# Patient Record
Sex: Female | Born: 1955
Health system: Southern US, Community
[De-identification: ages and names within clinical notes are randomized; demographics above are authoritative.]

## PROBLEM LIST (undated history)

## (undated) DIAGNOSIS — T7840XA Allergy, unspecified, initial encounter: Secondary | ICD-10-CM

## (undated) DIAGNOSIS — R112 Nausea with vomiting, unspecified: Secondary | ICD-10-CM

## (undated) DIAGNOSIS — Z9889 Other specified postprocedural states: Secondary | ICD-10-CM

## (undated) HISTORY — PX: OOPHORECTOMY: SHX86

## (undated) HISTORY — DX: Allergy, unspecified, initial encounter: T78.40XA

## (undated) HISTORY — PX: VAGINAL HYSTERECTOMY: SHX2639

---

## 1997-12-19 ENCOUNTER — Other Ambulatory Visit: Admission: RE | Admit: 1997-12-19 | Discharge: 1997-12-19 | Payer: Self-pay | Admitting: *Deleted

## 1998-01-15 ENCOUNTER — Ambulatory Visit (HOSPITAL_COMMUNITY): Admission: RE | Admit: 1998-01-15 | Discharge: 1998-01-15 | Payer: Self-pay | Admitting: *Deleted

## 1999-01-21 ENCOUNTER — Other Ambulatory Visit: Admission: RE | Admit: 1999-01-21 | Discharge: 1999-01-21 | Payer: Self-pay | Admitting: *Deleted

## 1999-01-24 ENCOUNTER — Ambulatory Visit (HOSPITAL_COMMUNITY): Admission: RE | Admit: 1999-01-24 | Discharge: 1999-01-24 | Payer: Self-pay | Admitting: *Deleted

## 1999-01-24 ENCOUNTER — Encounter: Payer: Self-pay | Admitting: *Deleted

## 2000-02-06 ENCOUNTER — Other Ambulatory Visit: Admission: RE | Admit: 2000-02-06 | Discharge: 2000-02-06 | Payer: Self-pay | Admitting: *Deleted

## 2000-02-06 ENCOUNTER — Ambulatory Visit (HOSPITAL_COMMUNITY): Admission: RE | Admit: 2000-02-06 | Discharge: 2000-02-06 | Payer: Self-pay | Admitting: *Deleted

## 2000-02-06 ENCOUNTER — Encounter: Payer: Self-pay | Admitting: *Deleted

## 2000-11-09 HISTORY — PX: BREAST BIOPSY: SHX20

## 2001-02-08 ENCOUNTER — Encounter: Payer: Self-pay | Admitting: *Deleted

## 2001-02-08 ENCOUNTER — Ambulatory Visit (HOSPITAL_COMMUNITY): Admission: RE | Admit: 2001-02-08 | Discharge: 2001-02-08 | Payer: Self-pay | Admitting: *Deleted

## 2001-02-18 ENCOUNTER — Other Ambulatory Visit: Admission: RE | Admit: 2001-02-18 | Discharge: 2001-02-18 | Payer: Self-pay | Admitting: *Deleted

## 2002-03-10 ENCOUNTER — Ambulatory Visit (HOSPITAL_COMMUNITY): Admission: RE | Admit: 2002-03-10 | Discharge: 2002-03-10 | Payer: Self-pay | Admitting: *Deleted

## 2002-03-10 ENCOUNTER — Encounter: Payer: Self-pay | Admitting: *Deleted

## 2002-05-02 ENCOUNTER — Other Ambulatory Visit: Admission: RE | Admit: 2002-05-02 | Discharge: 2002-05-02 | Payer: Self-pay | Admitting: *Deleted

## 2003-03-15 ENCOUNTER — Ambulatory Visit (HOSPITAL_COMMUNITY): Admission: RE | Admit: 2003-03-15 | Discharge: 2003-03-15 | Payer: Self-pay | Admitting: *Deleted

## 2003-03-15 ENCOUNTER — Encounter: Payer: Self-pay | Admitting: *Deleted

## 2003-05-08 ENCOUNTER — Other Ambulatory Visit: Admission: RE | Admit: 2003-05-08 | Discharge: 2003-05-08 | Payer: Self-pay | Admitting: *Deleted

## 2004-03-25 ENCOUNTER — Ambulatory Visit (HOSPITAL_COMMUNITY): Admission: RE | Admit: 2004-03-25 | Discharge: 2004-03-25 | Payer: Self-pay | Admitting: *Deleted

## 2004-05-08 ENCOUNTER — Other Ambulatory Visit: Admission: RE | Admit: 2004-05-08 | Discharge: 2004-05-08 | Payer: Self-pay | Admitting: *Deleted

## 2004-07-26 ENCOUNTER — Ambulatory Visit: Payer: Self-pay | Admitting: Family Medicine

## 2005-04-02 ENCOUNTER — Ambulatory Visit (HOSPITAL_COMMUNITY): Admission: RE | Admit: 2005-04-02 | Discharge: 2005-04-02 | Payer: Self-pay | Admitting: *Deleted

## 2005-06-04 ENCOUNTER — Other Ambulatory Visit: Admission: RE | Admit: 2005-06-04 | Discharge: 2005-06-04 | Payer: Self-pay | Admitting: *Deleted

## 2005-07-07 ENCOUNTER — Ambulatory Visit: Payer: Self-pay | Admitting: Internal Medicine

## 2005-09-23 ENCOUNTER — Ambulatory Visit: Payer: Self-pay | Admitting: Family Medicine

## 2005-11-25 ENCOUNTER — Ambulatory Visit: Payer: Self-pay | Admitting: Family Medicine

## 2006-04-28 ENCOUNTER — Ambulatory Visit (HOSPITAL_COMMUNITY): Admission: RE | Admit: 2006-04-28 | Discharge: 2006-04-28 | Payer: Self-pay | Admitting: *Deleted

## 2006-06-01 ENCOUNTER — Other Ambulatory Visit: Admission: RE | Admit: 2006-06-01 | Discharge: 2006-06-01 | Payer: Self-pay | Admitting: *Deleted

## 2006-10-19 ENCOUNTER — Ambulatory Visit: Payer: Self-pay | Admitting: Family Medicine

## 2007-02-16 ENCOUNTER — Ambulatory Visit: Payer: Self-pay | Admitting: Family Medicine

## 2007-02-16 DIAGNOSIS — J029 Acute pharyngitis, unspecified: Secondary | ICD-10-CM | POA: Insufficient documentation

## 2007-02-16 DIAGNOSIS — IMO0002 Reserved for concepts with insufficient information to code with codable children: Secondary | ICD-10-CM | POA: Insufficient documentation

## 2007-02-17 ENCOUNTER — Telehealth (INDEPENDENT_AMBULATORY_CARE_PROVIDER_SITE_OTHER): Payer: Self-pay | Admitting: *Deleted

## 2007-03-17 ENCOUNTER — Encounter: Payer: Self-pay | Admitting: Family Medicine

## 2007-03-22 ENCOUNTER — Encounter: Payer: Self-pay | Admitting: Family Medicine

## 2007-05-18 ENCOUNTER — Ambulatory Visit (HOSPITAL_COMMUNITY): Admission: RE | Admit: 2007-05-18 | Discharge: 2007-05-18 | Payer: Self-pay | Admitting: *Deleted

## 2007-05-26 ENCOUNTER — Other Ambulatory Visit: Admission: RE | Admit: 2007-05-26 | Discharge: 2007-05-26 | Payer: Self-pay | Admitting: *Deleted

## 2007-06-25 ENCOUNTER — Ambulatory Visit: Payer: Self-pay | Admitting: Internal Medicine

## 2007-06-25 DIAGNOSIS — J019 Acute sinusitis, unspecified: Secondary | ICD-10-CM | POA: Insufficient documentation

## 2007-07-16 ENCOUNTER — Telehealth (INDEPENDENT_AMBULATORY_CARE_PROVIDER_SITE_OTHER): Payer: Self-pay | Admitting: *Deleted

## 2007-07-16 ENCOUNTER — Encounter: Admission: RE | Admit: 2007-07-16 | Discharge: 2007-07-16 | Payer: Self-pay | Admitting: Family Medicine

## 2007-07-16 ENCOUNTER — Ambulatory Visit: Payer: Self-pay | Admitting: Family Medicine

## 2008-02-18 ENCOUNTER — Ambulatory Visit: Payer: Self-pay | Admitting: Family Medicine

## 2008-02-18 DIAGNOSIS — N390 Urinary tract infection, site not specified: Secondary | ICD-10-CM | POA: Insufficient documentation

## 2008-02-18 LAB — CONVERTED CEMR LAB
Bilirubin Urine: NEGATIVE
Ketones, urine, test strip: NEGATIVE
Nitrite: NEGATIVE
pH: 6

## 2008-02-21 ENCOUNTER — Telehealth (INDEPENDENT_AMBULATORY_CARE_PROVIDER_SITE_OTHER): Payer: Self-pay | Admitting: *Deleted

## 2008-04-28 ENCOUNTER — Ambulatory Visit: Payer: Self-pay | Admitting: *Deleted

## 2008-05-18 ENCOUNTER — Ambulatory Visit (HOSPITAL_COMMUNITY): Admission: RE | Admit: 2008-05-18 | Discharge: 2008-05-18 | Payer: Self-pay | Admitting: Family Medicine

## 2008-05-26 ENCOUNTER — Encounter (INDEPENDENT_AMBULATORY_CARE_PROVIDER_SITE_OTHER): Payer: Self-pay | Admitting: *Deleted

## 2008-06-12 ENCOUNTER — Encounter: Payer: Self-pay | Admitting: Family Medicine

## 2008-06-13 ENCOUNTER — Other Ambulatory Visit: Admission: RE | Admit: 2008-06-13 | Discharge: 2008-06-13 | Payer: Self-pay | Admitting: Family Medicine

## 2008-06-13 ENCOUNTER — Encounter: Payer: Self-pay | Admitting: Family Medicine

## 2008-06-13 ENCOUNTER — Ambulatory Visit: Payer: Self-pay | Admitting: Family Medicine

## 2008-06-13 DIAGNOSIS — J309 Allergic rhinitis, unspecified: Secondary | ICD-10-CM | POA: Insufficient documentation

## 2008-06-13 LAB — CONVERTED CEMR LAB
Glucose, Urine, Semiquant: NEGATIVE
Nitrite: NEGATIVE
Specific Gravity, Urine: <1.005
WBC Urine, dipstick: NEGATIVE
pH: 5

## 2008-06-13 LAB — HM PAP SMEAR: HM Pap smear: NEGATIVE

## 2008-06-16 ENCOUNTER — Encounter (INDEPENDENT_AMBULATORY_CARE_PROVIDER_SITE_OTHER): Payer: Self-pay | Admitting: *Deleted

## 2008-06-18 LAB — CONVERTED CEMR LAB
ALT: 17 units/L (ref 0–35)
BUN: 14 mg/dL (ref 6–23)
Basophils Relative: 0.3 % (ref 0.0–3.0)
Bilirubin, Direct: 0.1 mg/dL (ref 0.0–0.3)
CO2: 28 meq/L (ref 19–32)
Calcium: 8.8 mg/dL (ref 8.4–10.5)
Chloride: 104 meq/L (ref 96–112)
Cholesterol: 178 mg/dL (ref 0–200)
Creatinine, Ser: 0.8 mg/dL (ref 0.4–1.2)
Eosinophils Absolute: 0.1 10*3/uL (ref 0.0–0.7)
Eosinophils Relative: 2.2 % (ref 0.0–5.0)
GFR calc Af Amer: 97 mL/min
GFR calc non Af Amer: 80 mL/min
Glucose, Bld: 80 mg/dL (ref 70–99)
HCT: 44.1 % (ref 36.0–46.0)
Lymphocytes Relative: 37.3 % (ref 12.0–46.0)
MCHC: 35 g/dL (ref 30.0–36.0)
MCV: 97.2 fL (ref 78.0–100.0)
Monocytes Absolute: 0.4 10*3/uL (ref 0.1–1.0)
Platelets: 248 10*3/uL (ref 150–400)
Potassium: 4 meq/L (ref 3.5–5.1)
Total Bilirubin: 0.9 mg/dL (ref 0.3–1.2)
VLDL: 16 mg/dL (ref 0–40)

## 2008-08-23 ENCOUNTER — Telehealth: Payer: Self-pay | Admitting: Family Medicine

## 2008-08-30 ENCOUNTER — Telehealth (INDEPENDENT_AMBULATORY_CARE_PROVIDER_SITE_OTHER): Payer: Self-pay | Admitting: *Deleted

## 2008-09-19 ENCOUNTER — Ambulatory Visit: Payer: Self-pay | Admitting: Family Medicine

## 2008-12-04 ENCOUNTER — Ambulatory Visit: Payer: Self-pay | Admitting: Family Medicine

## 2008-12-04 DIAGNOSIS — N926 Irregular menstruation, unspecified: Secondary | ICD-10-CM | POA: Insufficient documentation

## 2008-12-07 ENCOUNTER — Encounter: Payer: Self-pay | Admitting: Internal Medicine

## 2008-12-14 ENCOUNTER — Ambulatory Visit: Payer: Self-pay | Admitting: Family Medicine

## 2008-12-14 DIAGNOSIS — N939 Abnormal uterine and vaginal bleeding, unspecified: Secondary | ICD-10-CM

## 2008-12-14 DIAGNOSIS — N926 Irregular menstruation, unspecified: Secondary | ICD-10-CM | POA: Insufficient documentation

## 2008-12-15 ENCOUNTER — Encounter: Payer: Self-pay | Admitting: Family Medicine

## 2008-12-17 LAB — CONVERTED CEMR LAB
Basophils Absolute: 0 10*3/uL (ref 0.0–0.1)
Basophils Relative: 0 % (ref 0.0–3.0)
HCT: 39.7 % (ref 36.0–46.0)
MCV: 97 fL (ref 78.0–100.0)
Monocytes Absolute: 0.3 10*3/uL (ref 0.1–1.0)
Neutrophils Relative %: 61.3 % (ref 43.0–77.0)
Platelets: 223 10*3/uL (ref 150.0–400.0)
RDW: 12.6 % (ref 11.5–14.6)
Transferrin: 301.9 mg/dL (ref 212.0–360.0)

## 2008-12-18 ENCOUNTER — Encounter (INDEPENDENT_AMBULATORY_CARE_PROVIDER_SITE_OTHER): Payer: Self-pay | Admitting: *Deleted

## 2009-02-26 ENCOUNTER — Ambulatory Visit (HOSPITAL_COMMUNITY): Admission: RE | Admit: 2009-02-26 | Discharge: 2009-02-27 | Payer: Self-pay | Admitting: Obstetrics and Gynecology

## 2009-02-26 ENCOUNTER — Encounter (INDEPENDENT_AMBULATORY_CARE_PROVIDER_SITE_OTHER): Payer: Self-pay | Admitting: Obstetrics and Gynecology

## 2009-09-06 ENCOUNTER — Ambulatory Visit: Payer: Self-pay | Admitting: Internal Medicine

## 2010-02-12 ENCOUNTER — Ambulatory Visit: Payer: Self-pay | Admitting: Family Medicine

## 2010-02-12 DIAGNOSIS — R209 Unspecified disturbances of skin sensation: Secondary | ICD-10-CM | POA: Insufficient documentation

## 2010-02-12 DIAGNOSIS — R609 Edema, unspecified: Secondary | ICD-10-CM | POA: Insufficient documentation

## 2010-02-13 ENCOUNTER — Encounter: Payer: Self-pay | Admitting: Family Medicine

## 2010-02-15 ENCOUNTER — Encounter: Payer: Self-pay | Admitting: Family Medicine

## 2010-02-15 ENCOUNTER — Telehealth (INDEPENDENT_AMBULATORY_CARE_PROVIDER_SITE_OTHER): Payer: Self-pay | Admitting: *Deleted

## 2010-02-15 LAB — CONVERTED CEMR LAB
AST: 26 units/L (ref 0–37)
Albumin: 3.8 g/dL (ref 3.5–5.2)
BUN: 18 mg/dL (ref 6–23)
Basophils Absolute: 0 10*3/uL (ref 0.0–0.1)
Bilirubin, Direct: 0.1 mg/dL (ref 0.0–0.3)
CO2: 28 meq/L (ref 19–32)
Calcium: 9 mg/dL (ref 8.4–10.5)
Chloride: 108 meq/L (ref 96–112)
Eosinophils Absolute: 0.1 10*3/uL (ref 0.0–0.7)
Eosinophils Relative: 2 % (ref 0.0–5.0)
Free T4: 0.91 ng/dL (ref 0.60–1.60)
GFR calc non Af Amer: 83.23 mL/min (ref >60)
Glucose, Bld: 83 mg/dL (ref 70–99)
Lymphs Abs: 1.9 10*3/uL (ref 0.7–4.0)
MCHC: 34.9 g/dL (ref 30.0–36.0)
Monocytes Relative: 14.6 % — ABNORMAL HIGH (ref 3.0–12.0)
Neutro Abs: 1.5 10*3/uL (ref 1.4–7.7)
Neutrophils Relative %: 36.8 % — ABNORMAL LOW (ref 43.0–77.0)
Potassium: 4.1 meq/L (ref 3.5–5.1)
RDW: 13.9 % (ref 11.5–14.6)
Sodium: 142 meq/L (ref 135–145)
TSH: 2.39 microintl units/mL (ref 0.35–5.50)
Total Protein: 6.7 g/dL (ref 6.0–8.3)

## 2010-02-19 ENCOUNTER — Telehealth (INDEPENDENT_AMBULATORY_CARE_PROVIDER_SITE_OTHER): Payer: Self-pay | Admitting: *Deleted

## 2010-02-19 LAB — CONVERTED CEMR LAB
Rhuematoid fact SerPl-aCnc: <20 intl units/mL (ref 0–20)
Vit D, 25-Hydroxy: 31 ng/mL (ref 30–89)

## 2010-02-26 ENCOUNTER — Ambulatory Visit: Payer: Self-pay | Admitting: Family Medicine

## 2010-02-27 DIAGNOSIS — R946 Abnormal results of thyroid function studies: Secondary | ICD-10-CM | POA: Insufficient documentation

## 2010-02-28 LAB — CONVERTED CEMR LAB: Free T4: 1.21 ng/dL (ref 0.60–1.60)

## 2010-03-14 ENCOUNTER — Ambulatory Visit: Payer: Self-pay | Admitting: Endocrinology

## 2010-03-14 DIAGNOSIS — E069 Thyroiditis, unspecified: Secondary | ICD-10-CM | POA: Insufficient documentation

## 2010-03-14 LAB — CONVERTED CEMR LAB: Vitamin B-12: 407 pg/mL (ref 211–911)

## 2010-03-15 LAB — CONVERTED CEMR LAB: Calcium, Total (PTH): 9.2 mg/dL (ref 8.4–10.5)

## 2010-04-08 ENCOUNTER — Encounter: Payer: Self-pay | Admitting: Family Medicine

## 2010-07-16 ENCOUNTER — Telehealth (INDEPENDENT_AMBULATORY_CARE_PROVIDER_SITE_OTHER): Payer: Self-pay | Admitting: *Deleted

## 2010-08-05 IMAGING — MG MM DIGITAL SCREENING BILAT
5 series · 5 of 5 positions shown · non-contrast
Comparison: none

DG SCREEN MAMMOGRAM BILATERAL
Bilateral CC and MLO view(s) were taken.
Technologist: Ebner, Ma Elena.(KRUEGER)(M)

DIGITAL SCREENING MAMMOGRAM WITH CAD:
The breast tissue is heterogeneously dense.  No masses or malignant type calcifications are 
identified.  Compared with prior studies.

[R CC (1 of 2)]
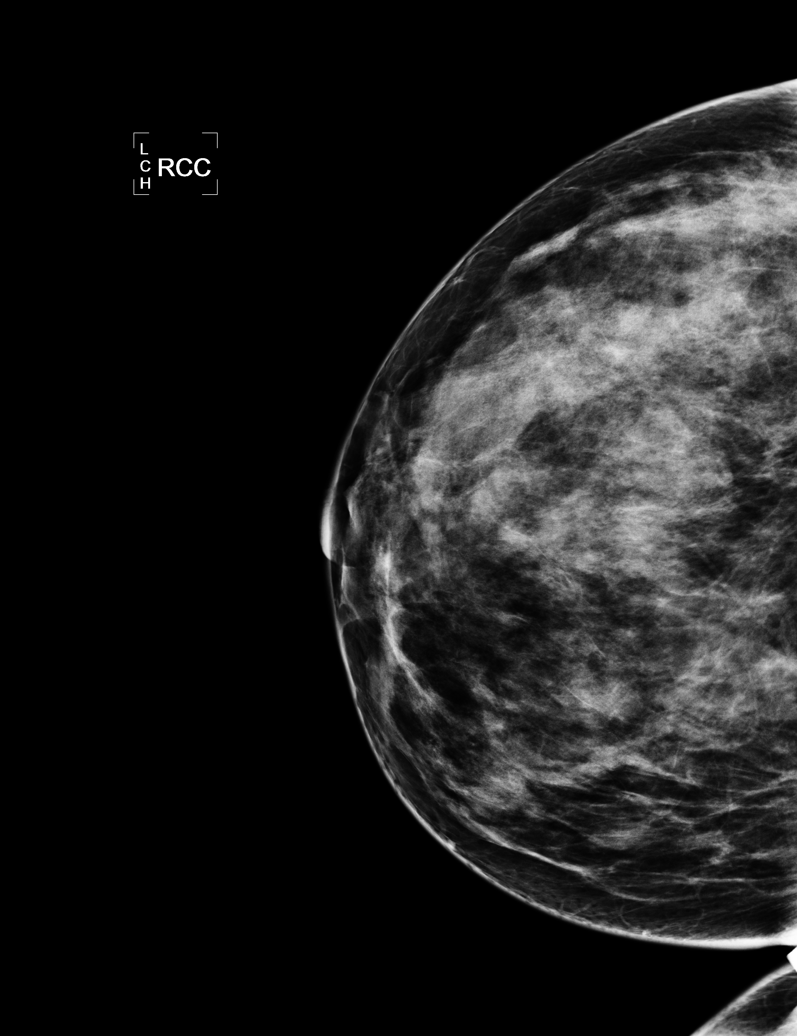

[R MLO]
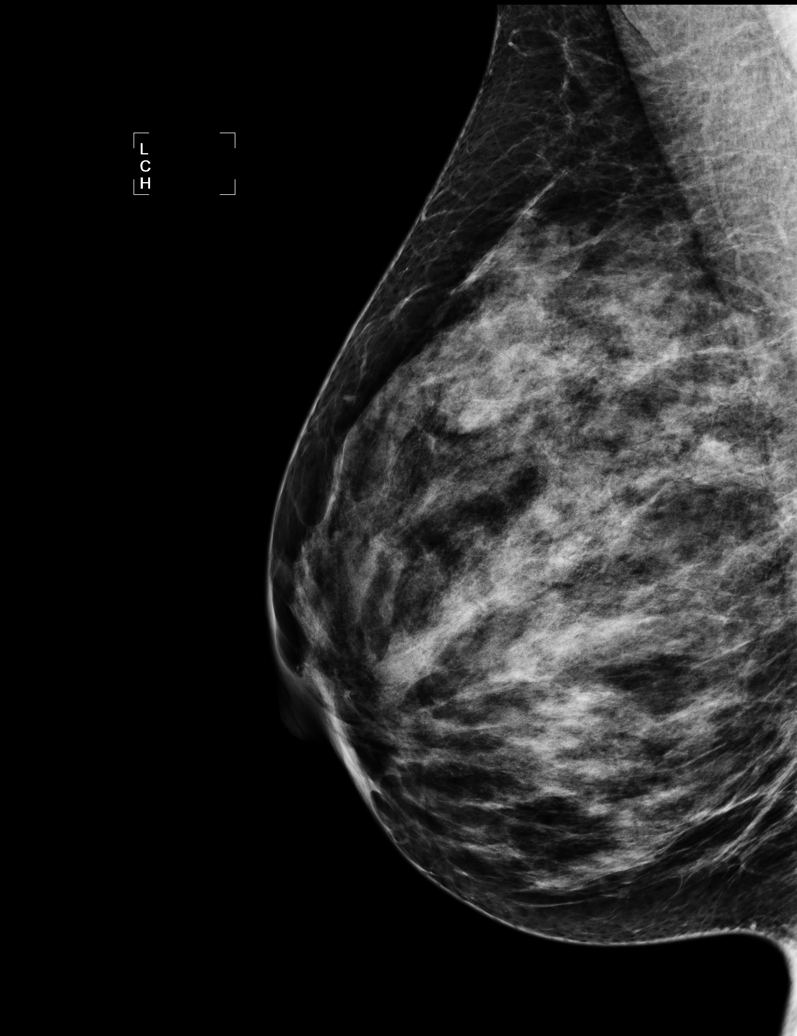

[L CC]
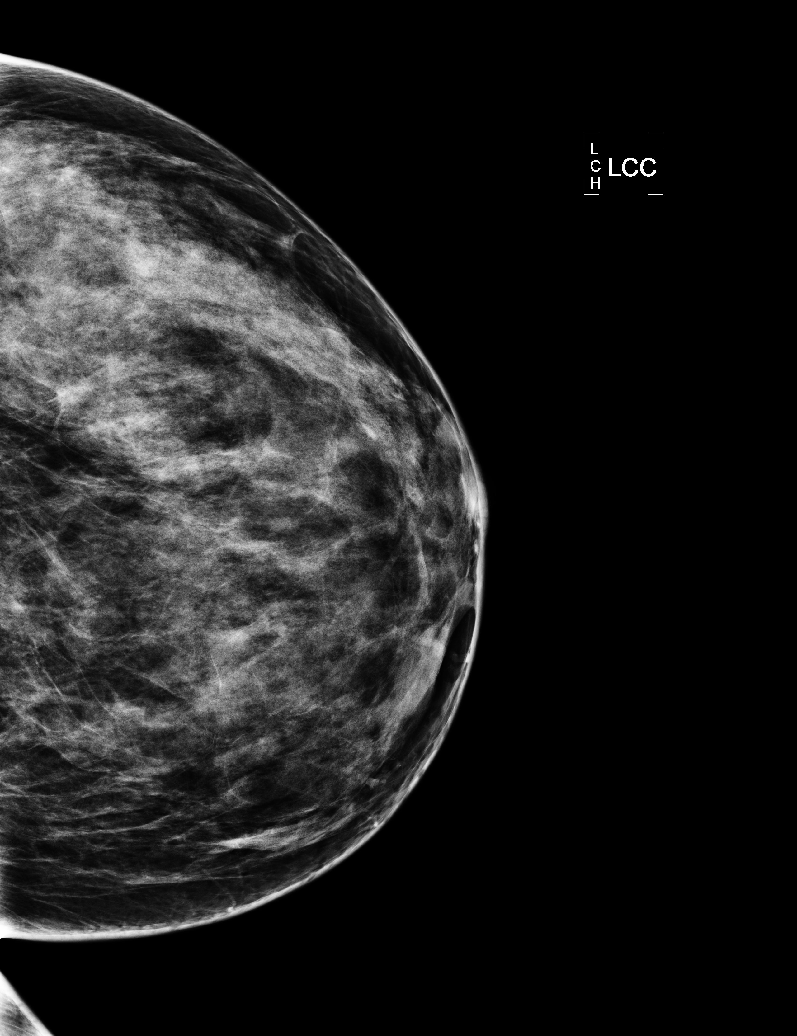

[L MLO]
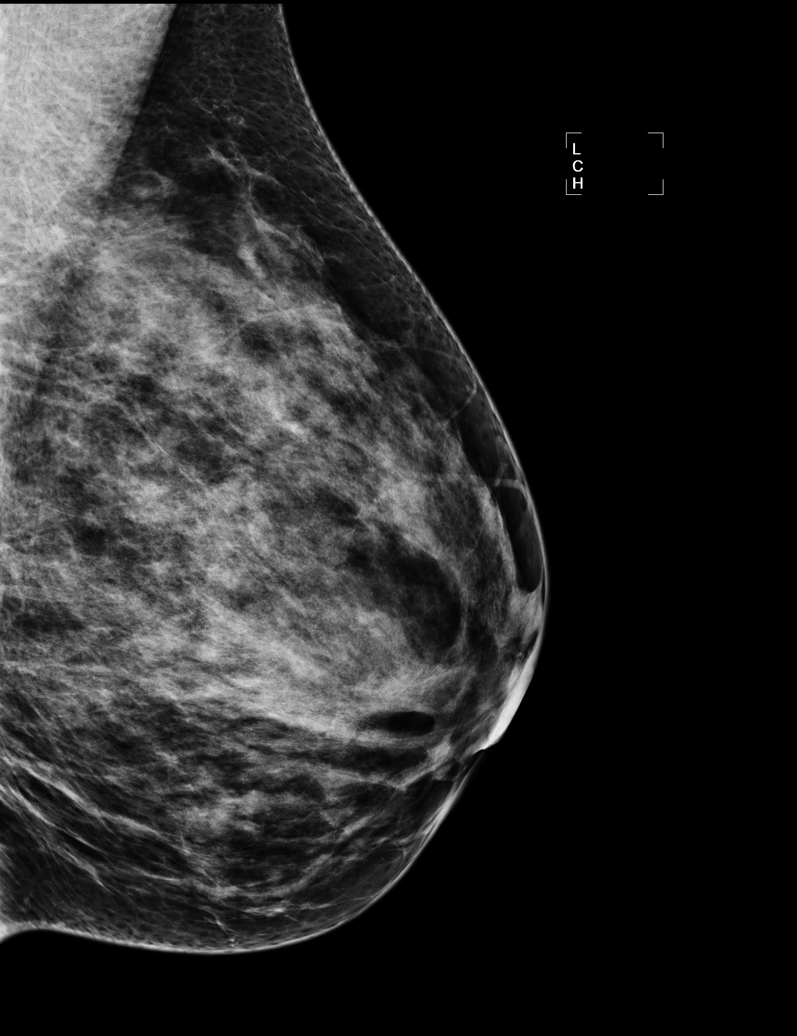

[R CC (2 of 2)]
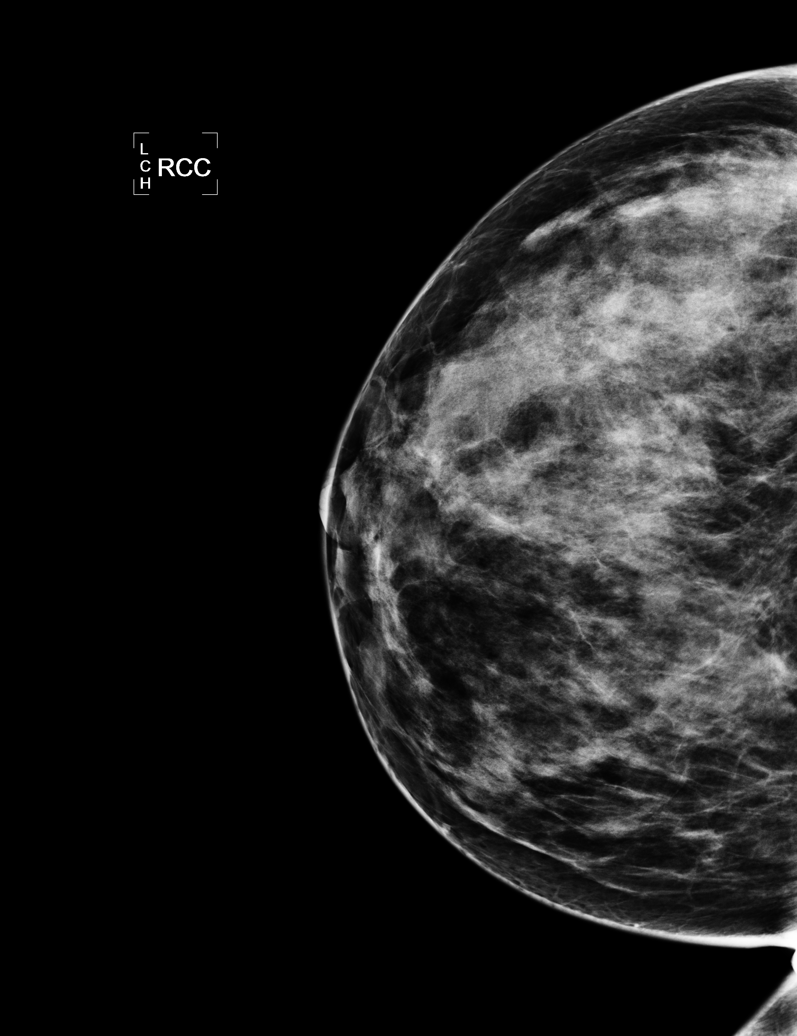

[5 of 5 positions shown; findings below may reference images not displayed]

IMPRESSION: No specific mammographic evidence of malignancy.  Next screening mammogram is recommended in one 
year.

ASSESSMENT: Negative - BI-RADS 1

Screening mammogram in 1 year.
ANALYZED BY COMPUTER AIDED DETECTION. , THIS PROCEDURE WAS A DIGITAL MAMMOGRAM.

## 2010-09-08 LAB — CONVERTED CEMR LAB
ALT: 16 units/L (ref 0–35)
AST: 18 units/L (ref 0–37)
Basophils Absolute: 0 10*3/uL (ref 0.0–0.1)
Basophils Relative: 0.5 % (ref 0.0–1.0)
Eosinophils Absolute: 0.1 10*3/uL (ref 0.0–0.6)
GFR calc Af Amer: 98 mL/min
Glucose, Bld: 89 mg/dL (ref 70–99)
HCT: 40.6 % (ref 36.0–46.0)
HDL: 55.4 mg/dL (ref >39.0)
Hemoglobin: 14.4 g/dL (ref 12.0–15.0)
LDL Cholesterol: 93 mg/dL (ref 0–99)
Monocytes Relative: 10 % (ref 3.0–11.0)
Neutro Abs: 2.7 10*3/uL (ref 1.4–7.7)
Neutrophils Relative %: 60.2 % (ref 43.0–77.0)
Nitrite: NEGATIVE
Pap Smear: NORMAL
Platelets: 283 10*3/uL (ref 150–400)
RBC: 4.3 M/uL (ref 3.87–5.11)
RDW: 12.5 % (ref 11.5–14.6)
Specific Gravity, Urine: <1.005
TSH: 1.56 microintl units/mL (ref 0.35–5.50)
Total Bilirubin: 0.7 mg/dL (ref 0.3–1.2)
Total CHOL/HDL Ratio: 2.9
Triglycerides: 72 mg/dL (ref 0–149)
WBC Urine, dipstick: NEGATIVE
WBC: 4.4 10*3/uL — ABNORMAL LOW (ref 4.5–10.5)

## 2010-09-10 NOTE — Assessment & Plan Note (Signed)
Summary: feet swelling / hand numb/cbs   Vital Signs:  Patient profile:   55 year old female Menstrual status:  irregular Height:      65.5 inches (166.37 cm) Weight:      174 pounds (79.09 kg) BMI:     28.62 O2 Sat:      99 % on Room air Temp:     98.6 degrees F (37.00 degrees C) oral Pulse rate:   71 / minute BP sitting:   136 / 80  (left arm)  Vitals Entered By: Lucious Groves (February 12, 2010 1:48 PM)  O2 Flow:  Room air CC: C/O bilateral foot swelling x1.5 months and right hand numbness/tingling x1.5 weeks./kb Is Patient Diabetic? No Pain Assessment Patient in pain? yes     Location: back/shoulder Intensity: 8 Type: throbbing Comments Patient notes that exercise has not helped her feet swelling, but her arm/hand issue is somewhat relieved  by ibuprofen./kb   History of Present Illness: Pt here c/o LE swelling x 1.5 months and more recently hands started swelling ( about 1 1/2 weeks ago).  No SOB, C P, fatigue.  No change in medication.   No drastic weight changes,  no dry hair / skin,  no constipation.  Pt also c/o numbness R hand in am.  Pt sleeps on side with arm curled under her.    Current Medications (verified): 1)  Zyrtec Allergy 10 Mg Tabs (Cetirizine Hcl) .Marland Kitchen.. 1 By Mouth Once Daily 2)  Astivella-Hrt .... As Directed 3)  Fluticasone Propionate 50 Mcg/act Susp (Fluticasone Propionate) .... Two Times A Day As Needed 4)  Astepro 0.15 % Soln (Azelastine Hcl) .... 2 Sprays Once Daily As Needed Allergies 5)  Aldactone 25 Mg Tabs (Spironolactone) .Marland Kitchen.. 1 By Mouth Once Daily  Allergies (verified): 1)  ! Penicillin 2)  ! Sulfa  Past History:  Past medical, surgical, family and social histories (including risk factors) reviewed for relevance to current acute and chronic problems.  Past Medical History: Reviewed history from 06/13/2008 and no changes required. Allergies Allergic rhinitis  Past Surgical History: Reviewed history from 01/21/2007 and no changes  required. Rt Breast bx- benign-11/2000  Family History: Reviewed history from 02/16/2007 and no changes required. Family History Breast cancer 1st degree relative - Mother 4 yo  Social History: Reviewed history from 06/13/2008 and no changes required. 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   + numbness R hand with flexion of wrist Extremities:  1+ left pedal edema, left pretibial edema, 1+ right pedal edema, and right pretibial edema.   Skin:  Intact without suspicious lesions or rashes Psych:  Cognition and judgment appear intact. Alert and cooperative with normal attention span and concentration. No apparent delusions, illusions, hallucinations   Impression & Recommendations:  Problem # 1:  EDEMA (ICD-782.3)  Her updated medication list for this problem includes:    Aldactone 25 Mg Tabs (Spironolactone) .Marland Kitchen... 1 by mouth once daily  Orders: Venipuncture (55732) TLB-TSH (Thyroid Stimulating Hormone) (84443-TSH) TLB-BMP (Basic Metabolic Panel-BMET) (80048-METABOL) TLB-CBC Platelet - w/Differential (85025-CBCD) TLB-Hepatic/Liver Function Pnl (80076-HEPATIC) TLB-Sedimentation Rate (ESR) (85652-ESR) TLB-T3, Free (Triiodothyronine) (84481-T3FREE) TLB-T4 (Thyrox), Free [phone]) T-Vitamin D (25-Hydroxy) (23762-83151)  Discussed elevation of the legs, use of compression stockings, sodium restiction, and medication use.  Problem # 2:  NUMBNESS, HAND (ICD-782.0)  Orders: Ankle / Wrist Splint (A4570)  Complete Medication List: 1)  Zyrtec Allergy 10 Mg Tabs (Cetirizine hcl) .Marland Kitchen.. 1 by mouth once daily 2)  Astivella-hrt  .... As directed 3)  Fluticasone Propionate 50 Mcg/act Susp  (Fluticasone propionate) .... Two times a day as needed 4)  Astepro 0.15 % Soln (Azelastine hcl) .... 2 sprays once daily as needed allergies 5)  Aldactone 25 Mg Tabs (Spironolactone) .Marland Kitchen.. 1 by mouth once daily  Patient Instructions: 1)  Please schedule a follow-up appointment in 2 weeks.  Prescriptions: ALDACTONE 25 MG TABS (SPIRONOLACTONE) 1 by mouth once daily  #30 x 2   Entered and Authorized by:   Loreen Freud DO   Signed by:   Loreen Freud DO on 02/12/2010   Method used:   [facility]  [address] [phone][facility]       [address]       [place], [place]  [address]       Ph: [phone] or [phone]       Fax: [phone]   RxID:   [id number]

## 2010-09-10 NOTE — Consult Note (Signed)
Summary: [facility]  [facility]   Imported By: Lanelle Bal 04/16/2010 12:42:40  _____________________________________________________________________  External Attachment:    Type:   Image     Comment:   External Document

## 2010-09-10 NOTE — Progress Notes (Signed)
Summary: Triage: Diuretic not helping  Phone Note Call from Patient Call back at Work Phone [phone]   Caller: Patient Summary of Call: Message left on VM: Patient started taking Diuretic and no change noted. Patient would like to know what more can be done   [facility]  February 15, 2010 11:09 AM   Follow-up for Phone Call        Is it just feet or still hands and feet?  Try increasing to 50 mg daily of aldactone. see labs.  We need to check some more labs.  Elevate feet--wear support hose / trouser socks OV in 1-2 weeks if no better   Follow-up by: Loreen Freud DO,  February 15, 2010 11:55 AM  Additional Follow-up for Phone Call Additional follow up Details #1::        Patient called back and left another message on VM saying after 2pm please call cell [phone] Additional Follow-up by: Shonna Chock,  February 15, 2010 1:39 PM    Additional Follow-up for Phone Call Additional follow up Details #2::    pt aware.............Marland KitchenFelecia Deloach CMA  February 15, 2010 2:21 PM

## 2010-09-10 NOTE — Progress Notes (Signed)
Summary: refill  Phone Note Refill Request Call back at [phone] Message from:  Patient  Refills Requested: Medication #1:  ASTEPRO 0.15 % SOLN 2 sprays once daily as needed [facility] [address]  Initial call taken by: Jeremy Johann CMA,  July 16, 2010 2:51 PM    Prescriptions: ASTEPRO 0.15 % SOLN (AZELASTINE HCL) 2 sprays once daily as needed allergies  #1 x 2   Entered by:   Almeta Monas CMA (AAMA)   Authorized by:   Loreen Freud DO   Signed by:   Almeta Monas CMA (AAMA) on 07/16/2010   Method used:   [facility]        [facility]  [address] [phone][facility] (retail)       [address]       [place], [place]  [address]       Ph: [phone] or [phone]       Fax: [phone]   RxID:   [id number]

## 2010-09-10 NOTE — Assessment & Plan Note (Signed)
Summary: 2 WEEK FOLLOWUP/KN   Vital Signs:  Patient profile:   55 year old female Menstrual status:  irregular Height:      65.5 inches Weight:      168 pounds Temp:     98.5 degrees F oral Pulse rate:   78 / minute BP sitting:   118 / 76  (left arm)  Vitals Entered By: Jeremy Johann CMA (February 26, 2010 4:11 PM) CC: 2 week f/u, swelling and arm issue improving   History of Present Illness: Pt here tof/u from last visit.  swelling is much better.  Her R arm is still going numb even with splint.     Current Medications (verified): 1)  Zyrtec Allergy 10 Mg Tabs (Cetirizine Hcl) .Marland Kitchen.. 1 By Mouth Once Daily 2)  Astivella-Hrt .... As Directed 3)  Fluticasone Propionate 50 Mcg/act Susp (Fluticasone Propionate) .... Two Times A Day As Needed 4)  Astepro 0.15 % Soln (Azelastine Hcl) .... 2 Sprays Once Daily As Needed Allergies 5)  Aldactone 50 Mg Tabs (Spironolactone) .Marland Kitchen.. 1 By Mouth Once Daily  Allergies (verified): 1)  ! Penicillin 2)  ! Sulfa  Past History:  Past medical, surgical, family and social histories (including risk factors) reviewed for relevance to current acute and chronic problems.  Past Medical History: Reviewed history from 06/13/2008 and no changes required. Allergies Allergic rhinitis  Past Surgical History: Reviewed history from 01/21/2007 and no changes required. Rt Breast bx- benign-11/2000  Family History: Reviewed history from 02/16/2007 and no changes required. Family History Breast cancer 1st degree relative - Mother 47 yo  Social History: Reviewed history from 06/13/2008 and no changes required. 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   Psych:  Cognition and judgment appear intact. Alert and cooperative with normal attention span and concentration. No apparent delusions, illusions, hallucinations   Impression & Recommendations:  Problem # 1:  NUMBNESS, HAND (ICD-782.0) con't with splint Orders: Orthopedic Surgeon Referral (Ortho Surgeon) Venipuncture [phone]) TLB-T3, Free (Triiodothyronine) (84481-T3FREE) TLB-T4 (Thyrox), Free [phone]) TLB-TSH (Thyroid Stimulating Hormone) (84443-TSH) T- * Misc. Laboratory test [phone]) Specimen Handling (78469)  Problem # 2:  EDEMA (ICD-782.3) Assessment: Improved  The following medications were removed from the medication list:    Aldactone 25 Mg Tabs (Spironolactone) .Marland Kitchen... 1 by mouth once daily Her updated medication list for this problem includes:    Aldactone 50 Mg Tabs (Spironolactone) .Marland Kitchen... 1 by mouth once daily  Orders: Venipuncture (62952) TLB-T3, Free (Triiodothyronine) (84481-T3FREE) TLB-T4 (Thyrox), Free [phone]) TLB-TSH (Thyroid Stimulating Hormone) (84443-TSH) T- * Misc. Laboratory test [phone])  Discussed elevation of the legs, use of compression stockings, sodium restiction, and medication use.   Complete Medication List: 1)  Zyrtec Allergy 10 Mg Tabs (Cetirizine hcl) .Marland Kitchen.. 1 by mouth once daily 2)  Astivella-hrt  .... As directed 3)  Fluticasone Propionate 50 Mcg/act Susp (Fluticasone propionate) .... Two times a day as needed 4)  Astepro 0.15 % Soln (Azelastine hcl) .... 2 sprays once daily as needed allergies 5)  Aldactone 50 Mg Tabs (Spironolactone) .Marland Kitchen.. 1 by mouth once daily Prescriptions: ALDACTONE 50 MG TABS (SPIRONOLACTONE) 1 by mouth once daily  #30 x 2   Entered and Authorized by:  Loreen Freud DO   Signed by:   Loreen Freud DO on 02/26/2010   Method used:   [facility]        [facility]  [address] [phone]* (retail)       [address]       [place], [place]  [address]       Ph: [phone] or [phone]       Fax: [phone]   RxID:   [id number]

## 2010-09-10 NOTE — Assessment & Plan Note (Signed)
Summary: NEW ENDO [facility] ABNORM THY AND HEAD NUMBNESS PT-PER LIZ/DR LOWNE.Marland KitchenMarland Kitchen   Vital Signs:  Patient profile:   55 year old female Menstrual status:  irregular Height:      65.5 inches (166.37 cm) Weight:      167.13 pounds (75.97 kg) BMI:     27.49 O2 Sat:      98 % on Room air Temp:     98.2 degrees F (36.78 degrees C) oral Pulse rate:   76 / minute Pulse rhythm:   regular BP sitting:   118 / 74  (left arm) Cuff size:   regular  Vitals Entered By: Brenton Grills MA (March 14, 2010 4:01 PM)  O2 Flow:  Room air CC: New Endo/Abn Thyroid and Head Numbness/aj   Primary Provider:  Laury Axon  CC:  New Endo/Abn Thyroid and Head Numbness/aj.  History of Present Illness: 2 mos of moderate swelling of the legs, but no associated sob.  increasing activity did not help.  she started aldactone, with improvement in her swelling.    Current Medications (verified): 1)  Zyrtec Allergy 10 Mg Tabs (Cetirizine Hcl) .Marland Kitchen.. 1 By Mouth Once Daily 2)  Astivella-Hrt .... As Directed 3)  Fluticasone Propionate 50 Mcg/act Susp (Fluticasone Propionate) .... Two Times A Day As Needed 4)  Astepro 0.15 % Soln (Azelastine Hcl) .... 2 Sprays Once Daily As Needed Allergies 5)  Aldactone 50 Mg Tabs (Spironolactone) .Marland Kitchen.. 1 By Mouth Once Daily  Allergies (verified): 1)  ! Penicillin 2)  ! Sulfa  Past History:  Past Medical History: Last updated: 06/13/2008 Allergies Allergic rhinitis  Family History: Reviewed history from 02/16/2007 and no changes required. Family History Breast cancer 1st degree relative - Mother 42 yo Hypertension (Parents) Stroke (Parents) no thyroid dz  Social History: Reviewed history from 06/13/2008 and no changes required. 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         she now has acral numbness, and assoc pain. denies depression,  hair loss, sob, weight gain, memory loss, constipation, seizure, blurry vision, syncope.  she has mildly dry skin, and myalgias.  she has a few cramps of the toes.    Physical Exam  General:  normal appearance.   Head:  head: no deformity eyes: no periorbital swelling, no proptosis external nose and ears are normal mouth: no lesion seen Neck:  thyroid is minimally elnlarged, if at all.  no noduel Lungs:  Clear to auscultation bilaterally. Normal respiratory effort.  Heart:  Regular rate and rhythm without murmurs or gallops noted. Normal S1,S2.   Abdomen:  abdomen is soft, nontender.  no hepatosplenomegaly.   not distended.  no hernia  Msk:  muscle bulk and strength are grossly normal.  no obvious joint swelling.  gait is normal and steady  Pulses:  dorsalis pedis intact bilat.   Extremities:  no deformity.  no ulcer on the feet.  feet are of normal color and temp.  no edema  Neurologic:  cn 2-12 grossly intact.   readily moves all 4's.   sensation is intact to touch on the feet  Skin:  normal texture and temp.  no rash.  not diaphoretic  Cervical Nodes:  No significant adenopathy.  Psych:  Alert and cooperative; normal mood and affect; normal attention span and concentration.   Additional Exam:  Parathyroid Hormone  [L]  11.6 pg/mL  14.0-72.0 Calcium                   9.2 mg/dL    Thyroid Peroxidase (TPO) Ab       [H]  66.1 U/mL       Impression & Recommendations:  Problem # 1:  THYROIDITIS (ICD-245.9) mild, but she is at risk for the eventual development of hypothyroidism  Problem # 2:  EDEMA (ICD-782.3) not related to #1  Problem # 3:  NUMBNESS, HAND (ICD-782.0) uncertain etiology  Other Orders: T-Parathyroid Hormone, Intact w/ Calcium (59563-87564) TLB-B12, Serum-Total ONLY (33295-J88) Consultation Level IV (41660)  Patient Instructions: 1)  at a minimum, you should have annually:  thyroid blood test and physical examination of the thyroid. 2)   blood tests are being ordered for you today.  please call [phone] to hear your test results. 3)  because of you autoimmunity, future symptoms you have should be interpreted in that context.   4)  cc dr Vincente Poli

## 2010-09-10 NOTE — Assessment & Plan Note (Signed)
Summary: SINUS INF/RH......Marland Kitchen   Vital Signs:  Patient profile:   55 year old female Menstrual status:  irregular Weight:      171 pounds Temp:     98.3 degrees F oral Pulse rate:   66 / minute Resp:     14 per minute BP sitting:   104 / 70  (left arm)  Vitals Entered By: Doristine Devoid (September 06, 2009 1:53 PM) CC: sinus infection sx x1 wk some cough but mainly dry using meds but no relief , URI symptoms   Primary Care Provider:  Laury Axon  CC:  sinus infection sx x1 wk some cough but mainly dry using meds but no relief  and URI symptoms.  History of Present Illness:  URI Symptoms      This is a 55 year old woman who presents with URI symptoms as head congestion since 09/01/2009.  The patient reports nasal congestion, clear nasal discharge, productive cough with thick , yellow sputum, earache, and sick contacts(daughter &G daughter), but denies sore throat.  The patient denies fever, stiff neck, dyspnea, wheezing, rash, vomiting, and diarrhea.  The patient denies itchy watery eyes, sneezing, muscle aches, and severe fatigue.  Risk factors for Strep sinusitis include bilateral facial pain, tooth pain, and tender adenopathy.  The patient denies the following risk factors for Strep sinusitis: Strep exposure.  Rx: Neti pot , Zyrtec, Astelin, Sudafed, Dayquil  Allergies: 1)  ! Penicillin 2)  ! Sulfa  Physical Exam  General:  Appears tired,in no acute distress; alert,appropriate and cooperative throughout examination Ears:  External ear exam shows no significant lesions or deformities.  Otoscopic examination reveals clear canals, tympanic membranes are intact bilaterally without bulging, retraction, inflammation or discharge. Hearing is grossly normal bilaterally. Nose:  External nasal examination shows no deformity or inflammation. Nasal mucosa are dry  without lesions or exudates. Hyponasal speech Mouth:  Oral mucosa and oropharynx without lesions or exudates.  Teeth in good repair. Lungs:   Normal respiratory effort, chest expands symmetrically. Lungs are clear to auscultation, no crackles or wheezes. Cervical Nodes:  Shotty LA R > L Axillary Nodes:  No palpable lymphadenopathy   Impression & Recommendations:  Problem # 1:  BRONCHITIS-ACUTE (ICD-466.0)  Her updated medication list for this problem includes:    Clarithromycin 500 Mg Xr24h-tab (Clarithromycin) .Marland Kitchen... 2 once daily with a meal  Problem # 2:  SINUSITIS- ACUTE-NOS (ICD-461.9)  The following medications were removed from the medication list:    Astelin Soln (Azelastine hcl soln) .Marland Kitchen... 1-2 sprays bid    Flonase 50 Mcg/act Susp (Fluticasone propionate) .Marland Kitchen... 2 sprays each nostril once daily Her updated medication list for this problem includes:    Clarithromycin 500 Mg Xr24h-tab (Clarithromycin) .Marland Kitchen... 2 once daily with a meal    Fluticasone Propionate 50 Mcg/act Susp (Fluticasone propionate) .Marland Kitchen..Marland Kitchen Two times a day as needed    Astepro 0.15 % Soln (Azelastine hcl) .Marland Kitchen... 2 sprays once daily as needed allergies  Complete Medication List: 1)  Zyrtec Allergy 10 Mg Tabs (Cetirizine hcl) .Marland Kitchen.. 1 by mouth once daily 2)  Astivella-hrt  .... As directed 3)  Clarithromycin 500 Mg Xr24h-tab (Clarithromycin) .... 2 once daily with a meal 4)  Fluticasone Propionate 50 Mcg/act Susp (Fluticasone propionate) .... Two times a day as needed 5)  Astepro 0.15 % Soln (Azelastine hcl) .... 2 sprays once daily as needed allergies  Patient Instructions: 1)  Drink as much fluid as you can tolerate for the next few days. Continue head congestion has  resolved. Stop decongestants. Crossover with Fluticasone spray two times a day  Prescriptions: ASTEPRO 0.15 % SOLN (AZELASTINE HCL) 2 sprays once daily as needed allergies  #1 x 11   Entered and Authorized by:   Marga Melnick MD   Signed by:   Marga Melnick MD on 09/06/2009   Method used:   Print then Give to Patient   RxID:   [id number] FLUTICASONE PROPIONATE 50 MCG/ACT SUSP  (FLUTICASONE PROPIONATE) two times a day as needed  #1 x 11   Entered and Authorized by:   Marga Melnick MD   Signed by:   Marga Melnick MD on 09/06/2009   Method used:   [facility] ...       [facility]  [address] [phone][facility] (retail)       [address]       [place], [place]  [address]       Ph: [phone] or [phone]       Fax: [phone]   RxID:   [id number] CLARITHROMYCIN 500 MG XR24H-TAB (CLARITHROMYCIN) 2 once daily with a meal  #20 x 0   Entered and Authorized by:   Marga Melnick MD   Signed by:   Marga Melnick MD on 09/06/2009   Method used:   [facility]ed to ...       [facility]  [address] [phone]* (retail)       [address]       [place], [place]  [address]       Ph: [phone] or [phone]       Fax: [phone]   RxID:   [id number]

## 2010-09-10 NOTE — Progress Notes (Signed)
Summary: -lab result  Phone Note Outgoing Call   Call placed by: [facility] CMA,  February 19, 2010 8:22 AM Details for Reason: low normal----take vita D3 1000u daily---if already doing that increase to 2000u daily recheck 3 months  RA negative ANA positive but only weakly if symptoms persist ---refer to rheum  Summary of Call: left message to call office...................Marland KitchenFelecia Deloach CMA  February 19, 2010 8:22 AM   Follow-up for Phone Call        DISCUSS WITH PATIENT.................Marland KitchenFelecia Deloach CMA  February 19, 2010 8:30 AM

## 2010-09-13 ENCOUNTER — Telehealth (INDEPENDENT_AMBULATORY_CARE_PROVIDER_SITE_OTHER): Payer: Self-pay | Admitting: *Deleted

## 2010-09-18 NOTE — Progress Notes (Signed)
Summary: Refill Request  Phone Note Refill Request Call back at [phone] Message from:  Pharmacy on September 13, 2010 8:41 AM  Refills Requested: Medication #1:  FLUTICASONE PROPIONATE 50 MCG/ACT SUSP two times a day as needed   Dosage confirmed as above?Dosage Confirmed   Supply Requested: 16   [facility] [address]   Next Appointment Scheduled: none Initial call taken by: Harold Barban,  September 13, 2010 8:41 AM    Prescriptions: FLUTICASONE PROPIONATE 50 MCG/ACT SUSP (FLUTICASONE PROPIONATE) two times a day as needed  #1 x 11   Entered by:   Almeta Monas CMA (AAMA)   Authorized by:   Loreen Freud DO   Signed by:   Almeta Monas CMA (AAMA) on 09/13/2010   Method used:   [facility]        [facility]  [address] [phone]* (retail)       [address]       [place], [place]  [address]       Ph: [phone] or [phone]       Fax: [phone]   RxID:   [id number]

## 2010-11-17 LAB — CBC
HCT: 31.6 % — ABNORMAL LOW (ref 36.0–46.0)
HCT: 40.9 % (ref 36.0–46.0)
Hemoglobin: 11.1 g/dL — ABNORMAL LOW (ref 12.0–15.0)
MCHC: 35.1 g/dL (ref 30.0–36.0)
MCV: 95 fL (ref 78.0–100.0)
Platelets: 184 10*3/uL (ref 150–400)
Platelets: 255 10*3/uL (ref 150–400)
RBC: 4.31 MIL/uL (ref 3.87–5.11)
RDW: 13.4 % (ref 11.5–15.5)
WBC: 6.8 10*3/uL (ref 4.0–10.5)

## 2010-11-17 LAB — PREGNANCY, URINE: Preg Test, Ur: NEGATIVE

## 2010-12-24 NOTE — Op Note (Signed)
NAMECATHRYN, [name]                 ACCOUNT NO.:  [id number]   MEDICAL RECORD NO.:  [id number]          PATIENT TYPE:  OIB   LOCATION:  9305                          FACILITY:  [facility]   PHYSICIAN:  Michelle L. Grewal, M.D.DATE OF BIRTH:  Jan 12, 1956   DATE OF PROCEDURE:  02/26/2009  DATE OF DISCHARGE:                               OPERATIVE REPORT   PREOPERATIVE DIAGNOSES:  Pelvic pain, menorrhagia, and fibroids.   POSTOPERATIVE DIAGNOSES:  Pelvic pain, menorrhagia, and fibroids.   PROCEDURE:  LAVH and BSO.   SURGEON:  Michelle L. Vincente Poli, MD   ASSISTANT:  Dineen Kid. Rana Snare, MD   SPECIMENS:  Uterus, cervix, tubes, and ovaries sent to Pathology.   ESTIMATED BLOOD LOSS:  200 mL.   COMPLICATIONS:  None.   PROCEDURE:  The patient was taken to the operating room after informed  consent was obtained.  She was then prepped and draped in the usual  sterile fashion.  Foley catheter was inserted and draining clear urine.  Uterine manipulator was inserted.  She had no evidence of a cystocele on  exam.  Attention was turned to the abdomen where a small infraumbilical  incision was made.  The Veress needle was inserted, and peritoneum was  performed.  The 11-mm trocar was then inserted, and the patient was  placed in Trendelenburg position.  A 5-mm trocar was then placed  suprapubically.  The attention was turned to the abdomen.  The pelvis  was inspected.  The ovaries were normal.  No adhesions or endometriosis  was noted.  The uterus was enlarged, and there were 3 several large  fibroids, it made her uterus kind of shape like a Mickey Mouse.  I then  identified the ureters which were deep in the pelvis, placed an  atraumatic grasper across the left tube and ovary.  I elevated it,  identified the infundibulopelvic ligament, placed the EnSeal instrument  across the IP ligament just beneath the ovary and burned and transected  that and carried it down to the round ligament.  We avoided the  ureter.  The ureter was peristalsing normally afterwards, and hemostasis was  excellent.  We did this identically on the right side as well.  At this  point, we then released the pneumoperitoneum, kept the trocars in, and  down vaginally.  I placed a weighted speculum in the vagina.  Dr. Rana Snare  scrubbed in.  A circumferential incision was made around the cervix.  The posterior cul-de-sac was entered sharply using Mayo scissors.  A  weighted speculum was placed in the cul-de-sac.  The anterior cul-de-sac  was entered sharply as well.  Curved Heaney clamps were placed across  each uterosacral cardinal ligament.  Each pedicle was clamped, cut, and  suture ligated using 0 Vicryl suture.  We walked our way up the broad  ligament and staying just snug next to the lower part of the cervix and  the uterus.  Each pedicle was clamped, cut, and suture ligated using 0  Vicryl suture.  Once we reached the level of the fundus, the uterus was  retroflexed.  The remainder of the broad ligament was clamped on either  side.  The specimen was removed, and it was identified as uterus,  cervix, tubes, and ovaries and sent to Pathology.  The pedicles were  secured using a suture ligature of 0 Vicryl suture.  The posterior cuff  was closed using a running stitch from 3-9 o'clock using 0 Vicryl  suture.  The cuff was closed completely anterior to posterior using 0  Vicryl suture.  Hemostasis was excellent.  We then went back up to the  abdomen.  Urine was noted to be clear according to Anesthesia.  I  replaced the pneumoperitoneum, placed the patient Trendelenburg.  There  was no bleeding and no blood in the pelvis at all.  The pneumoperitoneum  was released.  The trocars were removed.  The infraumbilical incision  was closed using 0 Vicryl stitch, and the skin was closed at each side  using Dermabond.  All sponge, lap, and instrument counts were correct  x2.  The patient went to recovery room in stable  condition.      Michelle L. Vincente Poli, M.D.  Electronically Signed     MLG/MEDQ  D:  02/26/2009  T:  02/26/2009  Job:  [id number]

## 2010-12-24 NOTE — H&P (Signed)
[name]                 ACCOUNT NO.:  [id number]   MEDICAL RECORD NO.:  [id number]          PATIENT TYPE:  AMB   LOCATION:                                FACILITY:  [facility]   PHYSICIAN:  Michelle L. Grewal, M.D.DATE OF BIRTH:  26-Sep-1955   DATE OF ADMISSION:  02/26/2009  DATE OF DISCHARGE:                              HISTORY & PHYSICAL   This patient is a 55 year old, G1, P86, LMP on November 19, 2008, presents  today preoperative diagnosis.  She is scheduled for LAVH-BSO, possible  anterior repair.  This patient I originally saw on Dec 15 2008, as a new  patient.  She had complained of abnormal bleeding since November 19, 2008.  She states in January she got a letter from her insurance company  telling her to change her birth control pills.  She then changed to  Ortho-Novum 7/7/7.  On November 19, 2008, she started having bleeding for 3  weeks.  She changed back to Ovcon 35 and bled even heavier, and at that  time she was also developing some right-sided abdominal pain.  I saw her  on Dec 15, 2008, and performed an endometrial biopsy.  We also drew a  FSH.  The endometrial biopsy revealed scanty fragments of benign  proliferative endometrium.  No hyperplasia identified.  Her FSH was 8 at  that time.  I then had her come back for an ultrasound which was  performed on Jan 07, 2009.  The ultrasound showed multiple intramural  fibroids measuring from 14 mm to 3.5 cm, one was abutting the  endometrium.  There was a subserosal left fibroid seen measuring 4.6 x  4.1 cm.  No adnexal masses or free fluid seen.  The patient was given  options and had elected to undergo hysterectomy.  She also reports some  leakage of urine with sneezing and coughing, and we will possibly do an  anterior repair if it looks like she has some relaxation of the anterior  vaginal vault.  Her medical history is unremarkable.   FAMILY HISTORY:  Her mother had breast cancer.   Medications include Zyrtec, Flonase,  Vicodin as needed for pain, and  Ambien.   She had only has seasonal allergies.   SOCIAL HISTORY:  She is not a smoker.  She denies any alcohol use.  She  is currently married.  Her primary care doctor is Dr. Loreen Freud.   OPERATION:  She has had a vaginal delivery in 1982, and she had a  history of a breast cyst removed in 1980.   Her review of systems is positive for insomnia, hot flashes, and lower  abdominal pain on the right side and irregular bleeding.   PHYSICAL EXAMINATION:  VITAL SIGNS:  Height 5 feet 4-1/2 inches, weight  170, BP 118/78.  GENERAL:  She is alert and oriented.  LUNGS:  Clear to auscultation bilaterally.  CARDIAC:  Regular rate and rhythm.  BREASTS:  Soft, nontender.  No masses.  PELVIC:  External genitalia within normal limits.  Vulva and vagina  appear normal.  Cervix, no lesions.  Bimanual exam, uterus is myomatous.  She does have some tenderness on the right lower quadrant.  No adnexal  masses noted.   IMPRESSION:  Abnormal uterine bleeding and pelvic pain and uterine  fibroids.   PLAN:  We will proceed with LAVH and BSO and possible anterior repair.  Risk and benefits have been reviewed with the patient, and she had been  counseled on the risk associated with surgery which include the risk of  anesthesia, risk of injury to internal organs, risk of infection, risk  of bleeding, risk of DVT.  She will proceed with surgery.      Michelle L. Vincente Poli, M.D.  Electronically Signed     MLG/MEDQ  D:  02/14/2009  T:  02/15/2009  Job:  [id number]

## 2011-02-24 ENCOUNTER — Encounter: Payer: Self-pay | Admitting: Family Medicine

## 2011-02-25 ENCOUNTER — Ambulatory Visit (INDEPENDENT_AMBULATORY_CARE_PROVIDER_SITE_OTHER): Payer: 59 | Admitting: Family Medicine

## 2011-02-25 ENCOUNTER — Encounter: Payer: Self-pay | Admitting: Family Medicine

## 2011-02-25 VITALS — BP 110/74 | HR 79 | Temp 98.8°F | Ht 64.75 in | Wt 166.8 lb

## 2011-02-25 DIAGNOSIS — Z0000 Encounter for general adult medical examination without abnormal findings: Secondary | ICD-10-CM

## 2011-02-25 LAB — BASIC METABOLIC PANEL
Chloride: 107 mEq/L (ref 96–112)
GFR: 89.59 mL/min (ref >60.00)
Glucose, Bld: 93 mg/dL (ref 70–99)
Potassium: 4.2 mEq/L (ref 3.5–5.1)
Sodium: 140 mEq/L (ref 135–145)

## 2011-02-25 LAB — CBC WITH DIFFERENTIAL/PLATELET
Basophils Relative: 0.5 % (ref 0.0–3.0)
Hemoglobin: 13.1 g/dL (ref 12.0–15.0)
Lymphocytes Relative: 41.5 % (ref 12.0–46.0)
MCHC: 34.2 g/dL (ref 30.0–36.0)
MCV: 95.9 fl (ref 78.0–100.0)
Neutrophils Relative %: 44.5 % (ref 43.0–77.0)
Platelets: 212 10*3/uL (ref 150.0–400.0)
RDW: 13.3 % (ref 11.5–14.6)
WBC: 3 10*3/uL — ABNORMAL LOW (ref 4.5–10.5)

## 2011-02-25 LAB — HEPATIC FUNCTION PANEL
ALT: 20 U/L (ref 0–35)
AST: 24 U/L (ref 0–37)
Albumin: 3.8 g/dL (ref 3.5–5.2)
Total Protein: 6.8 g/dL (ref 6.0–8.3)

## 2011-02-25 LAB — LIPID PANEL
Cholesterol: 158 mg/dL (ref 0–200)
LDL Cholesterol: 81 mg/dL (ref 0–99)
VLDL: 7.2 mg/dL (ref 0.0–40.0)

## 2011-02-25 NOTE — Patient Instructions (Signed)

## 2011-02-25 NOTE — Progress Notes (Signed)
  Subjective:     [name] is a 55 y.o. female and is here for a comprehensive physical exam. The patient reports no problems.  History   Social History  . Marital Status: Married    Spouse Name: N/A    Number of Children: N/A  . Years of Education: N/A   Occupational History  . investment banking    Social History Main Topics  . Smoking status: Never Smoker   . Smokeless tobacco: Not on file  . Alcohol Use: 0.0 oz/week     rare  . Drug Use: No  . Sexually Active: Yes -- Female partner(s)   Other Topics Concern  . Not on file   Social History Narrative  . No narrative on file   Health Maintenance  Topic Date Due  . Influenza Vaccine  05/12/2011  . Mammogram  02/25/2012  . Pap Smear  02/24/2013  . Tetanus/tdap  07/26/2014  . Colonoscopy  03/21/2017    The following portions of the patient's history were reviewed and updated as appropriate: allergies, current medications, past family history, past medical history, past social history, past surgical history and problem list.  Review of Systems Review of Systems  Constitutional: Negative for activity change, appetite change and fatigue.  HENT: Negative for hearing loss, congestion, tinnitus and ear discharge.  dentist q80m Eyes: Negative for visual disturbance (see optho q1y -- vision corrected to 20/20 with glasses).  Respiratory: Negative for cough, chest tightness and shortness of breath.   Cardiovascular: Negative for chest pain, palpitations and leg swelling.  Gastrointestinal: Negative for abdominal pain, diarrhea, constipation and abdominal distention.  Genitourinary: Negative for urgency, frequency, decreased urine volume and difficulty urinating.  Musculoskeletal: Negative for back pain, arthralgias and gait problem.  Skin: Negative for color change, pallor and rash.  Neurological: Negative for dizziness, light-headedness, numbness and headaches.  Hematological: Negative for adenopathy. Does not bruise/bleed  easily.  Psychiatric/Behavioral: Negative for suicidal ideas, confusion, sleep disturbance, self-injury, dysphoric mood, decreased concentration and agitation.       Objective:    BP 110/74  Pulse 79  Temp(Src) 98.8 F (37.1 C) (Oral)  Ht 5' 4.75" (1.645 m)  Wt 166 lb 12.8 oz (75.66 kg)  BMI 27.97 kg/m2  SpO2 96% General appearance: alert, cooperative, appears stated age and no distress Head: Normocephalic, without obvious abnormality, atraumatic Eyes: conjunctivae/corneas clear. PERRL, EOM's intact. Fundi benign. Ears: normal TM's and external ear canals both ears Nose: Nares normal. Septum midline. Mucosa normal. No drainage or sinus tenderness. Throat: lips, mucosa, and tongue normal; teeth and gums normal Neck: no adenopathy, no carotid bruit, no JVD, supple, symmetrical, trachea midline and thyroid not enlarged, symmetric, no tenderness/mass/nodules Back: symmetric, no curvature. ROM normal. No CVA tenderness. Lungs: clear to auscultation bilaterally Breasts: gyn Heart: regular rate and rhythm, S1, S2 normal, no murmur, click, rub or gallop Abdomen: soft, non-tender; bowel sounds normal; no masses,  no organomegaly Pelvic: gyn Extremities: extremities normal, atraumatic, no cyanosis or edema Pulses: 2+ and symmetric Skin: Skin color, texture, turgor normal. No rashes or lesions Lymph nodes: Cervical, supraclavicular, and axillary nodes normal. Neurologic: Grossly normal psych-- no depression,  no anxiety    Assessment:    Healthy female exam.   Plan:    ghm utd Check fasting labs See After Visit Summary for Counseling Recommendations

## 2011-07-08 ENCOUNTER — Other Ambulatory Visit: Payer: Self-pay | Admitting: Family Medicine

## 2011-07-22 ENCOUNTER — Encounter: Payer: Self-pay | Admitting: Family Medicine

## 2011-07-22 ENCOUNTER — Ambulatory Visit (INDEPENDENT_AMBULATORY_CARE_PROVIDER_SITE_OTHER): Payer: BC Managed Care – PPO | Admitting: Family Medicine

## 2011-07-22 VITALS — BP 120/74 | HR 83 | Temp 98.8°F | Wt 166.8 lb

## 2011-07-22 DIAGNOSIS — J329 Chronic sinusitis, unspecified: Secondary | ICD-10-CM

## 2011-07-22 MED ORDER — CLARITHROMYCIN ER 500 MG PO TB24: 1000.0000 mg | ORAL_TABLET | Freq: Every day | ORAL | Status: AC

## 2011-07-22 MED ORDER — AZELASTINE-FLUTICASONE 137-50 MCG/ACT NA SUSP: 1.0000 | Freq: Two times a day (BID) | NASAL | Status: DC

## 2011-07-22 NOTE — Progress Notes (Signed)
  Subjective:     [name] is a 55 y.o. female who presents for evaluation of sinus pain. Symptoms include: congestion, cough, facial pain, headaches, nasal congestion, sinus pressure and sore throat. Onset of symptoms was 6 days ago. Symptoms have been gradually worsening since that time. Past history is significant for no history of pneumonia or bronchitis. Patient is a non-smoker.  The following portions of the patient's history were reviewed and updated as appropriate: allergies, current medications, past family history, past medical history, past social history, past surgical history and problem list.  Review of Systems Pertinent items are noted in HPI.   Objective:    BP 120/74  Pulse 83  Temp(Src) 98.8 F (37.1 C) (Oral)  Wt 166 lb 12.8 oz (75.66 kg)  SpO2 98% General appearance: alert, cooperative, appears stated age and no distress Ears: normal TM's and external ear canals both ears Nose: green discharge, moderate congestion, sinus tenderness bilateral Throat: lips, mucosa, and tongue normal; teeth and gums normal Neck: moderate anterior cervical adenopathy, supple, symmetrical, trachea midline and thyroid not enlarged, symmetric, no tenderness/mass/nodules Lungs: clear to auscultation bilaterally Heart: regular rate and rhythm, S1, S2 normal, no murmur, click, rub or gallop Extremities: extremities normal, atraumatic, no cyanosis or edema    Assessment:    Acute bacterial sinusitis.    Plan:    Nasal steroids per medication orders. Antihistamines per medication orders. Biaxin per medication orders. f/u prn

## 2011-07-22 NOTE — Patient Instructions (Signed)

## 2011-10-21 ENCOUNTER — Telehealth: Payer: Self-pay | Admitting: Family Medicine

## 2011-10-21 MED ORDER — FLUTICASONE PROPIONATE 50 MCG/ACT NA SUSP: 2.0000 | Freq: Two times a day (BID) | NASAL | Status: DC | PRN

## 2011-10-21 NOTE — Telephone Encounter (Signed)
Refill: Fluticasone prop 50 mcg spr. Use 1 spray into each nostril twice a day as needed. Qty 16. Last fill 09-13-10 @ [facility].  Note: New pharmacy for this patient

## 2011-10-21 NOTE — Telephone Encounter (Signed)
Prescription sent to pharmacy.

## 2011-10-27 ENCOUNTER — Telehealth: Payer: Self-pay | Admitting: Family Medicine

## 2011-10-27 MED ORDER — FLUTICASONE PROPIONATE 50 MCG/ACT NA SUSP: 2.0000 | Freq: Two times a day (BID) | NASAL | Status: DC | PRN

## 2011-10-27 NOTE — Telephone Encounter (Signed)
2nd refill request:  Fluticasone prop 50 mcg spr. Use 1 spray into each nostril twice a day as needed. Last fill 09-13-10 @ [facility].  See previous phone note.

## 2011-10-29 ENCOUNTER — Other Ambulatory Visit: Payer: Self-pay

## 2011-10-29 ENCOUNTER — Telehealth: Payer: Self-pay | Admitting: Family Medicine

## 2011-10-29 MED ORDER — FLUTICASONE PROPIONATE 50 MCG/ACT NA SUSP: 2.0000 | Freq: Two times a day (BID) | NASAL | Status: DC | PRN

## 2011-10-29 NOTE — Telephone Encounter (Signed)
Training purposes.

## 2012-01-12 ENCOUNTER — Encounter: Payer: Self-pay | Admitting: Family Medicine

## 2012-01-12 ENCOUNTER — Ambulatory Visit (INDEPENDENT_AMBULATORY_CARE_PROVIDER_SITE_OTHER): Payer: BC Managed Care – PPO | Admitting: Family Medicine

## 2012-01-12 VITALS — BP 110/74 | HR 92 | Temp 98.2°F | Wt 162.6 lb

## 2012-01-12 DIAGNOSIS — J329 Chronic sinusitis, unspecified: Secondary | ICD-10-CM

## 2012-01-12 MED ORDER — ERYTHROMYCIN BASE 500 MG PO TABS: ORAL_TABLET | ORAL | Status: DC

## 2012-01-12 NOTE — Progress Notes (Signed)
  Subjective:     [name] is a 56 y.o. female who presents for evaluation of sinus pain. Symptoms include: congestion, facial pain, headaches, nasal congestion and sinus pressure. Onset of symptoms was 1 month ago. Symptoms have been gradually worsening since that time. Past history is significant for no history of pneumonia or bronchitis. Patient is a non-smoker.  The following portions of the patient's history were reviewed and updated as appropriate: allergies, current medications, past family history, past medical history, past social history, past surgical history and problem list.  Review of Systems Pertinent items are noted in HPI.   Objective:    BP 110/74  Pulse 92  Temp(Src) 98.2 F (36.8 C) (Oral)  Wt 162 lb 9.6 oz (73.755 kg)  SpO2 98% General appearance: alert, cooperative, appears stated age and no distress Ears: normal TM's and external ear canals both ears Nose: green discharge, moderate congestion, turbinates red, swollen, sinus tenderness bilateral Throat: lips, mucosa, and tongue normal; teeth and gums normal Neck: mild anterior cervical adenopathy, supple, symmetrical, trachea midline and thyroid not enlarged, symmetric, no tenderness/mass/nodules Lungs: clear to auscultation bilaterally Heart: S1, S2 normal    Assessment:    Acute bacterial sinusitis.    Plan:    Nasal steroids per medication orders. erythromycin per medication orders. f/u prn

## 2012-01-12 NOTE — Patient Instructions (Signed)

## 2012-03-22 ENCOUNTER — Encounter: Payer: BC Managed Care – PPO | Admitting: Family Medicine

## 2012-04-15 ENCOUNTER — Encounter: Payer: Self-pay | Admitting: Family Medicine

## 2012-04-15 ENCOUNTER — Ambulatory Visit (INDEPENDENT_AMBULATORY_CARE_PROVIDER_SITE_OTHER): Payer: BC Managed Care – PPO | Admitting: Family Medicine

## 2012-04-15 VITALS — BP 108/72 | HR 64 | Temp 98.5°F | Ht 65.0 in | Wt 163.8 lb

## 2012-04-15 DIAGNOSIS — J302 Other seasonal allergic rhinitis: Secondary | ICD-10-CM

## 2012-04-15 DIAGNOSIS — Z0000 Encounter for general adult medical examination without abnormal findings: Secondary | ICD-10-CM

## 2012-04-15 DIAGNOSIS — J309 Allergic rhinitis, unspecified: Secondary | ICD-10-CM

## 2012-04-15 LAB — POCT URINALYSIS DIPSTICK
Blood, UA: NEGATIVE
Ketones, UA: NEGATIVE
Protein, UA: NEGATIVE
Spec Grav, UA: <=1.005
Urobilinogen, UA: 0.2
pH, UA: 8

## 2012-04-15 LAB — LIPID PANEL
HDL: 77.2 mg/dL (ref >39.00)
Total CHOL/HDL Ratio: 2
Triglycerides: 39 mg/dL (ref 0.0–149.0)
VLDL: 7.8 mg/dL (ref 0.0–40.0)

## 2012-04-15 LAB — CBC WITH DIFFERENTIAL/PLATELET
Basophils Absolute: 0 10*3/uL (ref 0.0–0.1)
Eosinophils Absolute: 0.1 10*3/uL (ref 0.0–0.7)
Lymphocytes Relative: 47.4 % — ABNORMAL HIGH (ref 12.0–46.0)
MCHC: 33.1 g/dL (ref 30.0–36.0)
Neutrophils Relative %: 39 % — ABNORMAL LOW (ref 43.0–77.0)
RDW: 13.8 % (ref 11.5–14.6)

## 2012-04-15 LAB — TSH: TSH: 1 u[IU]/mL (ref 0.35–5.50)

## 2012-04-15 LAB — BASIC METABOLIC PANEL
BUN: 19 mg/dL (ref 6–23)
CO2: 27 mEq/L (ref 19–32)
Calcium: 8.7 mg/dL (ref 8.4–10.5)
Creatinine, Ser: 0.9 mg/dL (ref 0.4–1.2)
Glucose, Bld: 81 mg/dL (ref 70–99)

## 2012-04-15 LAB — HEPATIC FUNCTION PANEL
Albumin: 3.7 g/dL (ref 3.5–5.2)
Alkaline Phosphatase: 55 U/L (ref 39–117)
Bilirubin, Direct: 0 mg/dL (ref 0.0–0.3)

## 2012-04-15 MED ORDER — FLUTICASONE PROPIONATE 50 MCG/ACT NA SUSP: 2.0000 | Freq: Every day | NASAL | Status: DC

## 2012-04-15 MED ORDER — AZELASTINE HCL 0.15 % NA SOLN: NASAL | Status: DC

## 2012-04-15 NOTE — Progress Notes (Signed)
  Subjective:     [name] is a 56 y.o. female and is here for a comprehensive physical exam. The patient reports no problems.  History   Social History  . Marital Status: Married    Spouse Name: N/A    Number of Children: N/A  . Years of Education: N/A   Occupational History  . investment banking    Social History Main Topics  . Smoking status: Never Smoker   . Smokeless tobacco: Not on file  . Alcohol Use: 0.0 oz/week     rare  . Drug Use: No  . Sexually Active: Yes -- Female partner(s)   Other Topics Concern  . Not on file   Social History Narrative   Exercise--no   Health Maintenance  Topic Date Due  . Colonoscopy  03/21/2012  . Influenza Vaccine  05/11/2012  . Mammogram  04/15/2013  . Pap Smear  04/15/2014  . Tetanus/tdap  07/26/2014    The following portions of the patient's history were reviewed and updated as appropriate: allergies, current medications, past family history, past medical history, past social history, past surgical history and problem list.  Review of Systems Review of Systems  Constitutional: Negative for activity change, appetite change and fatigue.  HENT: Negative for hearing loss, congestion, tinnitus and ear discharge.  dentist q26m Eyes: Negative for visual disturbance (see optho q1y -- vision corrected to 20/20 with glasses).  Respiratory: Negative for cough, chest tightness and shortness of breath.   Cardiovascular: Negative for chest pain, palpitations and leg swelling.  Gastrointestinal: Negative for abdominal pain, diarrhea, constipation and abdominal distention.  Genitourinary: Negative for urgency, frequency, decreased urine volume and difficulty urinating.  Musculoskeletal: Negative for back pain, arthralgias and gait problem.  Skin: Negative for color change, pallor and rash.  Neurological: Negative for dizziness, light-headedness, numbness and headaches.  Hematological: Negative for adenopathy. Does not bruise/bleed easily.    Psychiatric/Behavioral: Negative for suicidal ideas, confusion, sleep disturbance, self-injury, dysphoric mood, decreased concentration and agitation.       Objective:    BP 108/72  Pulse 64  Temp 98.5 F (36.9 C) (Oral)  Ht 5\' 5"  (1.651 m)  Wt 163 lb 12.8 oz (74.299 kg)  BMI 27.26 kg/m2  SpO2 98% General appearance: alert, cooperative, appears stated age and no distress Head: Normocephalic, without obvious abnormality, atraumatic Eyes: conjunctivae/corneas clear. PERRL, EOM's intact. Fundi benign. Ears: normal TM's and external ear canals both ears Nose: Nares normal. Septum midline. Mucosa normal. No drainage or sinus tenderness. Throat: lips, mucosa, and tongue normal; teeth and gums normal Neck: no adenopathy, no carotid bruit, no JVD, supple, symmetrical, trachea midline and thyroid not enlarged, symmetric, no tenderness/mass/nodules Back: symmetric, no curvature. ROM normal. No CVA tenderness. Lungs: clear to auscultation bilaterally Breasts: gyn Heart: regular rate and rhythm, S1, S2 normal, no murmur, click, rub or gallop Abdomen: soft, non-tender; bowel sounds normal; no masses,  no organomegaly Pelvic: deferred---gyn Extremities: extremities normal, atraumatic, no cyanosis or edema Pulses: 2+ and symmetric Skin: Skin color, texture, turgor normal. No rashes or lesions Lymph nodes: Cervical, supraclavicular, and axillary nodes normal. Neurologic: Alert and oriented X 3, normal strength and tone. Normal symmetric reflexes. Normal coordination and gait psych--no depression, no anxiety    Assessment:    Healthy female exam.     allergic rhinitis-- refill meds Plan:    ghm utd Pt will call about Colonoscopy Check labs See After Visit Summary for Counseling Recommendations

## 2012-04-15 NOTE — Patient Instructions (Addendum)
Preventive Care for Adults, Female A healthy lifestyle and preventive care can promote health and wellness. Preventive health guidelines for women include the following key practices.  A routine yearly physical is a good way to check with your caregiver about your health and preventive screening. It is a chance to share any concerns and updates on your health, and to receive a thorough exam.   Visit your dentist for a routine exam and preventive care every 6 months. Brush your teeth twice a day and floss once a day. Good oral hygiene prevents tooth decay and gum disease.   The frequency of eye exams is based on your age, health, family medical history, use of contact lenses, and other factors. Follow your caregiver's recommendations for frequency of eye exams.   Eat a healthy diet. Foods like vegetables, fruits, whole grains, low-fat dairy products, and lean protein foods contain the nutrients you need without too many calories. Decrease your intake of foods high in solid fats, added sugars, and salt. Eat the right amount of calories for you.Get information about a proper diet from your caregiver, if necessary.   Regular physical exercise is one of the most important things you can do for your health. Most adults should get at least 150 minutes of moderate-intensity exercise (any activity that increases your heart rate and causes you to sweat) each week. In addition, most adults need muscle-strengthening exercises on 2 or more days a week.   Maintain a healthy weight. The body mass index (BMI) is a screening tool to identify possible weight problems. It provides an estimate of body fat based on height and weight. Your caregiver can help determine your BMI, and can help you achieve or maintain a healthy weight.For adults 20 years and older:   A BMI below 18.5 is considered underweight.   A BMI of 18.5 to 24.9 is normal.   A BMI of 25 to 29.9 is considered overweight.   A BMI of 30 and above is  considered obese.   Maintain normal blood lipids and cholesterol levels by exercising and minimizing your intake of saturated fat. Eat a balanced diet with plenty of fruit and vegetables. Blood tests for lipids and cholesterol should begin at age 20 and be repeated every 5 years. If your lipid or cholesterol levels are high, you are over 50, or you are at high risk for heart disease, you may need your cholesterol levels checked more frequently.Ongoing high lipid and cholesterol levels should be treated with medicines if diet and exercise are not effective.   If you smoke, find out from your caregiver how to quit. If you do not use tobacco, do not start.   If you are pregnant, do not drink alcohol. If you are breastfeeding, be very cautious about drinking alcohol. If you are not pregnant and choose to drink alcohol, do not exceed 1 drink per day. One drink is considered to be 12 ounces (355 mL) of beer, 5 ounces (148 mL) of wine, or 1.5 ounces (44 mL) of liquor.   Avoid use of street drugs. Do not share needles with anyone. Ask for help if you need support or instructions about stopping the use of drugs.   High blood pressure causes heart disease and increases the risk of stroke. Your blood pressure should be checked at least every 1 to 2 years. Ongoing high blood pressure should be treated with medicines if weight loss and exercise are not effective.   If you are 55 to 55   years old, ask your caregiver if you should take aspirin to prevent strokes.   Diabetes screening involves taking a blood sample to check your fasting blood sugar level. This should be done once every 3 years, after age 45, if you are within normal weight and without risk factors for diabetes. Testing should be considered at a younger age or be carried out more frequently if you are overweight and have at least 1 risk factor for diabetes.   Breast cancer screening is essential preventive care for women. You should practice "breast  self-awareness." This means understanding the normal appearance and feel of your breasts and may include breast self-examination. Any changes detected, no matter how small, should be reported to a caregiver. Women in their 20s and 30s should have a clinical breast exam (CBE) by a caregiver as part of a regular health exam every 1 to 3 years. After age 40, women should have a CBE every year. Starting at age 40, women should consider having a mammography (breast X-ray test) every year. Women who have a family history of breast cancer should talk to their caregiver about genetic screening. Women at a high risk of breast cancer should talk to their caregivers about having magnetic resonance imaging (MRI) and a mammography every year.   The Pap test is a screening test for cervical cancer. A Pap test can show cell changes on the cervix that might become cervical cancer if left untreated. A Pap test is a procedure in which cells are obtained and examined from the lower end of the uterus (cervix).   Women should have a Pap test starting at age 21.   Between ages 21 and 29, Pap tests should be repeated every 2 years.   Beginning at age 30, you should have a Pap test every 3 years as long as the past 3 Pap tests have been normal.   Some women have medical problems that increase the chance of getting cervical cancer. Talk to your caregiver about these problems. It is especially important to talk to your caregiver if a new problem develops soon after your last Pap test. In these cases, your caregiver may recommend more frequent screening and Pap tests.   The above recommendations are the same for women who have or have not gotten the vaccine for human papillomavirus (HPV).   If you had a hysterectomy for a problem that was not cancer or a condition that could lead to cancer, then you no longer need Pap tests. Even if you no longer need a Pap test, a regular exam is a good idea to make sure no other problems are  starting.   If you are between ages 65 and 70, and you have had normal Pap tests going back 10 years, you no longer need Pap tests. Even if you no longer need a Pap test, a regular exam is a good idea to make sure no other problems are starting.   If you have had past treatment for cervical cancer or a condition that could lead to cancer, you need Pap tests and screening for cancer for at least 20 years after your treatment.   If Pap tests have been discontinued, risk factors (such as a new sexual partner) need to be reassessed to determine if screening should be resumed.   The HPV test is an additional test that may be used for cervical cancer screening. The HPV test looks for the virus that can cause the cell changes on the cervix.   The cells collected during the Pap test can be tested for HPV. The HPV test could be used to screen women aged 30 years and older, and should be used in women of any age who have unclear Pap test results. After the age of 30, women should have HPV testing at the same frequency as a Pap test.   Colorectal cancer can be detected and often prevented. Most routine colorectal cancer screening begins at the age of 50 and continues through age 75. However, your caregiver may recommend screening at an earlier age if you have risk factors for colon cancer. On a yearly basis, your caregiver may provide home test kits to check for hidden blood in the stool. Use of a small camera at the end of a tube, to directly examine the colon (sigmoidoscopy or colonoscopy), can detect the earliest forms of colorectal cancer. Talk to your caregiver about this at age 50, when routine screening begins. Direct examination of the colon should be repeated every 5 to 10 years through age 75, unless early forms of pre-cancerous polyps or small growths are found.   Hepatitis C blood testing is recommended for all people born from 1945 through 1965 and any individual with known risks for hepatitis C.    Practice safe sex. Use condoms and avoid high-risk sexual practices to reduce the spread of sexually transmitted infections (STIs). STIs include gonorrhea, chlamydia, syphilis, trichomonas, herpes, HPV, and human immunodeficiency virus (HIV). Herpes, HIV, and HPV are viral illnesses that have no cure. They can result in disability, cancer, and death. Sexually active women aged 25 and younger should be checked for chlamydia. Older women with new or multiple partners should also be tested for chlamydia. Testing for other STIs is recommended if you are sexually active and at increased risk.   Osteoporosis is a disease in which the bones lose minerals and strength with aging. This can result in serious bone fractures. The risk of osteoporosis can be identified using a bone density scan. Women ages 65 and over and women at risk for fractures or osteoporosis should discuss screening with their caregivers. Ask your caregiver whether you should take a calcium supplement or vitamin D to reduce the rate of osteoporosis.   Menopause can be associated with physical symptoms and risks. Hormone replacement therapy is available to decrease symptoms and risks. You should talk to your caregiver about whether hormone replacement therapy is right for you.   Use sunscreen with sun protection factor (SPF) of 30 or more. Apply sunscreen liberally and repeatedly throughout the day. You should seek shade when your shadow is shorter than you. Protect yourself by wearing long sleeves, pants, a wide-brimmed hat, and sunglasses year round, whenever you are outdoors.   Once a month, do a whole body skin exam, using a mirror to look at the skin on your back. Notify your caregiver of new moles, moles that have irregular borders, moles that are larger than a pencil eraser, or moles that have changed in shape or color.   Stay current with required immunizations.   Influenza. You need a dose every fall (or winter). The composition of  the flu vaccine changes each year, so being vaccinated once is not enough.   Pneumococcal polysaccharide. You need 1 to 2 doses if you smoke cigarettes or if you have certain chronic medical conditions. You need 1 dose at age 65 (or older) if you have never been vaccinated.   Tetanus, diphtheria, pertussis (Tdap, Td). Get 1 dose of   Tdap vaccine if you are younger than age 65, are over 65 and have contact with an infant, are a healthcare worker, are pregnant, or simply want to be protected from whooping cough. After that, you need a Td booster dose every 10 years. Consult your caregiver if you have not had at least 3 tetanus and diphtheria-containing shots sometime in your life or have a deep or dirty wound.   HPV. You need this vaccine if you are a woman age 26 or younger. The vaccine is given in 3 doses over 6 months.   Measles, mumps, rubella (MMR). You need at least 1 dose of MMR if you were born in 1957 or later. You may also need a second dose.   Meningococcal. If you are age 19 to 21 and a first-year college student living in a residence hall, or have one of several medical conditions, you need to get vaccinated against meningococcal disease. You may also need additional booster doses.   Zoster (shingles). If you are age 60 or older, you should get this vaccine.   Varicella (chickenpox). If you have never had chickenpox or you were vaccinated but received only 1 dose, talk to your caregiver to find out if you need this vaccine.   Hepatitis A. You need this vaccine if you have a specific risk factor for hepatitis A virus infection or you simply wish to be protected from this disease. The vaccine is usually given as 2 doses, 6 to 18 months apart.   Hepatitis B. You need this vaccine if you have a specific risk factor for hepatitis B virus infection or you simply wish to be protected from this disease. The vaccine is given in 3 doses, usually over 6 months.  Preventive Services /  Frequency Ages 19 to 39  Blood pressure check.** / Every 1 to 2 years.   Lipid and cholesterol check.** / Every 5 years beginning at age 20.   Clinical breast exam.** / Every 3 years for women in their 20s and 30s.   Pap test.** / Every 2 years from ages 21 through 29. Every 3 years starting at age 30 through age 65 or 70 with a history of 3 consecutive normal Pap tests.   HPV screening.** / Every 3 years from ages 30 through ages 65 to 70 with a history of 3 consecutive normal Pap tests.   Hepatitis C blood test.** / For any individual with known risks for hepatitis C.   Skin self-exam. / Monthly.   Influenza immunization.** / Every year.   Pneumococcal polysaccharide immunization.** / 1 to 2 doses if you smoke cigarettes or if you have certain chronic medical conditions.   Tetanus, diphtheria, pertussis (Tdap, Td) immunization. / A one-time dose of Tdap vaccine. After that, you need a Td booster dose every 10 years.   HPV immunization. / 3 doses over 6 months, if you are 26 and younger.   Measles, mumps, rubella (MMR) immunization. / You need at least 1 dose of MMR if you were born in 1957 or later. You may also need a second dose.   Meningococcal immunization. / 1 dose if you are age 19 to 21 and a first-year college student living in a residence hall, or have one of several medical conditions, you need to get vaccinated against meningococcal disease. You may also need additional booster doses.   Varicella immunization.** / Consult your caregiver.   Hepatitis A immunization.** / Consult your caregiver. 2 doses, 6 to 18 months   apart.   Hepatitis B immunization.** / Consult your caregiver. 3 doses usually over 6 months.  Ages 40 to 64  Blood pressure check.** / Every 1 to 2 years.   Lipid and cholesterol check.** / Every 5 years beginning at age 20.   Clinical breast exam.** / Every year after age 40.   Mammogram.** / Every year beginning at age 40 and continuing for as  long as you are in good health. Consult with your caregiver.   Pap test.** / Every 3 years starting at age 30 through age 65 or 70 with a history of 3 consecutive normal Pap tests.   HPV screening.** / Every 3 years from ages 30 through ages 65 to 70 with a history of 3 consecutive normal Pap tests.   Fecal occult blood test (FOBT) of stool. / Every year beginning at age 50 and continuing until age 75. You may not need to do this test if you get a colonoscopy every 10 years.   Flexible sigmoidoscopy or colonoscopy.** / Every 5 years for a flexible sigmoidoscopy or every 10 years for a colonoscopy beginning at age 50 and continuing until age 75.   Hepatitis C blood test.** / For all people born from 1945 through 1965 and any individual with known risks for hepatitis C.   Skin self-exam. / Monthly.   Influenza immunization.** / Every year.   Pneumococcal polysaccharide immunization.** / 1 to 2 doses if you smoke cigarettes or if you have certain chronic medical conditions.   Tetanus, diphtheria, pertussis (Tdap, Td) immunization.** / A one-time dose of Tdap vaccine. After that, you need a Td booster dose every 10 years.   Measles, mumps, rubella (MMR) immunization. / You need at least 1 dose of MMR if you were born in 1957 or later. You may also need a second dose.   Varicella immunization.** / Consult your caregiver.   Meningococcal immunization.** / Consult your caregiver.   Hepatitis A immunization.** / Consult your caregiver. 2 doses, 6 to 18 months apart.   Hepatitis B immunization.** / Consult your caregiver. 3 doses, usually over 6 months.  Ages 65 and over  Blood pressure check.** / Every 1 to 2 years.   Lipid and cholesterol check.** / Every 5 years beginning at age 20.   Clinical breast exam.** / Every year after age 40.   Mammogram.** / Every year beginning at age 40 and continuing for as long as you are in good health. Consult with your caregiver.   Pap test.** /  Every 3 years starting at age 30 through age 65 or 70 with a 3 consecutive normal Pap tests. Testing can be stopped between 65 and 70 with 3 consecutive normal Pap tests and no abnormal Pap or HPV tests in the past 10 years.   HPV screening.** / Every 3 years from ages 30 through ages 65 or 70 with a history of 3 consecutive normal Pap tests. Testing can be stopped between 65 and 70 with 3 consecutive normal Pap tests and no abnormal Pap or HPV tests in the past 10 years.   Fecal occult blood test (FOBT) of stool. / Every year beginning at age 50 and continuing until age 75. You may not need to do this test if you get a colonoscopy every 10 years.   Flexible sigmoidoscopy or colonoscopy.** / Every 5 years for a flexible sigmoidoscopy or every 10 years for a colonoscopy beginning at age 50 and continuing until age 75.   Hepatitis   C blood test.** / For all people born from [birth year] through 1965 and any individual with known risks for hepatitis C.   Osteoporosis screening.** / A one-time screening for women ages 73 and over and women at risk for fractures or osteoporosis.   Skin self-exam. / Monthly.   Influenza immunization.** / Every year.   Pneumococcal polysaccharide immunization.** / 1 dose at age 103 (or older) if you have never been vaccinated.   Tetanus, diphtheria, pertussis (Tdap, Td) immunization. / A one-time dose of Tdap vaccine if you are over 65 and have contact with an infant, are a Research scientist (physical sciences), or simply want to be protected from whooping cough. After that, you need a Td booster dose every 10 years.   Varicella immunization.** / Consult your caregiver.   Meningococcal immunization.** / Consult your caregiver.   Hepatitis A immunization.** / Consult your caregiver. 2 doses, 6 to 18 months apart.   Hepatitis B immunization.** / Check with your caregiver. 3 doses, usually over 6 months.  ** Family history and personal history of risk and conditions may change your caregiver's  recommendations. Document Released: 09/23/2001 Document Revised: 07/17/2011 Document Reviewed: 12/23/2010 [facility] Patient Information 2012 [place], [place].   Call your GI Doctor about your colonoscopy

## 2012-05-11 ENCOUNTER — Other Ambulatory Visit: Payer: Self-pay | Admitting: Obstetrics and Gynecology

## 2012-06-03 ENCOUNTER — Other Ambulatory Visit: Payer: BC Managed Care – PPO

## 2012-06-07 ENCOUNTER — Encounter: Payer: Self-pay | Admitting: Family Medicine

## 2012-06-07 ENCOUNTER — Ambulatory Visit (INDEPENDENT_AMBULATORY_CARE_PROVIDER_SITE_OTHER): Payer: BC Managed Care – PPO | Admitting: Family Medicine

## 2012-06-07 VITALS — BP 120/70 | HR 85 | Temp 99.1°F | Wt 164.6 lb

## 2012-06-07 DIAGNOSIS — J309 Allergic rhinitis, unspecified: Secondary | ICD-10-CM

## 2012-06-07 DIAGNOSIS — J302 Other seasonal allergic rhinitis: Secondary | ICD-10-CM

## 2012-06-07 MED ORDER — LEVOCETIRIZINE DIHYDROCHLORIDE 5 MG PO TABS: 5.0000 mg | ORAL_TABLET | Freq: Every evening | ORAL | Status: DC

## 2012-06-07 NOTE — Patient Instructions (Addendum)

## 2012-06-08 ENCOUNTER — Telehealth: Payer: Self-pay | Admitting: Family Medicine

## 2012-06-08 NOTE — Progress Notes (Signed)
  Subjective:     [name] is a 56 y.o. female who presents for evaluation of sinus pain. Symptoms include: congestion, facial pain, itchy eyes, itchy nose, nasal congestion, post nasal drip, sinus pressure and sneezing. Onset of symptoms was 1 week ago. Symptoms have been unchanged since that time. Past history is significant for no history of pneumonia or bronchitis. Patient is a non-smoker.  The following portions of the patient's history were reviewed and updated as appropriate: allergies, current medications, past family history, past medical history, past social history, past surgical history and problem list.  Review of Systems Pertinent items are noted in HPI.   Objective:    BP 120/70  Pulse 85  Temp 99.1 F (37.3 C) (Oral)  Wt 164 lb 9.6 oz (74.662 kg)  SpO2 98% General appearance: alert, cooperative, appears stated age and no distress Ears: normal TM's and external ear canals both ears Nose: clear discharge, mild congestion, no sinus tenderness Throat: lips, mucosa, and tongue normal; teeth and gums normal Neck: no adenopathy, supple, symmetrical, trachea midline and thyroid not enlarged, symmetric, no tenderness/mass/nodules Lungs: clear to auscultation bilaterally    Assessment:    Acute allergic sinusitis.    Plan:    Nasal steroids per medication orders. Antihistamines per medication orders. f/u prn-- if symptoms worsen she may need abx

## 2012-06-08 NOTE — Telephone Encounter (Signed)
Caller: [name]/Patient; Patient Name: [name]; PCP: Lelon Perla.; Best Callback Phone Number: [phone].  Patient calling about follow up for sinusitis.  States seen in office 06/07/12 and diagnosed with sinusitis; told to call back 06/11/12 if not improved.  States she is concerned Dr. Laury Axon may have thought her symptoms started 06/06/12 but really they started a week ago.  Per Epic, Dr. Ernst Spell note states symptoms started a week ago; patient advised of this.  Denies worsening symtpoms; will continue current care measures.  Callback parameters given.

## 2012-06-11 ENCOUNTER — Telehealth: Payer: Self-pay | Admitting: Family Medicine

## 2012-06-11 MED ORDER — ERYTHROMYCIN BASE 500 MG PO TABS: 500.0000 mg | ORAL_TABLET | Freq: Two times a day (BID) | ORAL | Status: DC

## 2012-06-11 NOTE — Telephone Encounter (Signed)
Patient calling to leave a message for Dr. Laury Axon.  She was seen earlier in the week and told to call if her sx worsened or didn't improve.  Has continued to cough and has post nasal drainage.  Taking Motrin for the sinus headache.  Doesn't think she has a fever.  The sputum is yellow.  LMP hysterectomy.  Has some facial swelling and tenderness.   Uses [facility] at [phone].

## 2012-06-11 NOTE — Telephone Encounter (Signed)
Erythromycin 500 mg 1 po bid for 10 days  

## 2012-06-11 NOTE — Telephone Encounter (Signed)
Patient aware Rx has been faxed.      KP 

## 2012-06-16 ENCOUNTER — Other Ambulatory Visit: Payer: BC Managed Care – PPO

## 2012-06-28 ENCOUNTER — Encounter: Payer: Self-pay | Admitting: Family Medicine

## 2012-06-28 ENCOUNTER — Telehealth: Payer: Self-pay | Admitting: Family Medicine

## 2012-06-28 ENCOUNTER — Ambulatory Visit (INDEPENDENT_AMBULATORY_CARE_PROVIDER_SITE_OTHER): Payer: BC Managed Care – PPO | Admitting: Family Medicine

## 2012-06-28 VITALS — BP 116/76 | HR 72 | Temp 98.3°F | Wt 164.0 lb

## 2012-06-28 DIAGNOSIS — J329 Chronic sinusitis, unspecified: Secondary | ICD-10-CM

## 2012-06-28 MED ORDER — METHYLPREDNISOLONE ACETATE 80 MG/ML IJ SUSP
80.0000 mg | Freq: Once | INTRAMUSCULAR | Status: AC
  Administered 2012-06-28: 80 mg via INTRAMUSCULAR

## 2012-06-28 MED ORDER — PREDNISONE 10 MG PO TABS: ORAL_TABLET | ORAL | Status: DC

## 2012-06-28 MED ORDER — LEVOFLOXACIN 500 MG PO TABS: 500.0000 mg | ORAL_TABLET | Freq: Every day | ORAL | Status: DC

## 2012-06-28 NOTE — Telephone Encounter (Signed)
Scheduled today at 11:15.    KP

## 2012-06-28 NOTE — Progress Notes (Signed)
  Subjective:     [name] is a 56 y.o. female who presents for evaluation of sinus pain. Symptoms include: cough, headaches, nasal congestion and sinus pressure. Onset of symptoms was 1 month ago. Symptoms have been gradually worsening since that time. Past history is significant for sinusitis. Patient is a non-smoker.  The following portions of the patient's history were reviewed and updated as appropriate: allergies, current medications, past family history, past medical history, past social history, past surgical history and problem list.  Review of Systems Pertinent items are noted in HPI.   Objective:    BP 116/76  Pulse 72  Temp 98.3 F (36.8 C) (Oral)  Wt 164 lb (74.39 kg)  SpO2 97% General appearance: alert, cooperative, appears stated age and no distress Ears: normal TM's and external ear canals both ears Nose: green discharge, moderate congestion, turbinates red, swollen, sinus tenderness bilateral Throat: abnormal findings: pnd Neck: mild anterior cervical adenopathy, supple, symmetrical, trachea midline and thyroid not enlarged, symmetric, no tenderness/mass/nodules Lungs: clear to auscultation bilaterally Heart: S1, S2 normal    Assessment:    Acute bacterial sinusitis.    Plan:    Nasal steroids per medication orders. Antihistamines per medication orders. levaquin per medication orders.

## 2012-06-28 NOTE — Telephone Encounter (Signed)
Patient has had on-going sinus infection since 05/11/12.  Initially started as coughing with sinus congestion.  Was given Biaxin for seven days with some improvement but returned with head congestion, yellow nasal drainage and sinus pressure/pain patient rates a 10/10 currently.  Denies fever or emergent symptoms.  Triaged using upper respiratory infection with a disposition to see provider today or tomorrow due to sinus congestion with pressure > 10 days. Appointment made with Dr. Laury Axon at 11:15 today 06/28/12

## 2012-06-28 NOTE — Patient Instructions (Addendum)

## 2012-06-30 ENCOUNTER — Other Ambulatory Visit: Payer: BC Managed Care – PPO

## 2012-07-27 ENCOUNTER — Other Ambulatory Visit (INDEPENDENT_AMBULATORY_CARE_PROVIDER_SITE_OTHER): Payer: BC Managed Care – PPO

## 2012-07-27 DIAGNOSIS — D7289 Other specified disorders of white blood cells: Secondary | ICD-10-CM

## 2012-07-27 LAB — CBC WITH DIFFERENTIAL/PLATELET
Basophils Absolute: 0 10*3/uL (ref 0.0–0.1)
Basophils Relative: 0.5 % (ref 0.0–3.0)
Eosinophils Absolute: 0.1 10*3/uL (ref 0.0–0.7)
Eosinophils Relative: 3.3 % (ref 0.0–5.0)
HCT: 38.5 % (ref 36.0–46.0)
Hemoglobin: 13.1 g/dL (ref 12.0–15.0)
Lymphocytes Relative: 47.8 % — ABNORMAL HIGH (ref 12.0–46.0)
Lymphs Abs: 1.6 10*3/uL (ref 0.7–4.0)
MCHC: 34 g/dL (ref 30.0–36.0)
MCV: 94.7 fl (ref 78.0–100.0)
Monocytes Absolute: 0.4 10*3/uL (ref 0.1–1.0)
Monocytes Relative: 11.5 % (ref 3.0–12.0)
Neutro Abs: 1.2 10*3/uL — ABNORMAL LOW (ref 1.4–7.7)
Neutrophils Relative %: 36.9 % — ABNORMAL LOW (ref 43.0–77.0)
Platelets: 236 10*3/uL (ref 150.0–400.0)
RBC: 4.07 Mil/uL (ref 3.87–5.11)
RDW: 13.4 % (ref 11.5–14.6)
WBC: 3.3 10*3/uL — ABNORMAL LOW (ref 4.5–10.5)

## 2012-10-13 ENCOUNTER — Encounter: Payer: Self-pay | Admitting: Internal Medicine

## 2012-10-13 ENCOUNTER — Ambulatory Visit (INDEPENDENT_AMBULATORY_CARE_PROVIDER_SITE_OTHER): Payer: BC Managed Care – PPO | Admitting: Internal Medicine

## 2012-10-13 VITALS — BP 112/70 | HR 93 | Wt 168.0 lb

## 2012-10-13 DIAGNOSIS — J302 Other seasonal allergic rhinitis: Secondary | ICD-10-CM

## 2012-10-13 DIAGNOSIS — J019 Acute sinusitis, unspecified: Secondary | ICD-10-CM

## 2012-10-13 MED ORDER — AZITHROMYCIN 250 MG PO TABS: ORAL_TABLET | ORAL | Status: DC

## 2012-10-13 MED ORDER — HYDROCODONE-HOMATROPINE 5-1.5 MG/5ML PO SYRP: 5.0000 mL | ORAL_SOLUTION | Freq: Every evening | ORAL | Status: DC | PRN

## 2012-10-13 MED ORDER — AZELASTINE HCL 0.15 % NA SOLN: NASAL | Status: DC

## 2012-10-13 NOTE — Progress Notes (Signed)
  Subjective:    Patient ID: [name], female    DOB: 10/08/1955, 57 y.o.   MRN: [id number]  HPI Acute visit. Symptoms started a week ago: Headaches, runny nose, abundant postnasal dripping. She is coughing quite frequently, thinks secondary to postnasal dripping. She's taking Advil, Sudafed, Tylenol with minimal relief.  Past Medical History  Diagnosis Date  . Allergy   . Allergic rhinitis    Past Surgical History  Procedure Laterality Date  . Breast biopsy  11-2000    benign  . Vaginal hysterectomy    . Oophorectomy     History   Social History  . Marital Status: Married    Spouse Name: N/A    Number of Children: 1  . Years of Education: N/A   Occupational History  . investment banking    Social History Main Topics  . Smoking status: Never Smoker   . Smokeless tobacco: Never Used  . Alcohol Use: 0.0 oz/week     Comment: rare  . Drug Use: No  . Sexually Active: Yes -- Female partner(s)   Other Topics Concern  . Not on file   Social History Narrative   Exercise--no   Review of Systems Having a hard time sleeping due to cough. No fever, she did have chills and body aches today.     Objective:   Physical Exam General -- alert, well-developed, no apparent distress except for persisting cough .   HEENT -- TMs normal, throat w/o redness, face symmetric and   tender to palpation left maxillary area. Nose quite congested.  Lungs -- normal respiratory effort, no intercostal retractions, no accessory muscle use, and normal breath sounds.   Heart-- normal rate, regular rhythm, no murmur, and no gallop.   Neurologic-- alert & oriented X3 and strength normal in all extremities. Psych-- Cognition and judgment appear intact. Alert and cooperative with normal attention span and concentration.  not anxious appearing and not depressed appearing.       Assessment & Plan:

## 2012-10-13 NOTE — Patient Instructions (Addendum)
Rest, fluids , tylenol For cough, take Mucinex DM twice a day as needed  If the cough is severe, use hydrocodone at night For congestion use sudafed for few days astelin at night , 2 sprays on each side until better  Take the antibiotic as prescribed  (zithromax) Call if no better in few days Call anytime if the symptoms are severe

## 2012-10-13 NOTE — Assessment & Plan Note (Signed)
Symptoms consistent with sinusitis, allergic to penicillin and Bactrim. She has severe cough, unable to sleep. Plan: see instructions

## 2013-06-15 ENCOUNTER — Other Ambulatory Visit: Payer: Self-pay | Admitting: Family Medicine

## 2013-06-15 LAB — HM DEXA SCAN

## 2013-06-15 LAB — HM MAMMOGRAPHY

## 2013-06-20 ENCOUNTER — Other Ambulatory Visit: Payer: Self-pay | Admitting: Obstetrics and Gynecology

## 2013-08-12 ENCOUNTER — Other Ambulatory Visit: Payer: Self-pay | Admitting: Family Medicine

## 2013-08-26 ENCOUNTER — Ambulatory Visit: Payer: BC Managed Care – PPO | Admitting: Family Medicine

## 2013-09-09 ENCOUNTER — Telehealth: Payer: Self-pay

## 2013-09-09 ENCOUNTER — Ambulatory Visit (INDEPENDENT_AMBULATORY_CARE_PROVIDER_SITE_OTHER): Payer: BC Managed Care – PPO | Admitting: Family Medicine

## 2013-09-09 ENCOUNTER — Encounter: Payer: Self-pay | Admitting: Family Medicine

## 2013-09-09 VITALS — BP 110/64 | HR 78 | Temp 97.8°F | Wt 174.0 lb

## 2013-09-09 DIAGNOSIS — J019 Acute sinusitis, unspecified: Secondary | ICD-10-CM

## 2013-09-09 MED ORDER — CLARITHROMYCIN ER 500 MG PO TB24: 1000.0000 mg | ORAL_TABLET | Freq: Every day | ORAL | Status: AC

## 2013-09-09 MED ORDER — LEVOCETIRIZINE DIHYDROCHLORIDE 5 MG PO TABS: ORAL_TABLET | ORAL | Status: DC

## 2013-09-09 NOTE — Progress Notes (Signed)
Pre visit review using our clinic review tool, if applicable. No additional management support is needed unless otherwise documented below in the visit note. 

## 2013-09-09 NOTE — Progress Notes (Signed)
  Subjective:     [name] is a 58 y.o. female who presents for evaluation of sinus pain. Symptoms include: congestion, facial pain, headaches, nasal congestion, purulent rhinorrhea and sinus pressure. Onset of symptoms was 6 months ago. Symptoms have been gradually worsening since that time. Past history is significant for no history of pneumonia or bronchitis. Patient is a non-smoker.  The following portions of the patient's history were reviewed and updated as appropriate: allergies, current medications, past family history, past medical history, past social history, past surgical history and problem list.  Review of Systems Pertinent items are noted in HPI.   Objective:    BP 110/64  Pulse 78  Temp(Src) 97.8 F (36.6 C) (Oral)  Wt 174 lb (78.926 kg)  SpO2 98% General appearance: alert, cooperative, appears stated age and no distress Ears: R TM dull + fluid Nose: green discharge, moderate congestion, turbinates red, swollen, sinus tenderness bilateral Throat: lips, mucosa, and tongue normal; teeth and gums normal Neck: mild anterior cervical adenopathy, supple, symmetrical, trachea midline and thyroid not enlarged, symmetric, no tenderness/mass/nodules Lungs: clear to auscultation bilaterally Heart: S1, S2 normal    Assessment:    Acute bacterial sinusitis.    Plan:    Neti pot recommended. Instructions given. Nasal steroids per medication orders. Antihistamines per medication orders. Biaxin per medication orders.

## 2013-09-09 NOTE — Telephone Encounter (Signed)
Pharmacist states that they do not have the XL Biaxin. Ok's to change to plan Biaxin. Per pharmacy, no content change.

## 2013-09-09 NOTE — Patient Instructions (Signed)

## 2013-10-26 ENCOUNTER — Other Ambulatory Visit: Payer: Self-pay | Admitting: Family Medicine

## 2014-01-17 ENCOUNTER — Other Ambulatory Visit: Payer: Self-pay | Admitting: Internal Medicine

## 2014-03-24 ENCOUNTER — Other Ambulatory Visit: Payer: Self-pay | Admitting: Family Medicine

## 2014-06-15 ENCOUNTER — Encounter: Payer: Self-pay | Admitting: Family Medicine

## 2014-06-15 ENCOUNTER — Ambulatory Visit (INDEPENDENT_AMBULATORY_CARE_PROVIDER_SITE_OTHER): Payer: BC Managed Care – PPO | Admitting: Family Medicine

## 2014-06-15 VITALS — BP 122/73 | HR 70 | Temp 98.2°F | Wt 169.0 lb

## 2014-06-15 DIAGNOSIS — Z0000 Encounter for general adult medical examination without abnormal findings: Secondary | ICD-10-CM

## 2014-06-15 NOTE — Progress Notes (Signed)
Pre visit review using our clinic review tool, if applicable. No additional management support is needed unless otherwise documented below in the visit note. 

## 2014-06-15 NOTE — Progress Notes (Signed)
Subjective:     [name] is a 58 y.o. female and is here for a comprehensive physical exam. The patient reports no problems.  History   Social History  . Marital Status: Married    Spouse Name: N/A    Number of Children: 1  . Years of Education: N/A   Occupational History  . investment banking    Social History Main Topics  . Smoking status: Never Smoker   . Smokeless tobacco: Never Used  . Alcohol Use: 0.0 oz/week     Comment: rare  . Drug Use: No  . Sexual Activity:    Partners: Male   Other Topics Concern  . Not on file   Social History Narrative   Exercise--no   Health Maintenance  Topic Date Due  . INFLUENZA VACCINE  09/09/2014 (Originally 03/11/2014)  . TETANUS/TDAP  07/26/2014  . MAMMOGRAM  06/16/2015  . PAP SMEAR  06/20/2016  . COLONOSCOPY  06/25/2017    The following portions of the patient's history were reviewed and updated as appropriate:  She  has a past medical history of Allergy and Allergic rhinitis. She  does not have any pertinent problems on file. She  has past surgical history that includes Breast biopsy (11-2000); Vaginal hysterectomy; and Oophorectomy. Her family history includes Breast cancer in her mother; Hypertension in her mother; Pancreatitis in her father; Stroke in her mother. She  reports that she has never smoked. She has never used smokeless tobacco. She reports that she drinks alcohol. She reports that she does not use illicit drugs. She has a current medication list which includes the following prescription(s): astepro, estriol, fluticasone, and levocetirizine. Current Outpatient Prescriptions on File Prior to Visit  Medication Sig Dispense Refill  . ASTEPRO 0.15 % SOLN INSTILL 2 SPRAYS IN EACH NOSTRIL DAILY. 30 mL 2  . Estriol POWD     . fluticasone (FLONASE) 50 MCG/ACT nasal spray PLACE 2 SPRAYS INTO THE NOSE DAILY. 16 g 1  . levocetirizine (XYZAL) 5 MG tablet 1 tab by mouth daily- 30 tablet 11   No current  facility-administered medications on file prior to visit.   She is allergic to penicillins and sulfonamide derivatives..  Review of Systems Review of Systems  Constitutional: Negative for activity change, appetite change and fatigue.  HENT: Negative for hearing loss, congestion, tinnitus and ear discharge.  dentist [id number] Eyes: Negative for visual disturbance (see optho q1y -- vision corrected to 20/20 with glasses).  Respiratory: Negative for cough, chest tightness and shortness of breath.   Cardiovascular: Negative for chest pain, palpitations and leg swelling.  Gastrointestinal: Negative for abdominal pain, diarrhea, constipation and abdominal distention.  Genitourinary: Negative for urgency, frequency, decreased urine volume and difficulty urinating.  Musculoskeletal: Negative for back pain, arthralgias and gait problem.  Skin: Negative for color change, pallor and rash.  Neurological: Negative for dizziness, light-headedness, numbness and headaches.  Hematological: Negative for adenopathy. Does not bruise/bleed easily.  Psychiatric/Behavioral: Negative for suicidal ideas, confusion, sleep disturbance, self-injury, dysphoric mood, decreased concentration and agitation.       Objective:    BP 122/73 mmHg  Pulse 70  Temp(Src) 98.2 F (36.8 C)  Wt 169 lb (76.658 kg)  SpO2 99% General appearance: alert, cooperative, appears stated age and no distress Head: Normocephalic, without obvious abnormality, atraumatic Eyes: conjunctivae/corneas clear. PERRL, EOM's intact. Fundi benign. Ears: normal TM's and external ear canals both ears Nose: Nares normal. Septum midline. Mucosa normal. No drainage or sinus tenderness. Throat: lips, mucosa, and  tongue normal; teeth and gums normal Neck: no adenopathy, no carotid bruit, no JVD, supple, symmetrical, trachea midline and thyroid not enlarged, symmetric, no tenderness/mass/nodules Back: symmetric, no curvature. ROM normal. No CVA  tenderness. Lungs: clear to auscultation bilaterally Breasts: gyn Heart: regular rate and rhythm, S1, S2 normal, no murmur, click, rub or gallop Abdomen: soft, non-tender; bowel sounds normal; no masses,  no organomegaly Pelvic: deferred--gyn Extremities: extremities normal, atraumatic, no cyanosis or edema Pulses: 2+ and symmetric Skin: Skin color, texture, turgor normal. No rashes or lesions Lymph nodes: Cervical, supraclavicular, and axillary nodes normal. Neurologic: Alert and oriented X 3, normal strength and tone. Normal symmetric reflexes. Normal coordination and gait Psych-- no depression, no anxiety      Assessment:    Healthy female exam.      Plan:    ghm utd Check labs See After Visit Summary for Counseling Recommendations   1. Preventative health care  - Basic metabolic panel - CBC with Differential - Hepatic function panel - Lipid panel - POCT urinalysis dipstick - TSH

## 2014-06-15 NOTE — Patient Instructions (Signed)
Preventive Care for Adults A healthy lifestyle and preventive care can promote health and wellness. Preventive health guidelines for women include the following key practices.  A routine yearly physical is a good way to check with your health care provider about your health and preventive screening. It is a chance to share any concerns and updates on your health and to receive a thorough exam.  Visit your dentist for a routine exam and preventive care every 6 months. Brush your teeth twice a day and floss once a day. Good oral hygiene prevents tooth decay and gum disease.  The frequency of eye exams is based on your age, health, family medical history, use of contact lenses, and other factors. Follow your health care provider's recommendations for frequency of eye exams.  Eat a healthy diet. Foods like vegetables, fruits, whole grains, low-fat dairy products, and lean protein foods contain the nutrients you need without too many calories. Decrease your intake of foods high in solid fats, added sugars, and salt. Eat the right amount of calories for you.Get information about a proper diet from your health care provider, if necessary.  Regular physical exercise is one of the most important things you can do for your health. Most adults should get at least 150 minutes of moderate-intensity exercise (any activity that increases your heart rate and causes you to sweat) each week. In addition, most adults need muscle-strengthening exercises on 2 or more days a week.  Maintain a healthy weight. The body mass index (BMI) is a screening tool to identify possible weight problems. It provides an estimate of body fat based on height and weight. Your health care provider can find your BMI and can help you achieve or maintain a healthy weight.For adults 20 years and older:  A BMI below 18.5 is considered underweight.  A BMI of 18.5 to 24.9 is normal.  A BMI of 25 to 29.9 is considered overweight.  A BMI of  30 and above is considered obese.  Maintain normal blood lipids and cholesterol levels by exercising and minimizing your intake of saturated fat. Eat a balanced diet with plenty of fruit and vegetables. Blood tests for lipids and cholesterol should begin at age 76 and be repeated every 5 years. If your lipid or cholesterol levels are high, you are over 50, or you are at high risk for heart disease, you may need your cholesterol levels checked more frequently.Ongoing high lipid and cholesterol levels should be treated with medicines if diet and exercise are not working.  If you smoke, find out from your health care provider how to quit. If you do not use tobacco, do not start.  Lung cancer screening is recommended for adults aged 22-80 years who are at high risk for developing lung cancer because of a history of smoking. A yearly low-dose CT scan of the lungs is recommended for people who have at least a 30-pack-year history of smoking and are a current smoker or have quit within the past 15 years. A pack year of smoking is smoking an average of 1 pack of cigarettes a day for 1 year (for example: 1 pack a day for 30 years or 2 packs a day for 15 years). Yearly screening should continue until the smoker has stopped smoking for at least 15 years. Yearly screening should be stopped for people who develop a health problem that would prevent them from having lung cancer treatment.  If you are pregnant, do not drink alcohol. If you are breastfeeding,  be very cautious about drinking alcohol. If you are not pregnant and choose to drink alcohol, do not have more than 1 drink per day. One drink is considered to be 12 ounces (355 mL) of beer, 5 ounces (148 mL) of wine, or 1.5 ounces (44 mL) of liquor.  Avoid use of street drugs. Do not share needles with anyone. Ask for help if you need support or instructions about stopping the use of drugs.  High blood pressure causes heart disease and increases the risk of  stroke. Your blood pressure should be checked at least every 1 to 2 years. Ongoing high blood pressure should be treated with medicines if weight loss and exercise do not work.  If you are 75-52 years old, ask your health care provider if you should take aspirin to prevent strokes.  Diabetes screening involves taking a blood sample to check your fasting blood sugar level. This should be done once every 3 years, after age 15, if you are within normal weight and without risk factors for diabetes. Testing should be considered at a younger age or be carried out more frequently if you are overweight and have at least 1 risk factor for diabetes.  Breast cancer screening is essential preventive care for women. You should practice "breast self-awareness." This means understanding the normal appearance and feel of your breasts and may include breast self-examination. Any changes detected, no matter how small, should be reported to a health care provider. Women in their 58s and 30s should have a clinical breast exam (CBE) by a health care provider as part of a regular health exam every 1 to 3 years. After age 16, women should have a CBE every year. Starting at age 53, women should consider having a mammogram (breast X-ray test) every year. Women who have a family history of breast cancer should talk to their health care provider about genetic screening. Women at a high risk of breast cancer should talk to their health care providers about having an MRI and a mammogram every year.  Breast cancer gene (BRCA)-related cancer risk assessment is recommended for women who have family members with BRCA-related cancers. BRCA-related cancers include breast, ovarian, tubal, and peritoneal cancers. Having family members with these cancers may be associated with an increased risk for harmful changes (mutations) in the breast cancer genes BRCA1 and BRCA2. Results of the assessment will determine the need for genetic counseling and  BRCA1 and BRCA2 testing.  Routine pelvic exams to screen for cancer are no longer recommended for nonpregnant women who are considered low risk for cancer of the pelvic organs (ovaries, uterus, and vagina) and who do not have symptoms. Ask your health care provider if a screening pelvic exam is right for you.  If you have had past treatment for cervical cancer or a condition that could lead to cancer, you need Pap tests and screening for cancer for at least 20 years after your treatment. If Pap tests have been discontinued, your risk factors (such as having a new sexual partner) need to be reassessed to determine if screening should be resumed. Some women have medical problems that increase the chance of getting cervical cancer. In these cases, your health care provider may recommend more frequent screening and Pap tests.  The HPV test is an additional test that may be used for cervical cancer screening. The HPV test looks for the virus that can cause the cell changes on the cervix. The cells collected during the Pap test can be  tested for HPV. The HPV test could be used to screen women aged 30 years and older, and should be used in women of any age who have unclear Pap test results. After the age of 30, women should have HPV testing at the same frequency as a Pap test.  Colorectal cancer can be detected and often prevented. Most routine colorectal cancer screening begins at the age of 50 years and continues through age 75 years. However, your health care provider may recommend screening at an earlier age if you have risk factors for colon cancer. On a yearly basis, your health care provider may provide home test kits to check for hidden blood in the stool. Use of a small camera at the end of a tube, to directly examine the colon (sigmoidoscopy or colonoscopy), can detect the earliest forms of colorectal cancer. Talk to your health care provider about this at age 50, when routine screening begins. Direct  exam of the colon should be repeated every 5-10 years through age 75 years, unless early forms of pre-cancerous polyps or small growths are found.  People who are at an increased risk for hepatitis B should be screened for this virus. You are considered at high risk for hepatitis B if:  You were born in a country where hepatitis B occurs often. Talk with your health care provider about which countries are considered high risk.  Your parents were born in a high-risk country and you have not received a shot to protect against hepatitis B (hepatitis B vaccine).  You have HIV or AIDS.  You use needles to inject street drugs.  You live with, or have sex with, someone who has hepatitis B.  You get hemodialysis treatment.  You take certain medicines for conditions like cancer, organ transplantation, and autoimmune conditions.  Hepatitis C blood testing is recommended for all people born from 1945 through 1965 and any individual with known risks for hepatitis C.  Practice safe sex. Use condoms and avoid high-risk sexual practices to reduce the spread of sexually transmitted infections (STIs). STIs include gonorrhea, chlamydia, syphilis, trichomonas, herpes, HPV, and human immunodeficiency virus (HIV). Herpes, HIV, and HPV are viral illnesses that have no cure. They can result in disability, cancer, and death.  You should be screened for sexually transmitted illnesses (STIs) including gonorrhea and chlamydia if:  You are sexually active and are younger than 24 years.  You are older than 24 years and your health care provider tells you that you are at risk for this type of infection.  Your sexual activity has changed since you were last screened and you are at an increased risk for chlamydia or gonorrhea. Ask your health care provider if you are at risk.  If you are at risk of being infected with HIV, it is recommended that you take a prescription medicine daily to prevent HIV infection. This is  called preexposure prophylaxis (PrEP). You are considered at risk if:  You are a heterosexual woman, are sexually active, and are at increased risk for HIV infection.  You take drugs by injection.  You are sexually active with a partner who has HIV.  Talk with your health care provider about whether you are at high risk of being infected with HIV. If you choose to begin PrEP, you should first be tested for HIV. You should then be tested every 3 months for as long as you are taking PrEP.  Osteoporosis is a disease in which the bones lose minerals and strength   with aging. This can result in serious bone fractures or breaks. The risk of osteoporosis can be identified using a bone density scan. Women ages 65 years and over and women at risk for fractures or osteoporosis should discuss screening with their health care providers. Ask your health care provider whether you should take a calcium supplement or vitamin D to reduce the rate of osteoporosis.  Menopause can be associated with physical symptoms and risks. Hormone replacement therapy is available to decrease symptoms and risks. You should talk to your health care provider about whether hormone replacement therapy is right for you.  Use sunscreen. Apply sunscreen liberally and repeatedly throughout the day. You should seek shade when your shadow is shorter than you. Protect yourself by wearing long sleeves, pants, a wide-brimmed hat, and sunglasses year round, whenever you are outdoors.  Once a month, do a whole body skin exam, using a mirror to look at the skin on your back. Tell your health care provider of new moles, moles that have irregular borders, moles that are larger than a pencil eraser, or moles that have changed in shape or color.  Stay current with required vaccines (immunizations).  Influenza vaccine. All adults should be immunized every year.  Tetanus, diphtheria, and acellular pertussis (Td, Tdap) vaccine. Pregnant women should  receive 1 dose of Tdap vaccine during each pregnancy. The dose should be obtained regardless of the length of time since the last dose. Immunization is preferred during the 27th-36th week of gestation. An adult who has not previously received Tdap or who does not know her vaccine status should receive 1 dose of Tdap. This initial dose should be followed by tetanus and diphtheria toxoids (Td) booster doses every 10 years. Adults with an unknown or incomplete history of completing a 3-dose immunization series with Td-containing vaccines should begin or complete a primary immunization series including a Tdap dose. Adults should receive a Td booster every 10 years.  Varicella vaccine. An adult without evidence of immunity to varicella should receive 2 doses or a second dose if she has previously received 1 dose. Pregnant females who do not have evidence of immunity should receive the first dose after pregnancy. This first dose should be obtained before leaving the health care facility. The second dose should be obtained 4-8 weeks after the first dose.  Human papillomavirus (HPV) vaccine. Females aged 13-26 years who have not received the vaccine previously should obtain the 3-dose series. The vaccine is not recommended for use in pregnant females. However, pregnancy testing is not needed before receiving a dose. If a female is found to be pregnant after receiving a dose, no treatment is needed. In that case, the remaining doses should be delayed until after the pregnancy. Immunization is recommended for any person with an immunocompromised condition through the age of 26 years if she did not get any or all doses earlier. During the 3-dose series, the second dose should be obtained 4-8 weeks after the first dose. The third dose should be obtained 24 weeks after the first dose and 16 weeks after the second dose.  Zoster vaccine. One dose is recommended for adults aged 60 years or older unless certain conditions are  present.  Measles, mumps, and rubella (MMR) vaccine. Adults born before 1957 generally are considered immune to measles and mumps. Adults born in 1957 or later should have 1 or more doses of MMR vaccine unless there is a contraindication to the vaccine or there is laboratory evidence of immunity to   each of the three diseases. A routine second dose of MMR vaccine should be obtained at least 28 days after the first dose for students attending postsecondary schools, health care workers, or international travelers. People who received inactivated measles vaccine or an unknown type of measles vaccine during 1963-1967 should receive 2 doses of MMR vaccine. People who received inactivated mumps vaccine or an unknown type of mumps vaccine before 1979 and are at high risk for mumps infection should consider immunization with 2 doses of MMR vaccine. For females of childbearing age, rubella immunity should be determined. If there is no evidence of immunity, females who are not pregnant should be vaccinated. If there is no evidence of immunity, females who are pregnant should delay immunization until after pregnancy. Unvaccinated health care workers born before 1957 who lack laboratory evidence of measles, mumps, or rubella immunity or laboratory confirmation of disease should consider measles and mumps immunization with 2 doses of MMR vaccine or rubella immunization with 1 dose of MMR vaccine.  Pneumococcal 13-valent conjugate (PCV13) vaccine. When indicated, a person who is uncertain of her immunization history and has no record of immunization should receive the PCV13 vaccine. An adult aged 19 years or older who has certain medical conditions and has not been previously immunized should receive 1 dose of PCV13 vaccine. This PCV13 should be followed with a dose of pneumococcal polysaccharide (PPSV23) vaccine. The PPSV23 vaccine dose should be obtained at least 8 weeks after the dose of PCV13 vaccine. An adult aged 19  years or older who has certain medical conditions and previously received 1 or more doses of PPSV23 vaccine should receive 1 dose of PCV13. The PCV13 vaccine dose should be obtained 1 or more years after the last PPSV23 vaccine dose.  Pneumococcal polysaccharide (PPSV23) vaccine. When PCV13 is also indicated, PCV13 should be obtained first. All adults aged 65 years and older should be immunized. An adult younger than age 65 years who has certain medical conditions should be immunized. Any person who resides in a nursing home or long-term care facility should be immunized. An adult smoker should be immunized. People with an immunocompromised condition and certain other conditions should receive both PCV13 and PPSV23 vaccines. People with human immunodeficiency virus (HIV) infection should be immunized as soon as possible after diagnosis. Immunization during chemotherapy or radiation therapy should be avoided. Routine use of PPSV23 vaccine is not recommended for American Indians, [place] Natives, or people younger than 65 years unless there are medical conditions that require PPSV23 vaccine. When indicated, people who have unknown immunization and have no record of immunization should receive PPSV23 vaccine. One-time revaccination 5 years after the first dose of PPSV23 is recommended for people aged 19-64 years who have chronic kidney failure, nephrotic syndrome, asplenia, or immunocompromised conditions. People who received 1-2 doses of PPSV23 before age 65 years should receive another dose of PPSV23 vaccine at age 65 years or later if at least 5 years have passed since the previous dose. Doses of PPSV23 are not needed for people immunized with PPSV23 at or after age 65 years.  Meningococcal vaccine. Adults with asplenia or persistent complement component deficiencies should receive 2 doses of quadrivalent meningococcal conjugate (MenACWY-D) vaccine. The doses should be obtained at least 2 months apart.  Microbiologists working with certain meningococcal bacteria, military recruits, people at risk during an outbreak, and people who travel to or live in countries with a high rate of meningitis should be immunized. A first-year college student up through age   21 years who is living in a residence hall should receive a dose if she did not receive a dose on or after her 16th birthday. Adults who have certain high-risk conditions should receive one or more doses of vaccine.  Hepatitis A vaccine. Adults who wish to be protected from this disease, have certain high-risk conditions, work with hepatitis A-infected animals, work in hepatitis A research labs, or travel to or work in countries with a high rate of hepatitis A should be immunized. Adults who were previously unvaccinated and who anticipate close contact with an international adoptee during the first 60 days after arrival in the [place] from a country with a high rate of hepatitis A should be immunized.  Hepatitis B vaccine. Adults who wish to be protected from this disease, have certain high-risk conditions, may be exposed to blood or other infectious body fluids, are household contacts or sex partners of hepatitis B positive people, are clients or workers in certain care facilities, or travel to or work in countries with a high rate of hepatitis B should be immunized.  Haemophilus influenzae type b (Hib) vaccine. A previously unvaccinated person with asplenia or sickle cell disease or having a scheduled splenectomy should receive 1 dose of Hib vaccine. Regardless of previous immunization, a recipient of a hematopoietic stem cell transplant should receive a 3-dose series 6-12 months after her successful transplant. Hib vaccine is not recommended for adults with HIV infection. Preventive Services / Frequency Ages 64 to 68 years  Blood pressure check.** / Every 1 to 2 years.  Lipid and cholesterol check.** / Every 5 years beginning at age  22.  Clinical breast exam.** / Every 3 years for women in their 88s and 53s.  BRCA-related cancer risk assessment.** / For women who have family members with a BRCA-related cancer (breast, ovarian, tubal, or peritoneal cancers).  Pap test.** / Every 2 years from ages 90 through 51. Every 3 years starting at age 21 through age 56 or 3 with a history of 3 consecutive normal Pap tests.  HPV screening.** / Every 3 years from ages 24 through ages 1 to 46 with a history of 3 consecutive normal Pap tests.  Hepatitis C blood test.** / For any individual with known risks for hepatitis C.  Skin self-exam. / Monthly.  Influenza vaccine. / Every year.  Tetanus, diphtheria, and acellular pertussis (Tdap, Td) vaccine.** / Consult your health care provider. Pregnant women should receive 1 dose of Tdap vaccine during each pregnancy. 1 dose of Td every 10 years.  Varicella vaccine.** / Consult your health care provider. Pregnant females who do not have evidence of immunity should receive the first dose after pregnancy.  HPV vaccine. / 3 doses over 6 months, if 72 and younger. The vaccine is not recommended for use in pregnant females. However, pregnancy testing is not needed before receiving a dose.  Measles, mumps, rubella (MMR) vaccine.** / You need at least 1 dose of MMR if you were born in 1957 or later. You may also need a 2nd dose. For females of childbearing age, rubella immunity should be determined. If there is no evidence of immunity, females who are not pregnant should be vaccinated. If there is no evidence of immunity, females who are pregnant should delay immunization until after pregnancy.  Pneumococcal 13-valent conjugate (PCV13) vaccine.** / Consult your health care provider.  Pneumococcal polysaccharide (PPSV23) vaccine.** / 1 to 2 doses if you smoke cigarettes or if you have certain conditions.  Meningococcal vaccine.** /  1 dose if you are age 19 to 21 years and a first-year college  student living in a residence hall, or have one of several medical conditions, you need to get vaccinated against meningococcal disease. You may also need additional booster doses.  Hepatitis A vaccine.** / Consult your health care provider.  Hepatitis B vaccine.** / Consult your health care provider.  Haemophilus influenzae type b (Hib) vaccine.** / Consult your health care provider. Ages 40 to 64 years  Blood pressure check.** / Every 1 to 2 years.  Lipid and cholesterol check.** / Every 5 years beginning at age 20 years.  Lung cancer screening. / Every year if you are aged 55-80 years and have a 30-pack-year history of smoking and currently smoke or have quit within the past 15 years. Yearly screening is stopped once you have quit smoking for at least 15 years or develop a health problem that would prevent you from having lung cancer treatment.  Clinical breast exam.** / Every year after age 40 years.  BRCA-related cancer risk assessment.** / For women who have family members with a BRCA-related cancer (breast, ovarian, tubal, or peritoneal cancers).  Mammogram.** / Every year beginning at age 40 years and continuing for as long as you are in good health. Consult with your health care provider.  Pap test.** / Every 3 years starting at age 30 years through age 65 or 70 years with a history of 3 consecutive normal Pap tests.  HPV screening.** / Every 3 years from ages 30 years through ages 65 to 70 years with a history of 3 consecutive normal Pap tests.  Fecal occult blood test (FOBT) of stool. / Every year beginning at age 50 years and continuing until age 75 years. You may not need to do this test if you get a colonoscopy every 10 years.  Flexible sigmoidoscopy or colonoscopy.** / Every 5 years for a flexible sigmoidoscopy or every 10 years for a colonoscopy beginning at age 50 years and continuing until age 75 years.  Hepatitis C blood test.** / For all people born from 1945 through  1965 and any individual with known risks for hepatitis C.  Skin self-exam. / Monthly.  Influenza vaccine. / Every year.  Tetanus, diphtheria, and acellular pertussis (Tdap/Td) vaccine.** / Consult your health care provider. Pregnant women should receive 1 dose of Tdap vaccine during each pregnancy. 1 dose of Td every 10 years.  Varicella vaccine.** / Consult your health care provider. Pregnant females who do not have evidence of immunity should receive the first dose after pregnancy.  Zoster vaccine.** / 1 dose for adults aged 60 years or older.  Measles, mumps, rubella (MMR) vaccine.** / You need at least 1 dose of MMR if you were born in 1957 or later. You may also need a 2nd dose. For females of childbearing age, rubella immunity should be determined. If there is no evidence of immunity, females who are not pregnant should be vaccinated. If there is no evidence of immunity, females who are pregnant should delay immunization until after pregnancy.  Pneumococcal 13-valent conjugate (PCV13) vaccine.** / Consult your health care provider.  Pneumococcal polysaccharide (PPSV23) vaccine.** / 1 to 2 doses if you smoke cigarettes or if you have certain conditions.  Meningococcal vaccine.** / Consult your health care provider.  Hepatitis A vaccine.** / Consult your health care provider.  Hepatitis B vaccine.** / Consult your health care provider.  Haemophilus influenzae type b (Hib) vaccine.** / Consult your health care provider. Ages 65   years and over  Blood pressure check.** / Every 1 to 2 years.  Lipid and cholesterol check.** / Every 5 years beginning at age 22 years.  Lung cancer screening. / Every year if you are aged 73-80 years and have a 30-pack-year history of smoking and currently smoke or have quit within the past 15 years. Yearly screening is stopped once you have quit smoking for at least 15 years or develop a health problem that would prevent you from having lung cancer  treatment.  Clinical breast exam.** / Every year after age 4 years.  BRCA-related cancer risk assessment.** / For women who have family members with a BRCA-related cancer (breast, ovarian, tubal, or peritoneal cancers).  Mammogram.** / Every year beginning at age 40 years and continuing for as long as you are in good health. Consult with your health care provider.  Pap test.** / Every 3 years starting at age 9 years through age 34 or 91 years with 3 consecutive normal Pap tests. Testing can be stopped between 65 and 70 years with 3 consecutive normal Pap tests and no abnormal Pap or HPV tests in the past 10 years.  HPV screening.** / Every 3 years from ages 57 years through ages 64 or 45 years with a history of 3 consecutive normal Pap tests. Testing can be stopped between 65 and 70 years with 3 consecutive normal Pap tests and no abnormal Pap or HPV tests in the past 10 years.  Fecal occult blood test (FOBT) of stool. / Every year beginning at age 15 years and continuing until age 17 years. You may not need to do this test if you get a colonoscopy every 10 years.  Flexible sigmoidoscopy or colonoscopy.** / Every 5 years for a flexible sigmoidoscopy or every 10 years for a colonoscopy beginning at age 86 years and continuing until age 71 years.  Hepatitis C blood test.** / For all people born from [birth year] through 1964 and any individual with known risks for hepatitis C.  Osteoporosis screening.** / A one-time screening for women ages 83 years and over and women at risk for fractures or osteoporosis.  Skin self-exam. / Monthly.  Influenza vaccine. / Every year.  Tetanus, diphtheria, and acellular pertussis (Tdap/Td) vaccine.** / 1 dose of Td every 10 years.  Varicella vaccine.** / Consult your health care provider.  Zoster vaccine.** / 1 dose for adults aged 61 years or older.  Pneumococcal 13-valent conjugate (PCV13) vaccine.** / Consult your health care provider.  Pneumococcal  polysaccharide (PPSV23) vaccine.** / 1 dose for all adults aged 28 years and older.  Meningococcal vaccine.** / Consult your health care provider.  Hepatitis A vaccine.** / Consult your health care provider.  Hepatitis B vaccine.** / Consult your health care provider.  Haemophilus influenzae type b (Hib) vaccine.** / Consult your health care provider. ** Family history and personal history of risk and conditions may change your health care provider's recommendations. Document Released: 09/23/2001 Document Revised: 12/12/2013 Document Reviewed: 12/23/2010 [facility] Patient Information 2015 [place], [place]. This information is not intended to replace advice given to you by your health care provider. Make sure you discuss any questions you have with your health care provider.

## 2014-06-21 ENCOUNTER — Telehealth: Payer: Self-pay | Admitting: Family Medicine

## 2014-06-21 NOTE — Telephone Encounter (Signed)
error 

## 2014-06-21 NOTE — Telephone Encounter (Signed)
Caller name: Anatasia Relation to pt: self Call back number: [phone] Pharmacy:  Reason for call:   Patient would like to know if she could get the shingles inj at tomorrows lab visit? She states that she did call her insurance and that her ins will cover everything but $25 copay.

## 2014-06-22 ENCOUNTER — Other Ambulatory Visit (INDEPENDENT_AMBULATORY_CARE_PROVIDER_SITE_OTHER): Payer: BC Managed Care – PPO

## 2014-06-22 DIAGNOSIS — Z23 Encounter for immunization: Secondary | ICD-10-CM

## 2014-06-22 DIAGNOSIS — Z0000 Encounter for general adult medical examination without abnormal findings: Secondary | ICD-10-CM

## 2014-06-22 LAB — LIPID PANEL
Cholesterol: 156 mg/dL (ref 0–200)
HDL: 61.4 mg/dL (ref >39.00)
LDL CALC: 87 mg/dL (ref 0–99)
NONHDL: 94.6
Total CHOL/HDL Ratio: 3
Triglycerides: 40 mg/dL (ref 0.0–149.0)
VLDL: 8 mg/dL (ref 0.0–40.0)

## 2014-06-22 LAB — CBC WITH DIFFERENTIAL/PLATELET
BASOS ABS: 0 10*3/uL (ref 0.0–0.1)
Basophils Relative: 0.6 % (ref 0.0–3.0)
Eosinophils Absolute: 0.1 10*3/uL (ref 0.0–0.7)
Eosinophils Relative: 3.6 % (ref 0.0–5.0)
HCT: 39.6 % (ref 36.0–46.0)
Hemoglobin: 13.3 g/dL (ref 12.0–15.0)
LYMPHS PCT: 42.9 % (ref 12.0–46.0)
Lymphs Abs: 1.3 10*3/uL (ref 0.7–4.0)
MCHC: 33.5 g/dL (ref 30.0–36.0)
MCV: 94.5 fl (ref 78.0–100.0)
MONOS PCT: 10.1 % (ref 3.0–12.0)
Monocytes Absolute: 0.3 10*3/uL (ref 0.1–1.0)
Neutro Abs: 1.3 10*3/uL — ABNORMAL LOW (ref 1.4–7.7)
Neutrophils Relative %: 42.8 % — ABNORMAL LOW (ref 43.0–77.0)
Platelets: 220 10*3/uL (ref 150.0–400.0)
RBC: 4.19 Mil/uL (ref 3.87–5.11)
RDW: 13.2 % (ref 11.5–15.5)
WBC: 3.1 10*3/uL — ABNORMAL LOW (ref 4.0–10.5)

## 2014-06-22 LAB — BASIC METABOLIC PANEL
BUN: 17 mg/dL (ref 6–23)
CO2: 24 mEq/L (ref 19–32)
CREATININE: 0.8 mg/dL (ref 0.4–1.2)
Calcium: 8.6 mg/dL (ref 8.4–10.5)
Chloride: 106 mEq/L (ref 96–112)
GFR: 75.12 mL/min (ref >60.00)
Glucose, Bld: 93 mg/dL (ref 70–99)
Potassium: 3.7 mEq/L (ref 3.5–5.1)
Sodium: 138 mEq/L (ref 135–145)

## 2014-06-22 LAB — HEPATIC FUNCTION PANEL
ALBUMIN: 3.2 g/dL — AB (ref 3.5–5.2)
ALK PHOS: 55 U/L (ref 39–117)
ALT: 13 U/L (ref 0–35)
AST: 20 U/L (ref 0–37)
Bilirubin, Direct: 0 mg/dL (ref 0.0–0.3)
Total Bilirubin: 0.3 mg/dL (ref 0.2–1.2)
Total Protein: 6.6 g/dL (ref 6.0–8.3)

## 2014-06-22 LAB — TSH: TSH: 1.6 u[IU]/mL (ref 0.35–4.50)

## 2014-06-22 LAB — POCT URINALYSIS DIPSTICK

## 2014-06-22 NOTE — Telephone Encounter (Signed)
Please advise      KP 

## 2014-06-22 NOTE — Telephone Encounter (Signed)
That is fine 

## 2014-07-17 ENCOUNTER — Other Ambulatory Visit: Payer: Self-pay | Admitting: Family Medicine

## 2014-08-02 ENCOUNTER — Encounter: Payer: Self-pay | Admitting: Medical

## 2014-08-02 ENCOUNTER — Ambulatory Visit (INDEPENDENT_AMBULATORY_CARE_PROVIDER_SITE_OTHER): Payer: BC Managed Care – PPO | Admitting: Medical

## 2014-08-02 VITALS — BP 122/78 | HR 104 | Temp 98.5°F | Ht 64.75 in | Wt 170.6 lb

## 2014-08-02 DIAGNOSIS — J0100 Acute maxillary sinusitis, unspecified: Secondary | ICD-10-CM

## 2014-08-02 MED ORDER — BENZONATATE 100 MG PO CAPS: 100.0000 mg | ORAL_CAPSULE | Freq: Three times a day (TID) | ORAL | Status: DC | PRN

## 2014-08-02 MED ORDER — CLARITHROMYCIN ER 500 MG PO TB24: 1000.0000 mg | ORAL_TABLET | Freq: Every day | ORAL | Status: DC

## 2014-08-02 NOTE — Patient Instructions (Signed)
Your appear to have a sinus infection. I am prescribing biaxin  antibiotic for the infection. To help with the nasal congestion I want you to use your nasal steroid. For your associated cough, I prescribed cough medicine benzonatate.  Rest, hydrate, tylenol for fever.  Follow up in 7 days or as needed.

## 2014-08-02 NOTE — Progress Notes (Signed)
Subjective:    Patient ID: [name], female    DOB: Mar 15, 1956, 58 y.o.   MRN: [id number]  HPI   Pt in today reporting  cough, nasal congestion, sinus pressure and runny nose pnd and now hoarse voice  for 10  Days.  Hoarse voice for 5 days.   Pt hs hx of sinus infections pretty easily about one time a year and this time of the year.  Pt not smoker.  Associated symptoms( below yes or no)  Fever-no  Chills-no. Some sweats Chest congestion-yes Sneezing- no Itching eyes-no Sore throat- yes, mild Post-nasal drainage-yes Wheezing-no Purulent  Nasal drainage-no Fatigue-mild  Past Medical History  Diagnosis Date  . Allergy   . Allergic rhinitis     History   Social History  . Marital Status: Married    Spouse Name: N/A    Number of Children: 1  . Years of Education: N/A   Occupational History  . investment banking    Social History Main Topics  . Smoking status: Never Smoker   . Smokeless tobacco: Never Used  . Alcohol Use: 0.0 oz/week     Comment: rare  . Drug Use: No  . Sexual Activity:    Partners: Male   Other Topics Concern  . Not on file   Social History Narrative   Exercise--no    Past Surgical History  Procedure Laterality Date  . Breast biopsy  11-2000    benign  . Vaginal hysterectomy    . Oophorectomy      Family History  Problem Relation Age of Onset  . Breast cancer Mother   . Hypertension Mother   . Stroke Mother   . Pancreatitis Father     Allergies  Allergen Reactions  . Penicillins   . Sulfonamide Derivatives     Current Outpatient Prescriptions on File Prior to Visit  Medication Sig Dispense Refill  . ASTEPRO 0.15 % SOLN INSTILL 2 SPRAYS IN EACH NOSTRIL DAILY. 30 mL 2  . Estriol POWD     . fluticasone (FLONASE) 50 MCG/ACT nasal spray PLACE 2 SPRAYS INTO THE NOSE DAILY. 16 g 1  . levocetirizine (XYZAL) 5 MG tablet 1 tab by mouth daily- 30 tablet 11   No current facility-administered medications on file prior to  visit.    BP 122/78 mmHg  Pulse 104  Temp(Src) 98.5 F (36.9 C) (Oral)  Ht 5' 4.75" (1.645 m)  Wt 170 lb 9.6 oz (77.384 kg)  BMI 28.60 kg/m2  SpO2 97%        Review of Systems  Constitutional: Positive for diaphoresis. Negative for fever, chills and fatigue.  HENT: Positive for congestion, postnasal drip, sinus pressure and sore throat.   Respiratory: Positive for cough. Negative for wheezing.   Cardiovascular: Negative for chest pain and palpitations.  Musculoskeletal: Negative for neck pain.  Neurological: Negative for dizziness and headaches.  Hematological: Negative for adenopathy. Does not bruise/bleed easily.       Objective:   Physical Exam   General  Mental Status - Alert. General Appearance - Well groomed. Not in acute distress.  Skin Rashes- No Rashes.  HEENT Head- Normal. Ear Auditory Canal - Left- Normal. Right - Normal.Tympanic Membrane- Left- Normal. Right- Normal. Eye Sclera/Conjunctiva- Left- Normal. Right- Normal. Nose & Sinuses Nasal Mucosa- Left-  Boggy and Congested. Right-  Boggy and  Congested.Bilateral maxillary and frontal sinus pressure. Mouth & Throat Lips: Upper Lip- Normal: no dryness, cracking, pallor, cyanosis, or vesicular eruption. Lower Lip-Normal: no  dryness, cracking, pallor, cyanosis or vesicular eruption. Buccal Mucosa- Bilateral- No Aphthous ulcers. Oropharynx- No Discharge or Erythema. Tonsils: Characteristics- Bilateral- No Erythema or Congestion. Size/Enlargement- Bilateral- No enlargement. Discharge- bilateral-None.  Neck Neck- Supple. No Masses.   Chest and Lung Exam Auscultation: Breath Sounds:-Clear even and unlabored.  Cardiovascular Auscultation:Rythm- Regular, rate and rhythm. Murmurs & Other Heart Sounds:Ausculatation of the heart reveal- No Murmurs.  Lymphatic Head & Neck General Head & Neck Lymphatics: Bilateral: Description- No Localized lymphadenopathy.         Assessment & Plan:

## 2014-08-02 NOTE — Assessment & Plan Note (Signed)
Your appear to have a sinus infection. I am prescribing biaxin  antibiotic for the infection. To help with the nasal congestion I want you to use your nasal steroid. For your associated cough, I prescribed cough medicine benzonatate.

## 2014-08-02 NOTE — Progress Notes (Signed)
Pre visit review using our clinic review tool, if applicable. No additional management support is needed unless otherwise documented below in the visit note. 

## 2014-09-20 ENCOUNTER — Other Ambulatory Visit: Payer: Self-pay | Admitting: Obstetrics and Gynecology

## 2014-09-21 LAB — CYTOLOGY - PAP

## 2014-11-25 ENCOUNTER — Other Ambulatory Visit: Payer: Self-pay | Admitting: Family Medicine

## 2014-12-04 ENCOUNTER — Other Ambulatory Visit: Payer: Self-pay | Admitting: Family Medicine

## 2015-03-23 ENCOUNTER — Other Ambulatory Visit: Payer: Self-pay | Admitting: Family Medicine

## 2015-06-07 ENCOUNTER — Encounter: Payer: Self-pay | Admitting: Family Medicine

## 2015-06-07 ENCOUNTER — Ambulatory Visit (INDEPENDENT_AMBULATORY_CARE_PROVIDER_SITE_OTHER): Payer: BLUE CROSS/BLUE SHIELD | Admitting: Family Medicine

## 2015-06-07 VITALS — BP 118/76 | HR 79 | Temp 98.0°F | Ht 64.75 in | Wt 174.5 lb

## 2015-06-07 DIAGNOSIS — J0110 Acute frontal sinusitis, unspecified: Secondary | ICD-10-CM

## 2015-06-07 MED ORDER — CLARITHROMYCIN ER 500 MG PO TB24
1000.0000 mg | ORAL_TABLET | Freq: Every day | ORAL | Status: AC
Start: 2015-06-07 — End: 2015-06-17

## 2015-06-07 NOTE — Progress Notes (Signed)
Pre visit review using our clinic review tool, if applicable. No additional management support is needed unless otherwise documented below in the visit note. 

## 2015-06-07 NOTE — Patient Instructions (Signed)

## 2015-06-07 NOTE — Progress Notes (Signed)
  Subjective:     [name] is a 59 y.o. female who presents for evaluation of sinus pain. Symptoms include: congestion, cough, facial pain, fevers, headaches, nasal congestion, post nasal drip, purulent rhinorrhea, sinus pressure and sore throat. Onset of symptoms was 2 weeks ago. Symptoms have been gradually worsening since that time. Past history is significant for no history of pneumonia or bronchitis. Patient is a non-smoker.  The following portions of the patient's history were reviewed and updated as appropriate:  She  has a past medical history of Allergy and Allergic rhinitis. She  does not have any pertinent problems on file. She  has past surgical history that includes Breast biopsy (11-2000); Vaginal hysterectomy; and Oophorectomy. Her family history includes Breast cancer in her mother; Hypertension in her mother; Pancreatitis in her father; Stroke in her mother. She  reports that she has never smoked. She has never used smokeless tobacco. She reports that she drinks alcohol. She reports that she does not use illicit drugs. She has a current medication list which includes the following prescription(s): astepro, estriol, fluticasone, and levocetirizine. Current Outpatient Prescriptions on File Prior to Visit  Medication Sig Dispense Refill  . ASTEPRO 0.15 % SOLN INSTILL 2 SPRAYS IN EACH NOSTRIL DAILY. 30 mL 2  . Estriol POWD     . fluticasone (FLONASE) 50 MCG/ACT nasal spray PLACE 2 SPRAYS INTO THE NOSE DAILY. 16 g 1  . levocetirizine (XYZAL) 5 MG tablet TAKE 1 TABLET BY MOUTH DAILY. 30 tablet 11   No current facility-administered medications on file prior to visit.   She is allergic to penicillins and sulfonamide derivatives..  Review of Systems Pertinent items are noted in HPI.   Objective:    BP 118/76 mmHg  Pulse 79  Temp(Src) 98 F (36.7 C) (Oral)  Ht 5' 4.75" (1.645 m)  Wt 174 lb 8 oz (79.153 kg)  BMI 29.25 kg/m2  SpO2 98% General appearance: alert,  cooperative, appears stated age and no distress Head: Normocephalic, without obvious abnormality, atraumatic Ears: normal TM's and external ear canals both ears Nose: green discharge, moderate congestion, turbinates red, swollen, sinus tenderness bilateral Throat: abnormal findings: moderate oropharyngeal erythema Neck: moderate anterior cervical adenopathy, supple, symmetrical, trachea midline and thyroid not enlarged, symmetric, no tenderness/mass/nodules Lungs: clear to auscultation bilaterally Heart: regular rate and rhythm, S1, S2 normal, no murmur, click, rub or gallop Extremities: extremities normal, atraumatic, no cyanosis or edema    Assessment:    Acute bacterial sinusitis.    Plan:    Nasal steroids per medication orders. Antihistamines per medication orders. Biaxin per medication orders.

## 2015-06-26 ENCOUNTER — Telehealth: Payer: Self-pay | Admitting: Family Medicine

## 2015-06-26 ENCOUNTER — Other Ambulatory Visit: Payer: Self-pay | Admitting: Family Medicine

## 2015-06-26 MED ORDER — LEVOFLOXACIN 500 MG PO TABS: 500.0000 mg | ORAL_TABLET | Freq: Every day | ORAL | Status: DC

## 2015-06-26 NOTE — Telephone Encounter (Signed)
Caller name: Self   Can be reached: [phone] Pharmacy:  [facility] - [place], [place] - [address] [phone] (Phone) [phone] (Fax)         Reason for call: Patient still have Sinus Headaches and request more medication

## 2015-06-26 NOTE — Telephone Encounter (Signed)
levaquin sent-- will need ov if no better

## 2015-06-26 NOTE — Telephone Encounter (Signed)
Pt states that she is getting the sinus headaches more frequently throughout the day and would like to know if she can have another round of ABT. Please advise.

## 2015-06-26 NOTE — Telephone Encounter (Signed)
Patient is aware and verbalized understanding.      KP 

## 2015-08-02 ENCOUNTER — Encounter: Payer: Self-pay | Admitting: Medical

## 2015-08-02 ENCOUNTER — Ambulatory Visit (INDEPENDENT_AMBULATORY_CARE_PROVIDER_SITE_OTHER): Payer: BLUE CROSS/BLUE SHIELD | Admitting: Medical

## 2015-08-02 ENCOUNTER — Encounter (INDEPENDENT_AMBULATORY_CARE_PROVIDER_SITE_OTHER): Payer: Self-pay

## 2015-08-02 VITALS — BP 116/76 | HR 81 | Temp 98.0°F | Ht 64.75 in | Wt 172.4 lb

## 2015-08-02 DIAGNOSIS — J0100 Acute maxillary sinusitis, unspecified: Secondary | ICD-10-CM

## 2015-08-02 MED ORDER — CLARITHROMYCIN ER 500 MG PO TB24: 1000.0000 mg | ORAL_TABLET | Freq: Every day | ORAL | Status: DC

## 2015-08-02 MED ORDER — BENZONATATE 200 MG PO CAPS: 200.0000 mg | ORAL_CAPSULE | Freq: Three times a day (TID) | ORAL | Status: DC | PRN

## 2015-08-02 NOTE — Progress Notes (Signed)
Subjective:    Patient ID: Jeanice LimJoyce K Eakes, female    DOB: August 04, 1956, 59 y.o.   MRN: [id number]  HPI  Pt in for sinus pain for about 3 days. Began with mild st. She states pain over maxillary and frontal. Pt states a lot runny nose and this was early on. She states this time feels like started uri. But now feels like sinus infection. Blows nose get colored mucous.   Review of Systems  Constitutional: Positive for chills. Negative for fever and fatigue.  HENT: Positive for congestion, rhinorrhea and sinus pressure. Negative for ear pain.   Respiratory: Positive for cough. Negative for shortness of breath and wheezing.   Cardiovascular: Negative for chest pain and palpitations.  Musculoskeletal: Negative for back pain.  Neurological: Negative for dizziness and headaches.  Hematological: Negative for adenopathy. Does not bruise/bleed easily.     Past Medical History  Diagnosis Date  . Allergy   . Allergic rhinitis     Social History   Social History  . Marital Status: Married    Spouse Name: N/A  . Number of Children: 1  . Years of Education: N/A   Occupational History  . investment banking    Social History Main Topics  . Smoking status: Never Smoker   . Smokeless tobacco: Never Used  . Alcohol Use: 0.0 oz/week     Comment: rare  . Drug Use: No  . Sexual Activity:    Partners: Male   Other Topics Concern  . Not on file   Social History Narrative   Exercise--no    Past Surgical History  Procedure Laterality Date  . Breast biopsy  11-2000    benign  . Vaginal hysterectomy    . Oophorectomy      Family History  Problem Relation Age of Onset  . Breast cancer Mother   . Hypertension Mother   . Stroke Mother   . Pancreatitis Father     Allergies  Allergen Reactions  . Penicillins   . Sulfonamide Derivatives     Current Outpatient Prescriptions on File Prior to Visit  Medication Sig Dispense Refill  . ASTEPRO 0.15 % SOLN INSTILL 2 SPRAYS IN EACH  NOSTRIL DAILY. 30 mL 2  . Estriol POWD     . fluticasone (FLONASE) 50 MCG/ACT nasal spray PLACE 2 SPRAYS INTO THE NOSE DAILY. 16 g 1  . levocetirizine (XYZAL) 5 MG tablet TAKE 1 TABLET BY MOUTH DAILY. 30 tablet 11   No current facility-administered medications on file prior to visit.    BP 116/76 mmHg  Pulse 81  Temp(Src) 98 F (36.7 C) (Oral)  Ht 5' 4.75" (1.645 m)  Wt 172 lb 6.4 oz (78.2 kg)  BMI 28.90 kg/m2  SpO2 98%       Objective:   Physical Exam  General  Mental Status - Alert. General Appearance - Well groomed. Not in acute distress.  Skin Rashes- No Rashes.  HEENT Head- Normal. Ear Auditory Canal - Left- Normal. Right - Normal.Tympanic Membrane- Left- Normal. Right- Normal. Eye Sclera/Conjunctiva- Left- Normal. Right- Normal. Nose & Sinuses Nasal Mucosa- Left-  Boggy and Congested. Right-  Boggy and  Congested.Bilateral maxillary and frontal sinus pressure. Mouth & Throat Lips: Upper Lip- Normal: no dryness, cracking, pallor, cyanosis, or vesicular eruption. Lower Lip-Normal: no dryness, cracking, pallor, cyanosis or vesicular eruption. Buccal Mucosa- Bilateral- No Aphthous ulcers. Oropharynx- No Discharge or Erythema. Tonsils: Characteristics- Bilateral- No Erythema or Congestion. Size/Enlargement- Bilateral- No enlargement. Discharge- bilateral-None.  Neck Neck-  Supple. No Masses.   Chest and Lung Exam Auscultation: Breath Sounds:-Clear even and unlabored.  Cardiovascular Auscultation:Rythm- Regular, rate and rhythm. Murmurs & Other Heart Sounds:Ausculatation of the heart reveal- No Murmurs.  Lymphatic Head & Neck General Head & Neck Lymphatics: Bilateral: Description- No Localized lymphadenopathy.       Assessment & Plan:  You  appear to have a sinus infection preceded by URI. I am prescribing biaxin  antibiotic for the infection. To help with the nasal congestion continue your  nasal steroid. For your associated cough, I prescribed cough  medicine benzonatate.  Rest, hydrate, tylenol for fever.  Follow up in 7 days or as needed

## 2015-08-02 NOTE — Progress Notes (Signed)
Pre visit review using our clinic review tool, if applicable. No additional management support is needed unless otherwise documented below in the visit note. 

## 2015-08-02 NOTE — Patient Instructions (Addendum)
You appear to have a sinus infection preceded by URI. I am prescribing biaxin  antibiotic for the infection. To help with the nasal congestion continue your  nasal steroid. For your associated cough, I prescribed cough medicine benzonatate.  Rest, hydrate, tylenol for fever.  Follow up in 7 days or as needed.

## 2015-08-03 ENCOUNTER — Telehealth: Payer: Self-pay | Admitting: Family Medicine

## 2015-08-03 NOTE — Telephone Encounter (Signed)
Chart noted.     KP 

## 2015-08-03 NOTE — Telephone Encounter (Signed)
Pt declined the flu vac ° °

## 2015-08-23 ENCOUNTER — Telehealth: Payer: Self-pay | Admitting: Family Medicine

## 2015-08-23 ENCOUNTER — Ambulatory Visit (INDEPENDENT_AMBULATORY_CARE_PROVIDER_SITE_OTHER): Payer: BLUE CROSS/BLUE SHIELD | Admitting: Medical

## 2015-08-23 ENCOUNTER — Encounter: Payer: Self-pay | Admitting: Medical

## 2015-08-23 VITALS — BP 118/76 | HR 77 | Temp 98.1°F | Ht 64.75 in | Wt 178.8 lb

## 2015-08-23 DIAGNOSIS — J0100 Acute maxillary sinusitis, unspecified: Secondary | ICD-10-CM

## 2015-08-23 MED ORDER — LEVOFLOXACIN 500 MG PO TABS: 500.0000 mg | ORAL_TABLET | Freq: Every day | ORAL | Status: DC

## 2015-08-23 NOTE — Progress Notes (Signed)
Pre visit review using our clinic review tool, if applicable. No additional management support is needed unless otherwise documented below in the visit note. 

## 2015-08-23 NOTE — Telephone Encounter (Signed)
Can be reached: [phone] [facility]  Reason for call: Pt called to see if she can have a refill on antibiotics (or different antibiotic). She saw Ramon Dredgedward 12/22 for sinus infection and it did not completely clear up and is getting worse again.

## 2015-08-23 NOTE — Telephone Encounter (Signed)
Please have pt come in for a follow up appointment.

## 2015-08-23 NOTE — Telephone Encounter (Signed)
Almost 3 wks since last seen. She needs to be rechecked in office that is a long time.

## 2015-08-23 NOTE — Patient Instructions (Addendum)
For your apparent recurrent sinus infection will prescribe levofloxin for 10 days. Please take probiotic  while on the antibiotic. Continue your nasal spray.  Call us in 10 days for update. I want to see 100% improvement by then. Update us sooner if symptoms worsen or change.   If symptoms persist may need to extend antibiotic course, refer to ent or get ct of sinus.

## 2015-08-23 NOTE — Telephone Encounter (Signed)
[name] please advise if pt will need to come in again for a follow up.

## 2015-08-23 NOTE — Progress Notes (Signed)
Subjective:    Patient ID: [name], female    DOB: 1955/11/04, 60 y.o.   MRN: [id number]  HPI  Pt states she almost got better from last visit. I gave her biaxin. By end of course for sinus infection she felt a lot better. But gradually got worse again with nasal congestion and sinus pressure.   Nasal congestion again. Pt has no teeth pain. Pt states when does netty pot it is not draining as usual.  No fever chills or sweats.    Review of Systems  Constitutional: Negative for fever, chills and fatigue.  HENT: Positive for congestion and sinus pressure. Negative for postnasal drip.   Respiratory: Negative for cough, chest tightness, shortness of breath and wheezing.   Cardiovascular: Negative for chest pain and palpitations.  Neurological: Negative for dizziness and headaches.  Hematological: Negative for adenopathy. Does not bruise/bleed easily.  Psychiatric/Behavioral: Negative for behavioral problems and confusion.    Past Medical History  Diagnosis Date  . Allergy   . Allergic rhinitis     Social History   Social History  . Marital Status: Married    Spouse Name: N/A  . Number of Children: 1  . Years of Education: N/A   Occupational History  . investment banking    Social History Main Topics  . Smoking status: Never Smoker   . Smokeless tobacco: Never Used  . Alcohol Use: 0.0 oz/week     Comment: rare  . Drug Use: No  . Sexual Activity:    Partners: Male   Other Topics Concern  . Not on file   Social History Narrative   Exercise--no    Past Surgical History  Procedure Laterality Date  . Breast biopsy  11-2000    benign  . Vaginal hysterectomy    . Oophorectomy      Family History  Problem Relation Age of Onset  . Breast cancer Mother   . Hypertension Mother   . Stroke Mother   . Pancreatitis Father     Allergies  Allergen Reactions  . Penicillins   . Sulfonamide Derivatives     Current Outpatient Prescriptions on File Prior to  Visit  Medication Sig Dispense Refill  . ASTEPRO 0.15 % SOLN INSTILL 2 SPRAYS IN EACH NOSTRIL DAILY. 30 mL 2  . benzonatate (TESSALON) 200 MG capsule Take 1 capsule (200 mg total) by mouth 3 (three) times daily as needed for cough. 30 capsule 0  . Estriol POWD     . fluticasone (FLONASE) 50 MCG/ACT nasal spray PLACE 2 SPRAYS INTO THE NOSE DAILY. 16 g 1  . levocetirizine (XYZAL) 5 MG tablet TAKE 1 TABLET BY MOUTH DAILY. 30 tablet 11   No current facility-administered medications on file prior to visit.    BP 118/76 mmHg  Pulse 77  Temp(Src) 98.1 F (36.7 C) (Oral)  Ht 5' 4.75" (1.645 m)  Wt 178 lb 12.8 oz (81.103 kg)  BMI 29.97 kg/m2  SpO2 98%       Objective:   Physical Exam  General  Mental Status - Alert. General Appearance - Well groomed. Not in acute distress.  Skin Rashes- No Rashes.  HEENT Head- Normal. Ear Auditory Canal - Left- Normal. Right - Normal.Tympanic Membrane- Left- Normal. Right- Normal. Eye Sclera/Conjunctiva- Left- Normal. Right- Normal. Nose & Sinuses Nasal Mucosa- Left-  Boggy and Congested. Right-  Boggy and  Congested.Bilateral maxillary and frontal sinus pressure. Mouth & Throat Lips: Upper Lip- Normal: no dryness, cracking, pallor, cyanosis, or  vesicular eruption. Lower Lip-Normal: no dryness, cracking, pallor, cyanosis or vesicular eruption. Buccal Mucosa- Bilateral- No Aphthous ulcers. Oropharynx- No Discharge or Erythema. Tonsils: Characteristics- Bilateral- No Erythema or Congestion. Size/Enlargement- Bilateral- No enlargement. Discharge- bilateral-None.  Neck Neck- Supple. No Masses.   Chest and Lung Exam Auscultation: Breath Sounds:-Clear even and unlabored.  Cardiovascular Auscultation:Rythm- Regular, rate and rhythm. Murmurs & Other Heart Sounds:Ausculatation of the heart reveal- No Murmurs.  Lymphatic Head & Neck General Head & Neck Lymphatics: Bilateral: Description- No Localized lymphadenopathy.       Assessment &  Plan:  For your apparent recurrent sinus infection will prescribe levofloxin for 10 days. Please take probiotic  while on the antibiotic. Continue your nasal spray.  Call us in 10 days for update. I want to see 100% improvement by then. Update [place] sooner if symptoms worsen or change.   If symptoms persist may need to extend antibiotic course, refer to ent or get ct of sinus.

## 2015-09-05 ENCOUNTER — Telehealth: Payer: Self-pay | Admitting: Family Medicine

## 2015-09-05 NOTE — Telephone Encounter (Signed)
Caller name: Self   Reason for call: Patient calling to inform you that she is feeling 100% better after taking the antibiotic that was given to her.

## 2015-09-14 ENCOUNTER — Other Ambulatory Visit: Payer: Self-pay | Admitting: Family Medicine

## 2015-09-20 ENCOUNTER — Ambulatory Visit (INDEPENDENT_AMBULATORY_CARE_PROVIDER_SITE_OTHER): Payer: BLUE CROSS/BLUE SHIELD | Admitting: Podiatry

## 2015-09-20 ENCOUNTER — Encounter: Payer: Self-pay | Admitting: Podiatry

## 2015-09-20 VITALS — BP 119/68 | HR 68 | Resp 12

## 2015-09-20 DIAGNOSIS — Q828 Other specified congenital malformations of skin: Secondary | ICD-10-CM

## 2015-09-20 NOTE — Progress Notes (Signed)
Subjective:     Patient ID: [name], female   DOB: 1955-11-11, 60 y.o.   MRN: [id number]  HPI patient presents stating I have this lesion on the bottom my left foot that's been very tender and it's been there for around 6 months. It's gotten worse over the last several months and I don't remember trauma   Review of Systems  All other systems reviewed and are negative.      Objective:   Physical Exam  Constitutional: She is oriented to person, place, and time.  Cardiovascular: Intact distal pulses.   Musculoskeletal: Normal range of motion.  Neurological: She is oriented to person, place, and time.  Skin: Skin is warm.  Nursing note and vitals reviewed.  neurovascular status found to be intact muscle strength adequate range of motion within normal limits with patient found to have a painful keratotic lesion plantar aspect left foot in the distal portion. It is very sore when pressed and makes walking difficult and I did note good digital perfusion and patient is well oriented 3     Assessment:     Lesion may be secondary to porokeratotic component left foot    Plan:     H&P conditions reviewed and went ahead today and debrided the lesion fully and applied salicylic acid. Gave instructions on keeping it dry for 24 hours and reappoint as needed

## 2015-09-20 NOTE — Progress Notes (Signed)
   Subjective:    Patient ID: [name], female    DOB: [birth year], 60 y.o.   MRN: [id number]  HPI  LT BALL OF THE FOOT HAVE  HARD SKIN AND BEEN HURTING FOR 3 MONTHS. FOOT IS GETTING WORSE ESPECIALLY WHEN PUTTING PRESSURE. TRIED  NO TREATMENT.  Review of Systems  All other systems reviewed and are negative.      Objective:   Physical Exam        Assessment & Plan:

## 2015-10-19 ENCOUNTER — Ambulatory Visit (INDEPENDENT_AMBULATORY_CARE_PROVIDER_SITE_OTHER): Payer: BLUE CROSS/BLUE SHIELD | Admitting: Family

## 2015-10-19 ENCOUNTER — Ambulatory Visit: Payer: BLUE CROSS/BLUE SHIELD | Admitting: Family Medicine

## 2015-10-19 ENCOUNTER — Encounter: Payer: Self-pay | Admitting: Family

## 2015-10-19 VITALS — BP 116/68 | HR 87 | Temp 100.0°F | Resp 16 | Ht 64.75 in | Wt 174.8 lb

## 2015-10-19 DIAGNOSIS — R509 Fever, unspecified: Secondary | ICD-10-CM | POA: Diagnosis not present

## 2015-10-19 DIAGNOSIS — J019 Acute sinusitis, unspecified: Secondary | ICD-10-CM

## 2015-10-19 DIAGNOSIS — J101 Influenza due to other identified influenza virus with other respiratory manifestations: Secondary | ICD-10-CM

## 2015-10-19 DIAGNOSIS — H6692 Otitis media, unspecified, left ear: Secondary | ICD-10-CM

## 2015-10-19 LAB — POCT INFLUENZA A/B
INFLUENZA A, POC: NEGATIVE
INFLUENZA B, POC: POSITIVE — AB

## 2015-10-19 MED ORDER — AZITHROMYCIN 250 MG PO TABS: ORAL_TABLET | ORAL | Status: DC

## 2015-10-19 NOTE — Progress Notes (Signed)
Subjective:    Patient ID: [name], female    DOB: October 02, 1955, 60 y.o.   MRN: [id number]  HPI   [name] is a 60 yr old female who presents today with several symptoms. Sinus pressure began on Monday/Tuesday of this week.  She initially had sore throat and cough, then developed chills.  She reports that yesterday she took dayquil.  No significant sinus drainage today- however has had sinus pain/pressure for.1 week. She reports that her "whole head hurts". Reports + cervical LAD.  Reports intermittent fever Tmax 100.7. Daughter was sick recently (diagnosed + for flu) and returned to work today.     Review of Systems See HPI  Past Medical History  Diagnosis Date  . Allergy   . Allergic rhinitis     Social History   Social History  . Marital Status: Married    Spouse Name: N/A  . Number of Children: 1  . Years of Education: N/A   Occupational History  . investment banking    Social History Main Topics  . Smoking status: Never Smoker   . Smokeless tobacco: Never Used  . Alcohol Use: 0.0 oz/week     Comment: rare  . Drug Use: No  . Sexual Activity:    Partners: Male   Other Topics Concern  . Not on file   Social History Narrative   Exercise--no    Past Surgical History  Procedure Laterality Date  . Breast biopsy  11-2000    benign  . Vaginal hysterectomy    . Oophorectomy      Family History  Problem Relation Age of Onset  . Breast cancer Mother   . Hypertension Mother   . Stroke Mother   . Pancreatitis Father     Allergies  Allergen Reactions  . Penicillins   . Sulfonamide Derivatives     Current Outpatient Prescriptions on File Prior to Visit  Medication Sig Dispense Refill  . ASTEPRO 0.15 % SOLN INSTILL 2 SPRAYS IN EACH NOSTRIL DAILY. 30 mL 2  . Estriol POWD     . fluticasone (FLONASE) 50 MCG/ACT nasal spray PLACE 2 SPRAYS INTO THE NOSE DAILY. 16 g 2  . levocetirizine (XYZAL) 5 MG tablet TAKE 1 TABLET BY MOUTH DAILY. 30 tablet 11   No  current facility-administered medications on file prior to visit.    BP 116/68 mmHg  Pulse 87  Temp(Src) 100 F (37.8 C) (Oral)  Resp 16  Ht 5' 4.75" (1.645 m)  Wt 174 lb 12.8 oz (79.289 kg)  BMI 29.30 kg/m2  SpO2 98%       Objective:   Physical Exam  Constitutional: She is oriented to person, place, and time. She appears well-developed and well-nourished.  HENT:  Head: Normocephalic and atraumatic.  Right Ear: Tympanic membrane and ear canal normal.  Left Ear: Ear canal normal. Tympanic membrane is erythematous.  Nose: Right sinus exhibits maxillary sinus tenderness and frontal sinus tenderness. Left sinus exhibits maxillary sinus tenderness and frontal sinus tenderness.  Cardiovascular: Normal rate, regular rhythm and normal heart sounds.   No murmur heard. Pulmonary/Chest: Effort normal and breath sounds normal. No respiratory distress. She has no wheezes.  Musculoskeletal: She exhibits no edema.  Lymphadenopathy:    She has cervical adenopathy.  Neurological: She is alert and oriented to person, place, and time.  Skin: Skin is warm and dry.  Psychiatric: She has a normal mood and affect. Her behavior is normal. Judgment and thought content normal.  Assessment & Plan:  L otitis media-  Rx with zpak.  Sinusitis- Rx with Zpak  Influenza B- + rapid flu swab.  She is outside tamiflu window. Discussed supportive measures, call if symptoms worsen or if symptoms do not improve.  Pt verbalizes understanding

## 2015-10-19 NOTE — Progress Notes (Signed)
Pre visit review using our clinic review tool, if applicable. No additional management support is needed unless otherwise documented below in the visit note. 

## 2015-10-19 NOTE — Patient Instructions (Addendum)
You are positive for Influenza B. Start Zpak for Left sided ear infection and sinusitis. Call if symptoms worsen or if symptoms are not improved in 3 days.  Influenza, Adult Influenza ("the flu") is a viral infection of the respiratory tract. It occurs more often in winter months because people spend more time in close contact with one another. Influenza can make you feel very sick. Influenza easily spreads from person to person (contagious). CAUSES  Influenza is caused by a virus that infects the respiratory tract. You can catch the virus by breathing in droplets from an infected person's cough or sneeze. You can also catch the virus by touching something that was recently contaminated with the virus and then touching your mouth, nose, or eyes. RISKS AND COMPLICATIONS You may be at risk for a more severe case of influenza if you smoke cigarettes, have diabetes, have chronic heart disease (such as heart failure) or lung disease (such as asthma), or if you have a weakened immune system. Elderly people and pregnant women are also at risk for more serious infections. The most common problem of influenza is a lung infection (pneumonia). Sometimes, this problem can require emergency medical care and may be life threatening. SIGNS AND SYMPTOMS  Symptoms typically last 4 to 10 days and may include:  Fever.  Chills.  Headache, body aches, and muscle aches.  Sore throat.  Chest discomfort and cough.  Poor appetite.  Weakness or feeling tired.  Dizziness.  Nausea or vomiting. DIAGNOSIS  Diagnosis of influenza is often made based on your history and a physical exam. A nose or throat swab test can be done to confirm the diagnosis. TREATMENT  In mild cases, influenza goes away on its own. Treatment is directed at relieving symptoms. For more severe cases, your health care provider may prescribe antiviral medicines to shorten the sickness. Antibiotic medicines are not effective because the  infection is caused by a virus, not by bacteria. HOME CARE INSTRUCTIONS  Take medicines only as directed by your health care provider.  Use a cool mist humidifier to make breathing easier.  Get plenty of rest until your temperature returns to normal. This usually takes 3 to 4 days.  Drink enough fluid to keep your urine clear or pale yellow.  Cover yourmouth and nosewhen coughing or sneezing,and wash your handswellto prevent thevirusfrom spreading.  Stay homefromwork orschool untilthe fever is gonefor at least 551full day. PREVENTION  An annual influenza vaccination (flu shot) is the best way to avoid getting influenza. An annual flu shot is now routinely recommended for all adults in the [place] SEEK MEDICAL CARE IF:  You experiencechest pain, yourcough worsens,or you producemore mucus.  Youhave nausea,vomiting, ordiarrhea.  Your fever returns or gets worse. SEEK IMMEDIATE MEDICAL CARE IF:  You havetrouble breathing, you become short of breath,or your skin ornails becomebluish.  You have severe painor stiffnessin the neck.  You develop a sudden headache, or pain in the face or ear.  You have nausea or vomiting that you cannot control. MAKE SURE YOU:   Understand these instructions.  Will watch your condition.  Will get help right away if you are not doing well or get worse.   This information is not intended to replace advice given to you by your health care provider. Make sure you discuss any questions you have with your health care provider.   Document Released: 07/25/2000 Document Revised: 08/18/2014 Document Reviewed: 10/27/2011 Elsevier Interactive Patient Education [employer]2016 Elsevier Inc.

## 2015-10-22 ENCOUNTER — Telehealth: Payer: Self-pay | Admitting: Family Medicine

## 2015-10-22 MED ORDER — LEVOFLOXACIN 500 MG PO TABS: 500.0000 mg | ORAL_TABLET | Freq: Every day | ORAL | Status: DC

## 2015-10-22 NOTE — Telephone Encounter (Signed)
Notified pt and she voices understanding. 

## 2015-10-22 NOTE — Telephone Encounter (Signed)
D/c zpak, start levaquin, if not improved in 48 hrs or if symptoms worsen, needs follow up in office.

## 2015-10-22 NOTE — Telephone Encounter (Signed)
Pt states that she is feeling worse and has taken 3 days of zpack. She said that she has a sinus headache and ear pressure that is the worst part. She also has congestion and blowing out infection. She feels she needs something stronger. Pt phone # [phone].   Pharmacy:     [facility] - , [place] - [address]

## 2015-11-03 ENCOUNTER — Other Ambulatory Visit: Payer: Self-pay | Admitting: Family Medicine

## 2015-11-22 ENCOUNTER — Encounter: Payer: Self-pay | Admitting: Family Medicine

## 2015-11-22 ENCOUNTER — Ambulatory Visit (INDEPENDENT_AMBULATORY_CARE_PROVIDER_SITE_OTHER): Payer: BLUE CROSS/BLUE SHIELD | Admitting: Family Medicine

## 2015-11-22 VITALS — BP 120/70 | HR 90 | Temp 98.8°F | Ht 64.75 in | Wt 174.8 lb

## 2015-11-22 DIAGNOSIS — J0140 Acute pansinusitis, unspecified: Secondary | ICD-10-CM | POA: Diagnosis not present

## 2015-11-22 DIAGNOSIS — J019 Acute sinusitis, unspecified: Secondary | ICD-10-CM

## 2015-11-22 DIAGNOSIS — M79601 Pain in right arm: Secondary | ICD-10-CM | POA: Diagnosis not present

## 2015-11-22 MED ORDER — TRAMADOL HCL 50 MG PO TABS: 50.0000 mg | ORAL_TABLET | Freq: Three times a day (TID) | ORAL | Status: DC | PRN

## 2015-11-22 MED ORDER — CLARITHROMYCIN ER 500 MG PO TB24
1000.0000 mg | ORAL_TABLET | Freq: Every day | ORAL | Status: AC
Start: 2015-11-22 — End: 2015-12-02

## 2015-11-22 NOTE — Progress Notes (Signed)
Pre visit review using our clinic review tool, if applicable. No additional management support is needed unless otherwise documented below in the visit note. 

## 2015-11-22 NOTE — Assessment & Plan Note (Signed)
con't flonase and astelin con't xyzal biaxin xl  rto prn

## 2015-11-22 NOTE — Progress Notes (Signed)
Patient ID: [name], female    DOB: Jan 17, 1956  Age: 60 y.o. MRN: [id number]    Subjective:  Subjective HPI [name] presents for c/o sinus infection.  She was dx with flu 3/10 and never really got completely better.  Symptoms worsened about 10 days ago.  No fever.   + nasal congestion, + sinus pressure.  No cough.   Pt also c/o R upper arm pain x 1 month.  No known injury but it progressively getting worse .  She even has pain pulling up pants in bathroom .    Review of Systems  Constitutional: Positive for chills. Negative for fever.  HENT: Positive for congestion, postnasal drip, rhinorrhea and sinus pressure.   Respiratory: Negative for cough, chest tightness, shortness of breath and wheezing.   Cardiovascular: Negative for chest pain, palpitations and leg swelling.  Musculoskeletal:       R arm pain  Allergic/Immunologic: Negative for environmental allergies.    History Past Medical History  Diagnosis Date  . Allergy   . Allergic rhinitis     She has past surgical history that includes Breast biopsy (11-2000); Vaginal hysterectomy; and Oophorectomy.   Her family history includes Breast cancer in her mother; Hypertension in her mother; Pancreatitis in her father; Stroke in her mother.She reports that she has never smoked. She has never used smokeless tobacco. She reports that she drinks alcohol. She reports that she does not use illicit drugs.  Current Outpatient Prescriptions on File Prior to Visit  Medication Sig Dispense Refill  . Azelastine HCl 0.15 % SOLN INSTILL 2 SPRAYS IN EACH NOSTRIL DAILY. 30 mL 2  . Estriol POWD     . fluticasone (FLONASE) 50 MCG/ACT nasal spray PLACE 2 SPRAYS INTO THE NOSE DAILY. 16 g 2  . levocetirizine (XYZAL) 5 MG tablet TAKE 1 TABLET BY MOUTH DAILY. 30 tablet 11   No current facility-administered medications on file prior to visit.     Objective:  Objective Physical Exam  Constitutional: She is oriented to person, place, and  time. She appears well-developed and well-nourished.  HENT:  Head: Normocephalic and atraumatic.  Right Ear: External ear normal.  Left Ear: External ear normal.  Nose: Right sinus exhibits maxillary sinus tenderness and frontal sinus tenderness. Left sinus exhibits maxillary sinus tenderness and frontal sinus tenderness.  Mouth/Throat: Posterior oropharyngeal erythema present. No posterior oropharyngeal edema.  + PND + errythema  Eyes: Conjunctivae and EOM are normal. Right eye exhibits no discharge. Left eye exhibits no discharge.  Neck: Normal range of motion. Neck supple. No JVD present. Carotid bruit is not present. No thyromegaly present.  Cardiovascular: Normal rate, regular rhythm and normal heart sounds.   No murmur heard. Pulmonary/Chest: Effort normal and breath sounds normal. No respiratory distress. She has no wheezes. She has no rales. She exhibits no tenderness.  Musculoskeletal: She exhibits no edema.  Lymphadenopathy:    She has cervical adenopathy.  Neurological: She is alert and oriented to person, place, and time.  Psychiatric: She has a normal mood and affect. Her behavior is normal.  Nursing note and vitals reviewed.  BP 120/70 mmHg  Pulse 90  Temp(Src) 98.8 F (37.1 C) (Oral)  Ht 5' 4.75" (1.645 m)  Wt 174 lb 12.8 oz (79.289 kg)  BMI 29.30 kg/m2  SpO2 98% Wt Readings from Last 3 Encounters:  11/22/15 174 lb 12.8 oz (79.289 kg)  10/19/15 174 lb 12.8 oz (79.289 kg)  08/23/15 178 lb 12.8 oz (81.103 kg)  Lab Results  Component Value Date   WBC 3.1* 06/22/2014   HGB 13.3 06/22/2014   HCT 39.6 06/22/2014   PLT 220.0 06/22/2014   GLUCOSE 93 06/22/2014   CHOL 156 06/22/2014   TRIG 40.0 06/22/2014   HDL 61.40 06/22/2014   LDLCALC 87 06/22/2014   ALT 13 06/22/2014   AST 20 06/22/2014   NA 138 06/22/2014   K 3.7 06/22/2014   CL 106 06/22/2014   CREATININE 0.8 06/22/2014   BUN 17 06/22/2014   CO2 24 06/22/2014   TSH 1.60 06/22/2014    No results  found.   Assessment & Plan:  Plan I have discontinued Ms. Tonkinson's levofloxacin. I am also having her start on clarithromycin and traMADol. Additionally, I am having her maintain her Estriol, levocetirizine, fluticasone, and Azelastine HCl.  Meds ordered this encounter  Medications  . clarithromycin (BIAXIN XL) 500 MG 24 hr tablet    Sig: Take 2 tablets (1,000 mg total) by mouth daily.    Dispense:  28 tablet    Refill:  0  . traMADol (ULTRAM) 50 MG tablet    Sig: Take 1 tablet (50 mg total) by mouth every 8 (eight) hours as needed.    Dispense:  30 tablet    Refill:  0    Problem List Items Addressed This Visit      Unprioritized   SINUSITIS- ACUTE-NOS    con't flonase and astelin con't xyzal biaxin xl  rto prn      Relevant Medications   clarithromycin (BIAXIN XL) 500 MG 24 hr tablet   Right arm pain    ? Hyperextended  Ultram for pain esp at night Refer to sport med       Relevant Medications   traMADol (ULTRAM) 50 MG tablet   Other Relevant Orders   Ambulatory referral to Sports Medicine    Other Visit Diagnoses    Acute pansinusitis, recurrence not specified    -  Primary    Relevant Medications    clarithromycin (BIAXIN XL) 500 MG 24 hr tablet       Follow-up: Return if symptoms worsen or fail to improve.  Donato Schultz, DO

## 2015-11-22 NOTE — Assessment & Plan Note (Signed)
?   Hyperextended  Ultram for pain esp at night Refer to sport med

## 2015-11-22 NOTE — Patient Instructions (Signed)

## 2015-11-28 DIAGNOSIS — Z1239 Encounter for other screening for malignant neoplasm of breast: Secondary | ICD-10-CM | POA: Diagnosis not present

## 2015-11-28 DIAGNOSIS — Z01419 Encounter for gynecological examination (general) (routine) without abnormal findings: Secondary | ICD-10-CM | POA: Diagnosis not present

## 2015-11-28 DIAGNOSIS — Z13820 Encounter for screening for osteoporosis: Secondary | ICD-10-CM | POA: Diagnosis not present

## 2015-12-03 ENCOUNTER — Telehealth: Payer: Self-pay | Admitting: Family Medicine

## 2015-12-03 NOTE — Telephone Encounter (Signed)
Appointment canceled.

## 2015-12-03 NOTE — Telephone Encounter (Signed)
Caller name: Self  Can be reached: [phone]     Reason for call: Patient called stating that she no longer needs the referral to Sports Medicine because she is no longer in pain.

## 2015-12-03 NOTE — Telephone Encounter (Signed)
[facility]     KP

## 2015-12-10 ENCOUNTER — Ambulatory Visit: Payer: BLUE CROSS/BLUE SHIELD | Admitting: Family Medicine

## 2015-12-28 ENCOUNTER — Encounter: Payer: BLUE CROSS/BLUE SHIELD | Admitting: Family Medicine

## 2016-01-08 ENCOUNTER — Other Ambulatory Visit: Payer: Self-pay | Admitting: Family Medicine

## 2016-03-20 ENCOUNTER — Encounter: Payer: Self-pay | Admitting: Family Medicine

## 2016-03-20 ENCOUNTER — Ambulatory Visit (INDEPENDENT_AMBULATORY_CARE_PROVIDER_SITE_OTHER): Payer: BLUE CROSS/BLUE SHIELD | Admitting: Family Medicine

## 2016-03-20 VITALS — BP 136/72 | HR 78 | Temp 98.0°F | Wt 174.6 lb

## 2016-03-20 DIAGNOSIS — Z0000 Encounter for general adult medical examination without abnormal findings: Secondary | ICD-10-CM

## 2016-03-20 DIAGNOSIS — Z1159 Encounter for screening for other viral diseases: Secondary | ICD-10-CM | POA: Diagnosis not present

## 2016-03-20 DIAGNOSIS — M79601 Pain in right arm: Secondary | ICD-10-CM

## 2016-03-20 NOTE — Progress Notes (Signed)
Pre visit review using our clinic review tool, if applicable. No additional management support is needed unless otherwise documented below in the visit note. 

## 2016-03-20 NOTE — Progress Notes (Signed)
Subjective:     [name] is a 60 y.o. female and is here for a comprehensive physical exam. The patient reports no problems.  Social History   Social History  . Marital status: Married    Spouse name: N/A  . Number of children: 1  . Years of education: N/A   Occupational History  . investment banking    Social History Main Topics  . Smoking status: Never Smoker  . Smokeless tobacco: Never Used  . Alcohol use 0.0 oz/week     Comment: rare  . Drug use: No  . Sexual activity: Yes    Partners: Male   Other Topics Concern  . Not on file   Social History Narrative   Exercise--no   Health Maintenance  Topic Date Due  . Janet BerlinETANUS/TDAP  07/26/2014  . Hepatitis C Screening  04/20/2016 (Originally 1955-09-30)  . HIV Screening  04/20/2016 (Originally 10/05/1971)  . INFLUENZA VACCINE  08/02/2016 (Originally 03/11/2016)  . COLONOSCOPY  06/25/2017  . PAP SMEAR  09/20/2017  . MAMMOGRAM  10/09/2017    The following portions of the patient's history were reviewed and updated as appropriate: She  has a past medical history of Allergic rhinitis and Allergy. She  does not have any pertinent problems on file. She  has a past surgical history that includes Breast biopsy (11-2000); Vaginal hysterectomy; and Oophorectomy. Her family history includes Breast cancer in her mother; Hypertension in her mother; Pancreatitis in her father; Stroke in her mother. She  reports that she has never smoked. She has never used smokeless tobacco. She reports that she drinks alcohol. She reports that she does not use drugs. She has a current medication list which includes the following prescription(s): azelastine hcl, estriol, fluticasone, levocetirizine, and tramadol. Current Outpatient Prescriptions on File Prior to Visit  Medication Sig Dispense Refill  . Azelastine HCl 0.15 % SOLN INSTILL 2 SPRAYS IN EACH NOSTRIL DAILY. 30 mL 2  . Estriol POWD     . fluticasone (FLONASE) 50 MCG/ACT nasal spray PLACE 2  SPRAYS INTO THE NOSE DAILY. 16 g 2  . levocetirizine (XYZAL) 5 MG tablet Take 1 tablet (5 mg total) by mouth daily. 30 tablet 5  . traMADol (ULTRAM) 50 MG tablet Take 1 tablet (50 mg total) by mouth every 8 (eight) hours as needed. 30 tablet 0   No current facility-administered medications on file prior to visit.    She is allergic to sulfonamide derivatives and penicillins..  Review of Systems Review of Systems  Constitutional: Negative for activity change, appetite change and fatigue.  HENT: Negative for hearing loss, congestion, tinnitus and ear discharge.  dentist [id number] Eyes: Negative for visual disturbance (see optho q1y -- vision corrected to 20/20 with glasses).  Respiratory: Negative for cough, chest tightness and shortness of breath.   Cardiovascular: Negative for chest pain, palpitations and leg swelling.  Gastrointestinal: Negative for abdominal pain, diarrhea, constipation and abdominal distention.  Genitourinary: Negative for urgency, frequency, decreased urine volume and difficulty urinating.  Musculoskeletal: Negative for back pain, arthralgias and gait problem.  Skin: Negative for color change, pallor and rash.  Neurological: Negative for dizziness, light-headedness, numbness and headaches.  Hematological: Negative for adenopathy. Does not bruise/bleed easily.  Psychiatric/Behavioral: Negative for suicidal ideas, confusion, sleep disturbance, self-injury, dysphoric mood, decreased concentration and agitation.       Objective:    BP 136/72 (BP Location: Left Arm, Patient Position: Sitting, Cuff Size: Normal)   Pulse 78   Temp 98 F (36.7  C) (Oral)   Wt 174 lb 9.6 oz (79.2 kg)   SpO2 100%   BMI 29.28 kg/m  General appearance: alert, cooperative and appears stated age Head: Normocephalic, without obvious abnormality, atraumatic Eyes: conjunctivae/corneas clear. PERRL, EOM's intact. Fundi benign. Ears: normal TM's and external ear canals both ears Nose: Nares  normal. Septum midline. Mucosa normal. No drainage or sinus tenderness. Throat: lips, mucosa, and tongue normal; teeth and gums normal Neck: no adenopathy, no carotid bruit, no JVD, supple, symmetrical, trachea midline and thyroid not enlarged, symmetric, no tenderness/mass/nodules Back: symmetric, no curvature. ROM normal. No CVA tenderness. Lungs: clear to auscultation bilaterally Breasts: normal appearance, no masses or tenderness Heart: S1, S2 normal Abdomen: soft, non-tender; bowel sounds normal; no masses,  no organomegaly Pelvic: not indicated; status post hysterectomy, negative ROS Extremities: extremities normal, atraumatic, no cyanosis or edema --R arm pain--no known injury Pulses: 2+ and symmetric Skin: Skin color, texture, turgor normal. No rashes or lesions Lymph nodes: Cervical, supraclavicular, and axillary nodes normal. Neurologic: Alert and oriented X 3, normal strength and tone. Normal symmetric reflexes. Normal coordination and gait     Assessment:    Healthy female exam.    Plan:    check labs ghm utd See After Visit Summary for Counseling Recommendations   1. Preventative health care See above - CBC with Differential/Platelet; Future - Lipid panel; Future - Comprehensive metabolic panel; Future - POCT urinalysis dipstick; Future - TSH; Future  2. Need for hepatitis C screening test   - Hepatitis C antibody; Future  3. Right arm pain   - Ambulatory referral to Sports Medicine

## 2016-03-20 NOTE — Patient Instructions (Signed)
Preventive Care for Adults, Female A healthy lifestyle and preventive care can promote health and wellness. Preventive health guidelines for women include the following key practices.  A routine yearly physical is a good way to check with your health care provider about your health and preventive screening. It is a chance to share any concerns and updates on your health and to receive a thorough exam.  Visit your dentist for a routine exam and preventive care every 6 months. Brush your teeth twice a day and floss once a day. Good oral hygiene prevents tooth decay and gum disease.  The frequency of eye exams is based on your age, health, family medical history, use of contact lenses, and other factors. Follow your health care provider's recommendations for frequency of eye exams.  Eat a healthy diet. Foods like vegetables, fruits, whole grains, low-fat dairy products, and lean protein foods contain the nutrients you need without too many calories. Decrease your intake of foods high in solid fats, added sugars, and salt. Eat the right amount of calories for you.Get information about a proper diet from your health care provider, if necessary.  Regular physical exercise is one of the most important things you can do for your health. Most adults should get at least 150 minutes of moderate-intensity exercise (any activity that increases your heart rate and causes you to sweat) each week. In addition, most adults need muscle-strengthening exercises on 2 or more days a week.  Maintain a healthy weight. The body mass index (BMI) is a screening tool to identify possible weight problems. It provides an estimate of body fat based on height and weight. Your health care provider can find your BMI and can help you achieve or maintain a healthy weight.For adults 20 years and older:  A BMI below 18.5 is considered underweight.  A BMI of 18.5 to 24.9 is normal.  A BMI of 25 to 29.9 is considered overweight.  A  BMI of 30 and above is considered obese.  Maintain normal blood lipids and cholesterol levels by exercising and minimizing your intake of saturated fat. Eat a balanced diet with plenty of fruit and vegetables. Blood tests for lipids and cholesterol should begin at age 45 and be repeated every 5 years. If your lipid or cholesterol levels are high, you are over 50, or you are at high risk for heart disease, you may need your cholesterol levels checked more frequently.Ongoing high lipid and cholesterol levels should be treated with medicines if diet and exercise are not working.  If you smoke, find out from your health care provider how to quit. If you do not use tobacco, do not start.  Lung cancer screening is recommended for adults aged 45-80 years who are at high risk for developing lung cancer because of a history of smoking. A yearly low-dose CT scan of the lungs is recommended for people who have at least a 30-pack-year history of smoking and are a current smoker or have quit within the past 15 years. A pack year of smoking is smoking an average of 1 pack of cigarettes a day for 1 year (for example: 1 pack a day for 30 years or 2 packs a day for 15 years). Yearly screening should continue until the smoker has stopped smoking for at least 15 years. Yearly screening should be stopped for people who develop a health problem that would prevent them from having lung cancer treatment.  If you are pregnant, do not drink alcohol. If you are  breastfeeding, be very cautious about drinking alcohol. If you are not pregnant and choose to drink alcohol, do not have more than 1 drink per day. One drink is considered to be 12 ounces (355 mL) of beer, 5 ounces (148 mL) of wine, or 1.5 ounces (44 mL) of liquor.  Avoid use of street drugs. Do not share needles with anyone. Ask for help if you need support or instructions about stopping the use of drugs.  High blood pressure causes heart disease and increases the risk  of stroke. Your blood pressure should be checked at least every 1 to 2 years. Ongoing high blood pressure should be treated with medicines if weight loss and exercise do not work.  If you are 55-79 years old, ask your health care provider if you should take aspirin to prevent strokes.  Diabetes screening is done by taking a blood sample to check your blood glucose level after you have not eaten for a certain period of time (fasting). If you are not overweight and you do not have risk factors for diabetes, you should be screened once every 3 years starting at age 45. If you are overweight or obese and you are 40-70 years of age, you should be screened for diabetes every year as part of your cardiovascular risk assessment.  Breast cancer screening is essential preventive care for women. You should practice "breast self-awareness." This means understanding the normal appearance and feel of your breasts and may include breast self-examination. Any changes detected, no matter how small, should be reported to a health care provider. Women in their 20s and 30s should have a clinical breast exam (CBE) by a health care provider as part of a regular health exam every 1 to 3 years. After age 40, women should have a CBE every year. Starting at age 40, women should consider having a mammogram (breast X-ray test) every year. Women who have a family history of breast cancer should talk to their health care provider about genetic screening. Women at a high risk of breast cancer should talk to their health care providers about having an MRI and a mammogram every year.  Breast cancer gene (BRCA)-related cancer risk assessment is recommended for women who have family members with BRCA-related cancers. BRCA-related cancers include breast, ovarian, tubal, and peritoneal cancers. Having family members with these cancers may be associated with an increased risk for harmful changes (mutations) in the breast cancer genes BRCA1 and  BRCA2. Results of the assessment will determine the need for genetic counseling and BRCA1 and BRCA2 testing.  Your health care provider may recommend that you be screened regularly for cancer of the pelvic organs (ovaries, uterus, and vagina). This screening involves a pelvic examination, including checking for microscopic changes to the surface of your cervix (Pap test). You may be encouraged to have this screening done every 3 years, beginning at age 21.  For women ages 30-65, health care providers may recommend pelvic exams and Pap testing every 3 years, or they may recommend the Pap and pelvic exam, combined with testing for human papilloma virus (HPV), every 5 years. Some types of HPV increase your risk of cervical cancer. Testing for HPV may also be done on women of any age with unclear Pap test results.  Other health care providers may not recommend any screening for nonpregnant women who are considered low risk for pelvic cancer and who do not have symptoms. Ask your health care provider if a screening pelvic exam is right for   you.  If you have had past treatment for cervical cancer or a condition that could lead to cancer, you need Pap tests and screening for cancer for at least 20 years after your treatment. If Pap tests have been discontinued, your risk factors (such as having a new sexual partner) need to be reassessed to determine if screening should resume. Some women have medical problems that increase the chance of getting cervical cancer. In these cases, your health care provider may recommend more frequent screening and Pap tests.  Colorectal cancer can be detected and often prevented. Most routine colorectal cancer screening begins at the age of 50 years and continues through age 75 years. However, your health care provider may recommend screening at an earlier age if you have risk factors for colon cancer. On a yearly basis, your health care provider may provide home test kits to check  for hidden blood in the stool. Use of a small camera at the end of a tube, to directly examine the colon (sigmoidoscopy or colonoscopy), can detect the earliest forms of colorectal cancer. Talk to your health care provider about this at age 50, when routine screening begins. Direct exam of the colon should be repeated every 5-10 years through age 75 years, unless early forms of precancerous polyps or small growths are found.  People who are at an increased risk for hepatitis B should be screened for this virus. You are considered at high risk for hepatitis B if:  You were born in a country where hepatitis B occurs often. Talk with your health care provider about which countries are considered high risk.  Your parents were born in a high-risk country and you have not received a shot to protect against hepatitis B (hepatitis B vaccine).  You have HIV or AIDS.  You use needles to inject street drugs.  You live with, or have sex with, someone who has hepatitis B.  You get hemodialysis treatment.  You take certain medicines for conditions like cancer, organ transplantation, and autoimmune conditions.  Hepatitis C blood testing is recommended for all people born from 1945 through 1965 and any individual with known risks for hepatitis C.  Practice safe sex. Use condoms and avoid high-risk sexual practices to reduce the spread of sexually transmitted infections (STIs). STIs include gonorrhea, chlamydia, syphilis, trichomonas, herpes, HPV, and human immunodeficiency virus (HIV). Herpes, HIV, and HPV are viral illnesses that have no cure. They can result in disability, cancer, and death.  You should be screened for sexually transmitted illnesses (STIs) including gonorrhea and chlamydia if:  You are sexually active and are younger than 24 years.  You are older than 24 years and your health care provider tells you that you are at risk for this type of infection.  Your sexual activity has changed  since you were last screened and you are at an increased risk for chlamydia or gonorrhea. Ask your health care provider if you are at risk.  If you are at risk of being infected with HIV, it is recommended that you take a prescription medicine daily to prevent HIV infection. This is called preexposure prophylaxis (PrEP). You are considered at risk if:  You are sexually active and do not regularly use condoms or know the HIV status of your partner(s).  You take drugs by injection.  You are sexually active with a partner who has HIV.  Talk with your health care provider about whether you are at high risk of being infected with HIV. If   you choose to begin PrEP, you should first be tested for HIV. You should then be tested every 3 months for as long as you are taking PrEP.  Osteoporosis is a disease in which the bones lose minerals and strength with aging. This can result in serious bone fractures or breaks. The risk of osteoporosis can be identified using a bone density scan. Women ages 67 years and over and women at risk for fractures or osteoporosis should discuss screening with their health care providers. Ask your health care provider whether you should take a calcium supplement or vitamin D to reduce the rate of osteoporosis.  Menopause can be associated with physical symptoms and risks. Hormone replacement therapy is available to decrease symptoms and risks. You should talk to your health care provider about whether hormone replacement therapy is right for you.  Use sunscreen. Apply sunscreen liberally and repeatedly throughout the day. You should seek shade when your shadow is shorter than you. Protect yourself by wearing long sleeves, pants, a wide-brimmed hat, and sunglasses year round, whenever you are outdoors.  Once a month, do a whole body skin exam, using a mirror to look at the skin on your back. Tell your health care provider of new moles, moles that have irregular borders, moles that  are larger than a pencil eraser, or moles that have changed in shape or color.  Stay current with required vaccines (immunizations).  Influenza vaccine. All adults should be immunized every year.  Tetanus, diphtheria, and acellular pertussis (Td, Tdap) vaccine. Pregnant women should receive 1 dose of Tdap vaccine during each pregnancy. The dose should be obtained regardless of the length of time since the last dose. Immunization is preferred during the 27th-36th week of gestation. An adult who has not previously received Tdap or who does not know her vaccine status should receive 1 dose of Tdap. This initial dose should be followed by tetanus and diphtheria toxoids (Td) booster doses every 10 years. Adults with an unknown or incomplete history of completing a 3-dose immunization series with Td-containing vaccines should begin or complete a primary immunization series including a Tdap dose. Adults should receive a Td booster every 10 years.  Varicella vaccine. An adult without evidence of immunity to varicella should receive 2 doses or a second dose if she has previously received 1 dose. Pregnant females who do not have evidence of immunity should receive the first dose after pregnancy. This first dose should be obtained before leaving the health care facility. The second dose should be obtained 4-8 weeks after the first dose.  Human papillomavirus (HPV) vaccine. Females aged 13-26 years who have not received the vaccine previously should obtain the 3-dose series. The vaccine is not recommended for use in pregnant females. However, pregnancy testing is not needed before receiving a dose. If a female is found to be pregnant after receiving a dose, no treatment is needed. In that case, the remaining doses should be delayed until after the pregnancy. Immunization is recommended for any person with an immunocompromised condition through the age of 61 years if she did not get any or all doses earlier. During the  3-dose series, the second dose should be obtained 4-8 weeks after the first dose. The third dose should be obtained 24 weeks after the first dose and 16 weeks after the second dose.  Zoster vaccine. One dose is recommended for adults aged 30 years or older unless certain conditions are present.  Measles, mumps, and rubella (MMR) vaccine. Adults born  before 1957 generally are considered immune to measles and mumps. Adults born in 1957 or later should have 1 or more doses of MMR vaccine unless there is a contraindication to the vaccine or there is laboratory evidence of immunity to each of the three diseases. A routine second dose of MMR vaccine should be obtained at least 28 days after the first dose for students attending postsecondary schools, health care workers, or international travelers. People who received inactivated measles vaccine or an unknown type of measles vaccine during 1963-1967 should receive 2 doses of MMR vaccine. People who received inactivated mumps vaccine or an unknown type of mumps vaccine before 1979 and are at high risk for mumps infection should consider immunization with 2 doses of MMR vaccine. For females of childbearing age, rubella immunity should be determined. If there is no evidence of immunity, females who are not pregnant should be vaccinated. If there is no evidence of immunity, females who are pregnant should delay immunization until after pregnancy. Unvaccinated health care workers born before 1957 who lack laboratory evidence of measles, mumps, or rubella immunity or laboratory confirmation of disease should consider measles and mumps immunization with 2 doses of MMR vaccine or rubella immunization with 1 dose of MMR vaccine.  Pneumococcal 13-valent conjugate (PCV13) vaccine. When indicated, a person who is uncertain of his immunization history and has no record of immunization should receive the PCV13 vaccine. All adults 65 years of age and older should receive this  vaccine. An adult aged 19 years or older who has certain medical conditions and has not been previously immunized should receive 1 dose of PCV13 vaccine. This PCV13 should be followed with a dose of pneumococcal polysaccharide (PPSV23) vaccine. Adults who are at high risk for pneumococcal disease should obtain the PPSV23 vaccine at least 8 weeks after the dose of PCV13 vaccine. Adults older than 59 years of age who have normal immune system function should obtain the PPSV23 vaccine dose at least 1 year after the dose of PCV13 vaccine.  Pneumococcal polysaccharide (PPSV23) vaccine. When PCV13 is also indicated, PCV13 should be obtained first. All adults aged 65 years and older should be immunized. An adult younger than age 65 years who has certain medical conditions should be immunized. Any person who resides in a nursing home or long-term care facility should be immunized. An adult smoker should be immunized. People with an immunocompromised condition and certain other conditions should receive both PCV13 and PPSV23 vaccines. People with human immunodeficiency virus (HIV) infection should be immunized as soon as possible after diagnosis. Immunization during chemotherapy or radiation therapy should be avoided. Routine use of PPSV23 vaccine is not recommended for American Indians, [place] Natives, or people younger than 65 years unless there are medical conditions that require PPSV23 vaccine. When indicated, people who have unknown immunization and have no record of immunization should receive PPSV23 vaccine. One-time revaccination 5 years after the first dose of PPSV23 is recommended for people aged 19-64 years who have chronic kidney failure, nephrotic syndrome, asplenia, or immunocompromised conditions. People who received 1-2 doses of PPSV23 before age 65 years should receive another dose of PPSV23 vaccine at age 65 years or later if at least 5 years have passed since the previous dose. Doses of PPSV23 are not  needed for people immunized with PPSV23 at or after age 65 years.  Meningococcal vaccine. Adults with asplenia or persistent complement component deficiencies should receive 2 doses of quadrivalent meningococcal conjugate (MenACWY-D) vaccine. The doses should be obtained   at least 2 months apart. Microbiologists working with certain meningococcal bacteria, [place] recruits, people at risk during an outbreak, and people who travel to or live in countries with a high rate of meningitis should be immunized. A first-year college student up through age 34 years who is living in a residence hall should receive a dose if she did not receive a dose on or after her 16th birthday. Adults who have certain high-risk conditions should receive one or more doses of vaccine.  Hepatitis A vaccine. Adults who wish to be protected from this disease, have certain high-risk conditions, work with hepatitis A-infected animals, work in hepatitis A research labs, or travel to or work in countries with a high rate of hepatitis A should be immunized. Adults who were previously unvaccinated and who anticipate close contact with an international adoptee during the first 60 days after arrival in the [place] from a country with a high rate of hepatitis A should be immunized.  Hepatitis B vaccine. Adults who wish to be protected from this disease, have certain high-risk conditions, may be exposed to blood or other infectious body fluids, are household contacts or sex partners of hepatitis B positive people, are clients or workers in certain care facilities, or travel to or work in countries with a high rate of hepatitis B should be immunized.  Haemophilus influenzae type b (Hib) vaccine. A previously unvaccinated person with asplenia or sickle cell disease or having a scheduled splenectomy should receive 1 dose of Hib vaccine. Regardless of previous immunization, a recipient of a hematopoietic stem cell transplant should receive a  3-dose series 6-12 months after her successful transplant. Hib vaccine is not recommended for adults with HIV infection. Preventive Services / Frequency Ages 35 to 4 years  Blood pressure check.** / Every 3-5 years.  Lipid and cholesterol check.** / Every 5 years beginning at age 60.  Clinical breast exam.** / Every 3 years for women in their 71s and 10s.  BRCA-related cancer risk assessment.** / For women who have family members with a BRCA-related cancer (breast, ovarian, tubal, or peritoneal cancers).  Pap test.** / Every 2 years from ages 76 through 26. Every 3 years starting at age 61 through age 76 or 93 with a history of 3 consecutive normal Pap tests.  HPV screening.** / Every 3 years from ages 37 through ages 60 to 51 with a history of 3 consecutive normal Pap tests.  Hepatitis C blood test.** / For any individual with known risks for hepatitis C.  Skin self-exam. / Monthly.  Influenza vaccine. / Every year.  Tetanus, diphtheria, and acellular pertussis (Tdap, Td) vaccine.** / Consult your health care provider. Pregnant women should receive 1 dose of Tdap vaccine during each pregnancy. 1 dose of Td every 10 years.  Varicella vaccine.** / Consult your health care provider. Pregnant females who do not have evidence of immunity should receive the first dose after pregnancy.  HPV vaccine. / 3 doses over 6 months, if 93 and younger. The vaccine is not recommended for use in pregnant females. However, pregnancy testing is not needed before receiving a dose.  Measles, mumps, rubella (MMR) vaccine.** / You need at least 1 dose of MMR if you were born in 1957 or later. You may also need a 2nd dose. For females of childbearing age, rubella immunity should be determined. If there is no evidence of immunity, females who are not pregnant should be vaccinated. If there is no evidence of immunity, females who are  pregnant should delay immunization until after pregnancy.  Pneumococcal  13-valent conjugate (PCV13) vaccine.** / Consult your health care provider.  Pneumococcal polysaccharide (PPSV23) vaccine.** / 1 to 2 doses if you smoke cigarettes or if you have certain conditions.  Meningococcal vaccine.** / 1 dose if you are age 68 to 8 years and a Market researcher living in a residence hall, or have one of several medical conditions, you need to get vaccinated against meningococcal disease. You may also need additional booster doses.  Hepatitis A vaccine.** / Consult your health care provider.  Hepatitis B vaccine.** / Consult your health care provider.  Haemophilus influenzae type b (Hib) vaccine.** / Consult your health care provider. Ages 7 to 53 years  Blood pressure check.** / Every year.  Lipid and cholesterol check.** / Every 5 years beginning at age 25 years.  Lung cancer screening. / Every year if you are aged 11-80 years and have a 30-pack-year history of smoking and currently smoke or have quit within the past 15 years. Yearly screening is stopped once you have quit smoking for at least 15 years or develop a health problem that would prevent you from having lung cancer treatment.  Clinical breast exam.** / Every year after age 48 years.  BRCA-related cancer risk assessment.** / For women who have family members with a BRCA-related cancer (breast, ovarian, tubal, or peritoneal cancers).  Mammogram.** / Every year beginning at age 41 years and continuing for as long as you are in good health. Consult with your health care provider.  Pap test.** / Every 3 years starting at age 65 years through age 37 or 70 years with a history of 3 consecutive normal Pap tests.  HPV screening.** / Every 3 years from ages 72 years through ages 60 to 40 years with a history of 3 consecutive normal Pap tests.  Fecal occult blood test (FOBT) of stool. / Every year beginning at age 21 years and continuing until age 5 years. You may not need to do this test if you get  a colonoscopy every 10 years.  Flexible sigmoidoscopy or colonoscopy.** / Every 5 years for a flexible sigmoidoscopy or every 10 years for a colonoscopy beginning at age 35 years and continuing until age 48 years.  Hepatitis C blood test.** / For all people born from [birth year] through 1965 and any individual with known risks for hepatitis C.  Skin self-exam. / Monthly.  Influenza vaccine. / Every year.  Tetanus, diphtheria, and acellular pertussis (Tdap/Td) vaccine.** / Consult your health care provider. Pregnant women should receive 1 dose of Tdap vaccine during each pregnancy. 1 dose of Td every 10 years.  Varicella vaccine.** / Consult your health care provider. Pregnant females who do not have evidence of immunity should receive the first dose after pregnancy.  Zoster vaccine.** / 1 dose for adults aged 30 years or older.  Measles, mumps, rubella (MMR) vaccine.** / You need at least 1 dose of MMR if you were born in 1957 or later. You may also need a second dose. For females of childbearing age, rubella immunity should be determined. If there is no evidence of immunity, females who are not pregnant should be vaccinated. If there is no evidence of immunity, females who are pregnant should delay immunization until after pregnancy.  Pneumococcal 13-valent conjugate (PCV13) vaccine.** / Consult your health care provider.  Pneumococcal polysaccharide (PPSV23) vaccine.** / 1 to 2 doses if you smoke cigarettes or if you have certain conditions.  Meningococcal vaccine.** /  Consult your health care provider.  Hepatitis A vaccine.** / Consult your health care provider.  Hepatitis B vaccine.** / Consult your health care provider.  Haemophilus influenzae type b (Hib) vaccine.** / Consult your health care provider. Ages 64 years and over  Blood pressure check.** / Every year.  Lipid and cholesterol check.** / Every 5 years beginning at age 23 years.  Lung cancer screening. / Every year if you  are aged 16-80 years and have a 30-pack-year history of smoking and currently smoke or have quit within the past 15 years. Yearly screening is stopped once you have quit smoking for at least 15 years or develop a health problem that would prevent you from having lung cancer treatment.  Clinical breast exam.** / Every year after age 74 years.  BRCA-related cancer risk assessment.** / For women who have family members with a BRCA-related cancer (breast, ovarian, tubal, or peritoneal cancers).  Mammogram.** / Every year beginning at age 44 years and continuing for as long as you are in good health. Consult with your health care provider.  Pap test.** / Every 3 years starting at age 58 years through age 22 or 39 years with 3 consecutive normal Pap tests. Testing can be stopped between 65 and 70 years with 3 consecutive normal Pap tests and no abnormal Pap or HPV tests in the past 10 years.  HPV screening.** / Every 3 years from ages 64 years through ages 70 or 61 years with a history of 3 consecutive normal Pap tests. Testing can be stopped between 65 and 70 years with 3 consecutive normal Pap tests and no abnormal Pap or HPV tests in the past 10 years.  Fecal occult blood test (FOBT) of stool. / Every year beginning at age 40 years and continuing until age 27 years. You may not need to do this test if you get a colonoscopy every 10 years.  Flexible sigmoidoscopy or colonoscopy.** / Every 5 years for a flexible sigmoidoscopy or every 10 years for a colonoscopy beginning at age 7 years and continuing until age 32 years.  Hepatitis C blood test.** / For all people born from [birth year] through 1964 and any individual with known risks for hepatitis C.  Osteoporosis screening.** / A one-time screening for women ages 30 years and over and women at risk for fractures or osteoporosis.  Skin self-exam. / Monthly.  Influenza vaccine. / Every year.  Tetanus, diphtheria, and acellular pertussis (Tdap/Td)  vaccine.** / 1 dose of Td every 10 years.  Varicella vaccine.** / Consult your health care provider.  Zoster vaccine.** / 1 dose for adults aged 35 years or older.  Pneumococcal 13-valent conjugate (PCV13) vaccine.** / Consult your health care provider.  Pneumococcal polysaccharide (PPSV23) vaccine.** / 1 dose for all adults aged 46 years and older.  Meningococcal vaccine.** / Consult your health care provider.  Hepatitis A vaccine.** / Consult your health care provider.  Hepatitis B vaccine.** / Consult your health care provider.  Haemophilus influenzae type b (Hib) vaccine.** / Consult your health care provider. ** Family history and personal history of risk and conditions may change your health care provider's recommendations.   This information is not intended to replace advice given to you by your health care provider. Make sure you discuss any questions you have with your health care provider.   Document Released: 09/23/2001 Document Revised: 08/18/2014 Document Reviewed: 12/23/2010 Elsevier Interactive Patient Education [employer].

## 2016-03-21 ENCOUNTER — Encounter: Payer: Self-pay | Admitting: Family Medicine

## 2016-03-21 ENCOUNTER — Telehealth: Payer: Self-pay | Admitting: Family Medicine

## 2016-03-21 NOTE — Telephone Encounter (Signed)
°  Relationship to patient: Self  Can be reached:[phone]  Reason for call: Patient states she was suppose to get Tetnus at her last appointment but forgot. Can she get it on Tuesday when she comes for labs?

## 2016-03-21 NOTE — Telephone Encounter (Signed)
Yes--- sorry -- I forgot

## 2016-03-21 NOTE — Telephone Encounter (Signed)
Please advise      KP 

## 2016-03-21 NOTE — Telephone Encounter (Signed)
Please schedule.    KP 

## 2016-03-24 NOTE — Telephone Encounter (Signed)
Left message for patient to call and schedule Nurse Visit.

## 2016-03-25 ENCOUNTER — Other Ambulatory Visit (INDEPENDENT_AMBULATORY_CARE_PROVIDER_SITE_OTHER): Payer: BLUE CROSS/BLUE SHIELD

## 2016-03-25 DIAGNOSIS — Z0000 Encounter for general adult medical examination without abnormal findings: Secondary | ICD-10-CM

## 2016-03-25 DIAGNOSIS — Z1159 Encounter for screening for other viral diseases: Secondary | ICD-10-CM

## 2016-03-25 DIAGNOSIS — R82998 Other abnormal findings in urine: Secondary | ICD-10-CM

## 2016-03-25 DIAGNOSIS — N390 Urinary tract infection, site not specified: Secondary | ICD-10-CM | POA: Diagnosis not present

## 2016-03-25 LAB — POCT URINALYSIS DIPSTICK
Bilirubin, UA: NEGATIVE
Blood, UA: NEGATIVE
Glucose, UA: NEGATIVE
Ketones, UA: NEGATIVE
Nitrite, UA: NEGATIVE
PROTEIN UA: NEGATIVE
Spec Grav, UA: 1.025
UROBILINOGEN UA: 0.2
pH, UA: 6

## 2016-03-25 LAB — COMPREHENSIVE METABOLIC PANEL
ALBUMIN: 4.1 g/dL (ref 3.5–5.2)
ALT: 17 U/L (ref 0–35)
AST: 19 U/L (ref 0–37)
Alkaline Phosphatase: 68 U/L (ref 39–117)
BUN: 16 mg/dL (ref 6–23)
CALCIUM: 9.1 mg/dL (ref 8.4–10.5)
CHLORIDE: 106 meq/L (ref 96–112)
CO2: 29 mEq/L (ref 19–32)
Creatinine, Ser: 0.8 mg/dL (ref 0.40–1.20)
GFR: 77.91 mL/min (ref >60.00)
Glucose, Bld: 99 mg/dL (ref 70–99)
POTASSIUM: 3.8 meq/L (ref 3.5–5.1)
SODIUM: 141 meq/L (ref 135–145)
TOTAL PROTEIN: 7 g/dL (ref 6.0–8.3)
Total Bilirubin: 0.5 mg/dL (ref 0.2–1.2)

## 2016-03-25 LAB — LIPID PANEL
CHOLESTEROL: 173 mg/dL (ref 0–200)
HDL: 83.2 mg/dL (ref >39.00)
LDL CALC: 83 mg/dL (ref 0–99)
NonHDL: 90.12
TRIGLYCERIDES: 37 mg/dL (ref 0.0–149.0)
Total CHOL/HDL Ratio: 2
VLDL: 7.4 mg/dL (ref 0.0–40.0)

## 2016-03-25 LAB — CBC WITH DIFFERENTIAL/PLATELET
BASOS ABS: 0 10*3/uL (ref 0.0–0.1)
Basophils Relative: 0.5 % (ref 0.0–3.0)
EOS ABS: 0.1 10*3/uL (ref 0.0–0.7)
EOS PCT: 2.2 % (ref 0.0–5.0)
HCT: 38.9 % (ref 36.0–46.0)
HEMOGLOBIN: 13.3 g/dL (ref 12.0–15.0)
LYMPHS ABS: 1.3 10*3/uL (ref 0.7–4.0)
Lymphocytes Relative: 39.2 % (ref 12.0–46.0)
MCHC: 34.3 g/dL (ref 30.0–36.0)
MCV: 93.4 fl (ref 78.0–100.0)
MONO ABS: 0.3 10*3/uL (ref 0.1–1.0)
Monocytes Relative: 9.1 % (ref 3.0–12.0)
NEUTROS PCT: 49 % (ref 43.0–77.0)
Neutro Abs: 1.7 10*3/uL (ref 1.4–7.7)
Platelets: 265 10*3/uL (ref 150.0–400.0)
RBC: 4.17 Mil/uL (ref 3.87–5.11)
RDW: 13.1 % (ref 11.5–15.5)
WBC: 3.4 10*3/uL — AB (ref 4.0–10.5)

## 2016-03-25 LAB — TSH: TSH: 1.8 u[IU]/mL (ref 0.35–4.50)

## 2016-03-25 NOTE — Addendum Note (Signed)
Addended by: Harley AltoPRICE, KRISTY M on: 03/25/2016 02:28 PM   Modules accepted: Orders

## 2016-03-26 LAB — URINE CULTURE

## 2016-03-26 LAB — HEPATITIS C ANTIBODY: HCV AB: NEGATIVE

## 2016-04-03 NOTE — Progress Notes (Signed)
Tawana ScaleZach Plumer Mittelstaedt D.O. [facility] [address] [place], [place] [address] Phone: [phone] Subjective:    I'm seeing this patient by the request  of:  Donato SchultzYvonne R Lowne Chase, DO    CC: Right arm pain   [id number]:Subjective  Pamela LimJoyce K [name] is a 60 y.o. female coming in with complaint of Right arm pain. Been going on 6 months. Describes the pain as a dull, throbbing aching sensation. Patient states that started when she was sleeping and awoke her about at night. Was unable to move her shoulder significantly for a couple weeks. Slowly got better. Unfortunately continues to have pain with certain movements. Does not remember any true injury. Now certain things such as eating or sitting in one position for a long amount of time seems to get discomfort. Patient denies any swelling, denies any weakness. Patient rates the severity of pain as 5 out of 10. Seems to be more constant.     Past Medical History:  Diagnosis Date  . Allergic rhinitis   . Allergy    Past Surgical History:  Procedure Laterality Date  . BREAST BIOPSY  11-2000   benign  . OOPHORECTOMY    . VAGINAL HYSTERECTOMY     Social History   Social History  . Marital status: Married    Spouse name: N/A  . Number of children: 1  . Years of education: N/A   Occupational History  . investment banking    Social History Main Topics  . Smoking status: Never Smoker  . Smokeless tobacco: Never Used  . Alcohol use 0.0 oz/week     Comment: rare  . Drug use: No  . Sexual activity: Yes    Partners: Male   Other Topics Concern  . Not on file   Social History Narrative   Exercise--no   Allergies  Allergen Reactions  . Sulfonamide Derivatives Swelling  . Penicillins Rash   Family History  Problem Relation Age of Onset  . Breast cancer Mother   . Hypertension Mother   . Stroke Mother   . Pancreatitis Father     Past medical history, social, surgical and family history all reviewed in electronic medical record.   No pertanent information unless stated regarding to the chief complaint.   Review of Systems: No headache, visual changes, nausea, vomiting, diarrhea, constipation, dizziness, abdominal pain, skin rash, fevers, chills, night sweats, weight loss, swollen lymph nodes, body aches, joint swelling, muscle aches, chest pain, shortness of breath, mood changes.   Objective  There were no vitals taken for this visit.  General: No apparent distress alert and oriented x3 mood and affect normal, dressed appropriately.  HEENT: Pupils equal, extraocular movements intact  Respiratory: Patient's speak in full sentences and does not appear short of breath  Cardiovascular: No lower extremity edema, non tender, no erythema  Skin: Warm dry intact with no signs of infection or rash on extremities or on axial skeleton.  Abdomen: Soft nontender  Neuro: Cranial nerves II through XII are intact, neurovascularly intact in all extremities with 2+ DTRs and 2+ pulses.  Lymph: No lymphadenopathy of posterior or anterior cervical chain or axillae bilaterally.  Gait normal with good balance and coordination.  MSK:  Non tender with full range of motion and good stability and symmetric strength and tone of  elbows, wrist, hip, knee and ankles bilaterally.  Shoulder: Right Inspection reveals no abnormalities, atrophy or asymmetry. Pain over the right acromioclavicular joint. Rotator cuff strength normal throughout. signs of impingement  with positive Neer and Hawkin's tests, but negative empty can sign. Speeds and Yergason's tests normal. No labral pathology noted with negative Obrien's, negative clunk and good stability. Normal scapular function observed. No painful arc and no drop arm sign. No apprehension sign Contralateral shoulder unremarkable  MSK US performed of: Right This study was ordered, performed, and interpreted by Terrilee FilesZach Keylani Perlstein D.O.  Shoulder:   Supraspinatus:  Appears normal on long and transverse views,  Bursal bulge seen with shoulder abduction on impingement view. Calcific changes of bursa noted Infraspinatus:  Appears normal on long and transverse views. Significant increase in Doppler flow Subscapularis:  Appears normal on long and transverse views. Positive bursa with calcific changes Teres Minor:  Appears normal on long and transverse views. AC joint: Positive geyser sign with significant capsular distention. Glenohumeral Joint:  Appears normal without effusion. Glenoid Labrum:  Intact without visualized tears. Biceps Tendon:  Appears normal on long and transverse views, no fraying of tendon, tendon located in intertubercular groove, no subluxation with shoulder internal or external rotation.  Impression: Calcific Subacromial bursitis and capsulitis of the acromial clavicular joint.  Procedure: Real-time Ultrasound Guided Injection of right acromial clavicular joint Device: GE Logiq E  Ultrasound guided injection is preferred based studies that show increased duration, increased effect, greater accuracy, decreased procedural pain, increased response rate with ultrasound guided versus blind injection.  Verbal informed consent obtained.  Time-out conducted.  Noted no overlying erythema, induration, or other signs of local infection.  Skin prepped in a sterile fashion.  Local anesthesia: Topical Ethyl chloride.  With sterile technique and under real time ultrasound guidance:  Joint visualized. With a 25-gauge half-inch needle patient was injected with a total of 0.5 mL of 0.5% Marcaine and 0.5 mL of Kenalog 40 mg/dL. Patient tolerated procedure well. Completed without difficulty  Pain immediately resolved suggesting accurate placement of the medication.  Advised to call if fevers/chills, erythema, induration, drainage, or persistent bleeding.  Images permanently stored and available for review in the ultrasound unit.  Impression: Technically successful ultrasound guided injection.       Impression and Recommendations:     This case required medical decision making of moderate complexity.      Note: This dictation was prepared with Dragon dictation along with smaller phrase technology. Any transcriptional errors that result from this process are unintentional.

## 2016-04-04 ENCOUNTER — Ambulatory Visit (INDEPENDENT_AMBULATORY_CARE_PROVIDER_SITE_OTHER): Payer: BLUE CROSS/BLUE SHIELD | Admitting: Family Medicine

## 2016-04-04 ENCOUNTER — Other Ambulatory Visit: Payer: Self-pay

## 2016-04-04 ENCOUNTER — Encounter: Payer: Self-pay | Admitting: Family Medicine

## 2016-04-04 VITALS — BP 110/72 | HR 84 | Ht 64.0 in | Wt 172.0 lb

## 2016-04-04 DIAGNOSIS — M6520 Calcific tendinitis, unspecified site: Secondary | ICD-10-CM | POA: Diagnosis not present

## 2016-04-04 DIAGNOSIS — M25519 Pain in unspecified shoulder: Secondary | ICD-10-CM | POA: Insufficient documentation

## 2016-04-04 DIAGNOSIS — M25511 Pain in right shoulder: Secondary | ICD-10-CM

## 2016-04-04 DIAGNOSIS — M79601 Pain in right arm: Secondary | ICD-10-CM

## 2016-04-04 DIAGNOSIS — M7530 Calcific tendinitis of unspecified shoulder: Secondary | ICD-10-CM

## 2016-04-04 MED ORDER — DICLOFENAC SODIUM 2 % TD SOLN: 2.0000 "application " | Freq: Two times a day (BID) | TRANSDERMAL | 3 refills | Status: DC

## 2016-04-04 MED ORDER — VITAMIN D (ERGOCALCIFEROL) 1.25 MG (50000 UNIT) PO CAPS: 50000.0000 [IU] | ORAL_CAPSULE | ORAL | 0 refills | Status: DC

## 2016-04-04 NOTE — Patient Instructions (Addendum)
Good to see you  Ice 20 minutes 2 times daily. Usually after activity and before bed. Exercises 3 times a week.  pennsaid pinkie amount topically 2 times daily as needed.  Once weekly vitamin D for 12 weeks.  See me again in 3-4 week sto make sure you are doing better.

## 2016-04-04 NOTE — Assessment & Plan Note (Signed)
Patient did have a synovitis or capsulitis of the before meals joint. Patient did do well with the injection. We discussed icing regimen and home exercises. We discussed topical anti-inflammatories are prescribed. Once weekly vitamin D for the calcific bursitis seen in the shoulder. Patient and will come back and see me again in 3-4 weeks for further evaluation and treatment.

## 2016-05-01 NOTE — Progress Notes (Signed)
Tawana ScaleZach Saydee Zolman D.O. [facility] [address] [place], [place] [address] Phone: [phone] Subjective:    I'm seeing this patient by the request  of:  Donato SchultzYvonne R Lowne Chase, DO    CC: Right arm pain Follow-up  BJY:[id number]:Subjective  Jeanice LimJoyce K Faas is a 60 y.o. female coming in with complaint of Right arm pain. Patient was found to have more of acromial clavicular arthritis. Patient was given an injection one month ago. Patient also had calcific bursitis of the shoulder. Patient was to do home exercises and icing pedicle. Patient states 80% improvement since he injection. Still having some pain that seems to radiate down the arm. Describes it as a dull, throbbing aching pain. Patient has been doing the exercises fairly regularly.      Past Medical History:  Diagnosis Date  . Allergic rhinitis   . Allergy    Past Surgical History:  Procedure Laterality Date  . BREAST BIOPSY  11-2000   benign  . OOPHORECTOMY    . VAGINAL HYSTERECTOMY     Social History   Social History  . Marital status: Married    Spouse name: N/A  . Number of children: 1  . Years of education: N/A   Occupational History  . investment banking    Social History Main Topics  . Smoking status: Never Smoker  . Smokeless tobacco: Never Used  . Alcohol use 0.0 oz/week     Comment: rare  . Drug use: No  . Sexual activity: Yes    Partners: Male   Other Topics Concern  . Not on file   Social History Narrative   Exercise--no   Allergies  Allergen Reactions  . Sulfonamide Derivatives Swelling  . Penicillins Rash   Family History  Problem Relation Age of Onset  . Breast cancer Mother   . Hypertension Mother   . Stroke Mother   . Pancreatitis Father     Past medical history, social, surgical and family history all reviewed in electronic medical record.  No pertanent information unless stated regarding to the chief complaint.   Review of Systems: No headache, visual changes, nausea, vomiting,  diarrhea, constipation, dizziness, abdominal pain, skin rash, fevers, chills, night sweats, weight loss, swollen lymph nodes, body aches, joint swelling, muscle aches, chest pain, shortness of breath, mood changes.   Objective  There were no vitals taken for this visit.  General: No apparent distress alert and oriented x3 mood and affect normal, dressed appropriately.  HEENT: Pupils equal, extraocular movements intact  Respiratory: Patient's speak in full sentences and does not appear short of breath  Cardiovascular: No lower extremity edema, non tender, no erythema  Skin: Warm dry intact with no signs of infection or rash on extremities or on axial skeleton.  Abdomen: Soft nontender  Neuro: Cranial nerves II through XII are intact, neurovascularly intact in all extremities with 2+ DTRs and 2+ pulses.  Lymph: No lymphadenopathy of posterior or anterior cervical chain or axillae bilaterally.  Gait normal with good balance and coordination.  MSK:  Non tender with full range of motion and good stability and symmetric strength and tone of  elbows, wrist, hip, knee and ankles bilaterally.  Shoulder: Right Inspection reveals no abnormalities, atrophy or asymmetry. Nontender on exam Rotator cuff strength normal throughout. Impingement sign still remaining Speeds and Yergason's tests normal. No labral pathology noted with negative Obrien's, negative clunk and good stability. Normal scapular function observed. No painful arc and no drop arm sign. No apprehension sign  Contralateral shoulder unremarkable  Procedure: Real-time Ultrasound Guided Injection of right glenohumeral joint Device: GE Logiq E  Ultrasound guided injection is preferred based studies that show increased duration, increased effect, greater accuracy, decreased procedural pain, increased response rate with ultrasound guided versus blind injection.  Verbal informed consent obtained.  Time-out conducted.  Noted no overlying  erythema, induration, or other signs of local infection.  Skin prepped in a sterile fashion.  Local anesthesia: Topical Ethyl chloride.  With sterile technique and under real time ultrasound guidance:  Joint visualized.  23g 1  inch needle inserted posterior approach. Pictures taken for needle placement. Patient did have injection of 2 cc of 1% lidocaine, 2 cc of 0.5% Marcaine, and 1.0 cc of Kenalog 40 mg/dL. Completed without difficulty  Pain immediately resolved suggesting accurate placement of the medication.  Advised to call if fevers/chills, erythema, induration, drainage, or persistent bleeding.  Images permanently stored and available for review in the ultrasound unit.  Impression: Technically successful ultrasound guided injection.     Impression and Recommendations:     This case required medical decision making of moderate complexity.      Note: This dictation was prepared with Dragon dictation along with smaller phrase technology. Any transcriptional errors that result from this process are unintentional.

## 2016-05-02 ENCOUNTER — Ambulatory Visit (INDEPENDENT_AMBULATORY_CARE_PROVIDER_SITE_OTHER): Payer: BLUE CROSS/BLUE SHIELD | Admitting: Family Medicine

## 2016-05-02 ENCOUNTER — Other Ambulatory Visit: Payer: Self-pay

## 2016-05-02 ENCOUNTER — Encounter: Payer: Self-pay | Admitting: Family Medicine

## 2016-05-02 VITALS — BP 116/78 | HR 76 | Wt 175.0 lb

## 2016-05-02 DIAGNOSIS — M6520 Calcific tendinitis, unspecified site: Secondary | ICD-10-CM | POA: Diagnosis not present

## 2016-05-02 DIAGNOSIS — M7530 Calcific tendinitis of unspecified shoulder: Secondary | ICD-10-CM

## 2016-05-02 DIAGNOSIS — M25511 Pain in right shoulder: Secondary | ICD-10-CM | POA: Diagnosis not present

## 2016-05-02 NOTE — Patient Instructions (Signed)
Good to see you.  Ice 20 minutes 2 times daily. Usually after activity and before bed. Exercises 3 times a week.  [facility] therapy will be calling you  Injected the shouder and should take care of the rest of the pain .  Ice is still your friend See me again in 4-6 weeks one more time to make sure you are doing well.

## 2016-05-02 NOTE — Assessment & Plan Note (Signed)
Resolved after injection 

## 2016-05-02 NOTE — Assessment & Plan Note (Signed)
Patient given injection today. Tolerated the procedure well. Patient work with Event organiserathletic trainer and will be sent to formal physical therapy. Continue the once weekly vitamin D. We discussed icing regimen. Patient will come back and see me again in 4-6 weeks.

## 2016-05-08 ENCOUNTER — Encounter: Payer: Self-pay | Admitting: Physical Therapy

## 2016-05-08 ENCOUNTER — Ambulatory Visit: Payer: BLUE CROSS/BLUE SHIELD | Attending: Family Medicine | Admitting: Physical Therapy

## 2016-05-08 DIAGNOSIS — M25511 Pain in right shoulder: Secondary | ICD-10-CM | POA: Diagnosis not present

## 2016-05-08 DIAGNOSIS — M6281 Muscle weakness (generalized): Secondary | ICD-10-CM | POA: Diagnosis not present

## 2016-05-08 NOTE — Therapy (Signed)
[facility] [address] [place], [place], [address] Phone: [phone]   Fax:  [phone]  Physical Therapy Evaluation  Patient Details  Name: [name] MRN: [id number] Date of Birth: Apr 16, 1956 Referring Provider: Antoine Primas DO  Encounter Date: 05/08/2016      PT End of Session - 05/08/16 0935    Visit Number 1   Number of Visits 13   Date for PT Re-Evaluation 08/07/16   Authorization Type BCBS   PT Start Time 0847   PT Stop Time 0934   PT Time Calculation (min) 47 min   Activity Tolerance Patient tolerated treatment well   Behavior During Therapy [facility] for tasks assessed/performed      Past Medical History:  Diagnosis Date  . Allergic rhinitis   . Allergy     Past Surgical History:  Procedure Laterality Date  . BREAST BIOPSY  11-2000   benign  . OOPHORECTOMY    . VAGINAL HYSTERECTOMY      There were no vitals filed for this visit.       Subjective Assessment - 05/08/16 0850    Subjective pt is a 60 y.o with CC of R shoulder that started about 6 months ago that started insidously. Feels like laid on it wrong and had pain after sleeping. pt reports getting 2 injection in the shoulder with some improvement. pain stays mostly in the shoulder exept for occassional referral to the mid upper arm, some pain with reaching to the side. pt declined N/T into the arm.    Limitations Lifting;House hold activities;Other (comment)  writing, cutting, repetive motions   Diagnostic tests [place] at the clinic    Patient Stated Goals learn how to exercises right, To be pain free,    Currently in Pain? Yes   Pain Score 7    Pain Location Shoulder   Pain Orientation Right   Pain Descriptors / Indicators Throbbing;Aching;Sore   Pain Type Chronic pain   Pain Radiating Towards to the mid upper humerus    Pain Onset More than a month ago   Pain Frequency Constant   Aggravating Factors  reaching behind, using the arm with  repetitive motions, abduction    Pain Relieving Factors ice, heat             [facility] PT Assessment - 05/08/16 0839      Assessment   Medical Diagnosis R shoulder pain   Referring Provider Antoine Primas DO   Onset Date/Surgical Date --  6 months    Hand Dominance Right   Next MD Visit --  4 weeks   Prior Therapy no     Precautions   Precautions None     Restrictions   Weight Bearing Restrictions No     Balance Screen   Has the patient fallen in the past 6 months No   Has the patient had a decrease in activity level because of a fear of falling?  No   Is the patient reluctant to leave their home because of a fear of falling?  No     Home Environment   Living Environment Private residence   Living Arrangements Spouse/significant other   Available Help at Discharge Available PRN/intermittently   Type of Home House   Home Access Stairs to enter   Entrance Stairs-Rails None   Home Layout Two level   Alternate Level Stairs-Number of Steps 12   Alternate Level Stairs-Rails Right     Prior Function   Level  of Independence Independent;Independent with basic ADLs   Vocation Requirements prolonged sitting, standing, typing, writing  excutive assistant    Leisure reading, walking,      Cognition   Overall Cognitive Status Within Functional Limits for tasks assessed     Observation/Other Assessments   Focus on Therapeutic Outcomes (FOTO)  34% limited  predicted 28% limited     Posture/Postural Control   Posture/Postural Control Postural limitations   Postural Limitations Rounded Shoulders;Forward head     ROM / Strength   AROM / PROM / Strength AROM;PROM;Strength     AROM   AROM Assessment Site Shoulder   Right/Left Shoulder Right;Left   Right Shoulder Extension 58 Degrees  ERP   Right Shoulder Flexion 154 Degrees   Right Shoulder ABduction 120 Degrees  ERP   Right Shoulder Internal Rotation 52 Degrees  ERP   Right Shoulder External Rotation 86 Degrees   Left  Shoulder Extension 68 Degrees   Left Shoulder Flexion 160 Degrees   Left Shoulder ABduction 121 Degrees   Left Shoulder Internal Rotation 68 Degrees   Left Shoulder External Rotation 88 Degrees     Strength   Strength Assessment Site Shoulder;Hand   Right/Left Shoulder Right;Left   Right Shoulder Flexion 4+/5   Right Shoulder Extension 4-/5  pain during testing   Right Shoulder ABduction 4+/5   Right Shoulder Internal Rotation 4/5  pain during testing   Right Shoulder External Rotation 4/5  pain during testing   Left Shoulder Flexion 4+/5   Left Shoulder Extension 5/5   Left Shoulder ABduction 4+/5   Left Shoulder Internal Rotation 4+/5   Left Shoulder External Rotation 4+/5   Right Hand Grip (lbs) 45.3  45,46,45   Left Hand Grip (lbs) 43.3  41,45,44     Palpation   Palpation comment tendnerness at the supraspinatus and the teres minor      Special Tests    Special Tests Rotator Cuff Impingement   Rotator Cuff Impingment tests Painful Arc of Motion;Full Can test;Empty Can test;Hawkins- Kennedy test;Neer impingement test;other     Hawkins-Kennedy test   Findings Positive   Side Right   Comments nuetral and horizontal adduction     Empty Can test   Findings Positive   Side Right   Comment pain in the posterior aspect of the shoulder     Full Can test   Findings Positive   Side Right   Comment pain in the top of the shoulder at the supraspinatus     Painful Arc of Motion   Findings Positive   Side Right   Comments abduction     other   Findings Positive  scapular assist test   Side Right   Comments reduced pain with abduction/ flexion                           PT Education - 05/08/16 0934    Education provided Yes   Education Details evaluation findings, POC, goals, HEP with proper form and treatment rationale, anatomy of the shoulder and mechancis of the humerus/ scapula using skeleton for visual cues   Person(s) Educated Patient    Methods Explanation;Demonstration;Verbal cues;Handout   Comprehension Verbalized understanding;Returned demonstration;Verbal cues required          PT Short Term Goals - 05/08/16 1339      PT SHORT TERM GOAL #1   Title pt will be I with inital HEP (05/29/2016)   Time 3  Period Weeks   Status New     PT SHORT TERM GOAL #2   Title pt will be able to verbalize and demo techniques to reduce pain and inflammation via RICE and HEP (05/29/2016)   Time 3   Period Weeks   Status New     PT SHORT TERM GOAL #3   Title pt will increase R shoulder extension/ internal rotation by >/= 10 degress with </= 4/10 pain for functional ADLS (05/29/2016)   Time 3   Period Weeks   Status New           PT Long Term Goals - 05/08/16 1340      PT LONG TERM GOAL #1   Title pt will be I with all HEP given as of last visit (08/07/2016)   Time 6   Period Weeks   Status New     PT LONG TERM GOAL #2   Title pt will increase R shoulder strength to >/= 4+/5 in all planes with </= 2/10 pain to for lifting and carrying activities (08/07/2016)   Time 6   Period Weeks   Status New     PT LONG TERM GOAL #3   Title pt will be able to lift and lower >/= 8# from above head shelf and push / pull >/= 15# with </= 2/10 pain for functional lifting and carrying ADLS and work related tasks (08/07/2016)   Time 6   Period Weeks   Status New     PT LONG TERM GOAL #4   Title she will increase her FOTO score to </= 28% limited to demonstrate improvement in function at discharge (08/07/2016)   Time 6   Period Weeks   Status New     PT LONG TERM GOAL #5   Title she will report </= 2/10  pain with prolong hand writing activities as well as reaching into the back seat while driving (65/78/469612/28/2017)   Time 6   Period Weeks   Status New               Plan - 05/08/16 0935    Clinical Impression Statement Mrs. Pettey presents to OPPT as a low complexity evaluation with CC of chronic R shoulder pain. she  demonstrates functional R shoulder AROM in all planes with pain noted at end ranges, mild limtation in strength in the R shoulder compared bil secondary to pain. tenderness noted at the sub-acromial region, teres minor, and supraspinatus. Special testing is postive for probable impingement pathology. She would benefit from physical therapy to decrase pain, improve strength and endurance and return pt to PLOF by addressing the deficits listed.    Rehab Potential Good   PT Frequency 2x / week   PT Duration 6 weeks   PT Treatment/Interventions ADLs/Self Care Home Management;Electrical Stimulation;Iontophoresis 4mg /ml Dexamethasone;Functional mobility training;Moist Heat;Ultrasound;Therapeutic activities;Therapeutic exercise;Patient/family education;Taping;Dry needling;Manual techniques;Passive range of motion;Cryotherapy   PT Next Visit Plan assess/ review HEP, scapular stability ther ex, manual soft tissue work, modalities HEP   PT Home Exercise Plan scapular retractions. upper trap stretch, levator scapulae stretch, Ceiling punches   Consulted and Agree with Plan of Care Patient      Patient will benefit from skilled therapeutic intervention in order to improve the following deficits and impairments:  Abnormal gait, Decreased activity tolerance, Pain, Improper body mechanics, Postural dysfunction, Decreased strength, Increased fascial restricitons, Decreased endurance, Decreased range of motion  Visit Diagnosis: Pain in right shoulder - Plan: PT plan of care cert/re-cert  Muscle  weakness (generalized) - Plan: PT plan of care cert/re-cert     Problem List Patient Active Problem List   Diagnosis Date Noted  . AC joint pain 04/04/2016  . Calcific bursitis of shoulder 04/04/2016  . Right arm pain 11/22/2015  . Sinusitis, acute maxillary 08/02/2014  . THYROIDITIS 03/14/2010  . ABNORMAL THYROID FUNCTION TESTS 02/27/2010  . NUMBNESS, HAND 02/12/2010  . EDEMA 02/12/2010  . VAGINAL BLEEDING,  ABNORMAL 12/14/2008  . IRREGULAR MENSES 12/04/2008  . ALLERGIC RHINITIS 06/13/2008  . UTI 02/18/2008  . SINUSITIS- ACUTE-NOS 06/25/2007  . PHARYNGITIS, ACUTE 02/16/2007  . FOREIGN BODY, FOOT/TOE W/O INFECTION 02/16/2007   Lulu Riding PT, DPT, LAT, ATC  05/08/16  1:52 PM      [facility] [address] [place], [place], [address] Phone: [phone]   Fax:  [phone]  Name: [name] MRN: [id number] Date of Birth: August 13, 1955

## 2016-05-13 ENCOUNTER — Ambulatory Visit: Payer: BLUE CROSS/BLUE SHIELD | Attending: Family Medicine | Admitting: Physical Therapy

## 2016-05-13 DIAGNOSIS — M25511 Pain in right shoulder: Secondary | ICD-10-CM | POA: Diagnosis not present

## 2016-05-13 DIAGNOSIS — M6281 Muscle weakness (generalized): Secondary | ICD-10-CM | POA: Insufficient documentation

## 2016-05-13 NOTE — Therapy (Signed)
[facility] [address] [place], [place], [address] Phone: [phone]   Fax:  [phone]  Physical Therapy Treatment  Patient Details  Name: [name] MRN: [id number] Date of Birth: 08/15/1955 Referring Provider: Antoine Primas DO  Encounter Date: 05/13/2016      PT End of Session - 05/13/16 1249    Visit Number 2   Number of Visits 13   Date for PT Re-Evaluation 08/07/16   PT Start Time 1100   PT Stop Time 1143   PT Time Calculation (min) 43 min   Activity Tolerance Patient tolerated treatment well   Behavior During Therapy [facility] for tasks assessed/performed      Past Medical History:  Diagnosis Date  . Allergic rhinitis   . Allergy     Past Surgical History:  Procedure Laterality Date  . BREAST BIOPSY  11-2000   benign  . OOPHORECTOMY    . VAGINAL HYSTERECTOMY      There were no vitals filed for this visit.      Subjective Assessment - 05/13/16 1102    Subjective "things are going good, exercise are going good and being consistent"    Currently in Pain? No/denies                         [facility] Adult PT Treatment/Exercise - 05/13/16 0001      Shoulder Exercises: Supine   Other Supine Exercises ceiling punches 2 x 10  1 set no weight, 1 set 3#   Other Supine Exercises chin tucks 1 x 10 with 5 sec hold     Shoulder Exercises: Seated   Retraction AROM;Strengthening;Both;10 reps  2 sets     Shoulder Exercises: Standing   External Rotation Strengthening;Right;10 reps;Theraband   Theraband Level (Shoulder External Rotation) Level 2 (Red)   Internal Rotation Strengthening;Right;10 reps;Theraband   Theraband Level (Shoulder Internal Rotation) Level 2 (Red)   Extension Strengthening;Both;10 reps;Theraband   Theraband Level (Shoulder Extension) Level 2 (Red)  2 sets   Row Strengthening;Both;10 reps;Theraband  x 2 sets   Theraband Level (Shoulder Row) Level 2 (Red)   Other Standing  Exercises spidermans against wall   red theraband 2 x 10     Shoulder Exercises: ROM/Strengthening   UBE (Upper Arm Bike) L 1 x 6 min  changing direction at 3 min     Neck Exercises: Stretches   Upper Trapezius Stretch 2 reps;30 seconds   Levator Stretch 2 reps;30 seconds                PT Education - 05/13/16 1248    Education provided Yes   Education Details reviewed home HEP, required cues on stretching on holding for 30 sec not 10 sec    Person(s) Educated Patient   Methods Explanation;Verbal cues   Comprehension Verbalized understanding;Verbal cues required          PT Short Term Goals - 05/08/16 1339      PT SHORT TERM GOAL #1   Title pt will be I with inital HEP (05/29/2016)   Time 3   Period Weeks   Status New     PT SHORT TERM GOAL #2   Title pt will be able to verbalize and demo techniques to reduce pain and inflammation via RICE and HEP (05/29/2016)   Time 3   Period Weeks   Status New     PT SHORT TERM GOAL #3   Title pt will increase  R shoulder extension/ internal rotation by >/= 10 degress with </= 4/10 pain for functional ADLS (05/29/2016)   Time 3   Period Weeks   Status New           PT Long Term Goals - 05/08/16 1340      PT LONG TERM GOAL #1   Title pt will be I with all HEP given as of last visit (08/07/2016)   Time 6   Period Weeks   Status New     PT LONG TERM GOAL #2   Title pt will increase R shoulder strength to >/= 4+/5 in all planes with </= 2/10 pain to for lifting and carrying activities (08/07/2016)   Time 6   Period Weeks   Status New     PT LONG TERM GOAL #3   Title pt will be able to lift and lower >/= 8# from above head shelf and push / pull >/= 15# with </= 2/10 pain for functional lifting and carrying ADLS and work related tasks (08/07/2016)   Time 6   Period Weeks   Status New     PT LONG TERM GOAL #4   Title she will increase her FOTO score to </= 28% limited to demonstrate improvement in function at  discharge (08/07/2016)   Time 6   Period Weeks   Status New     PT LONG TERM GOAL #5   Title she will report </= 2/10  pain with prolong hand writing activities as well as reaching into the back seat while driving (16/10/960412/28/2017)   Time 6   Period Weeks   Status New               Plan - 05/13/16 1249    Clinical Impression Statement [name] reports she has been consistent with her HEp and reports no pain statically and states she has only 1/10 pain reaching back. reviewed HEP and worked on scapular stabilizers and rotator cuff strengthening, which she performed well.    PT Next Visit Plan scapular stability ther ex, manual soft tissue work, modalities HEP, if tolerated well last session add rotator cuff and rows to HEP   Consulted and Agree with Plan of Care Patient      Patient will benefit from skilled therapeutic intervention in order to improve the following deficits and impairments:  Abnormal gait, Decreased activity tolerance, Pain, Improper body mechanics, Postural dysfunction, Decreased strength, Increased fascial restricitons, Decreased endurance, Decreased range of motion  Visit Diagnosis: Right shoulder pain, unspecified chronicity  Muscle weakness (generalized)     Problem List Patient Active Problem List   Diagnosis Date Noted  . AC joint pain 04/04/2016  . Calcific bursitis of shoulder 04/04/2016  . Right arm pain 11/22/2015  . Sinusitis, acute maxillary 08/02/2014  . THYROIDITIS 03/14/2010  . ABNORMAL THYROID FUNCTION TESTS 02/27/2010  . NUMBNESS, HAND 02/12/2010  . EDEMA 02/12/2010  . VAGINAL BLEEDING, ABNORMAL 12/14/2008  . IRREGULAR MENSES 12/04/2008  . ALLERGIC RHINITIS 06/13/2008  . UTI 02/18/2008  . SINUSITIS- ACUTE-NOS 06/25/2007  . PHARYNGITIS, ACUTE 02/16/2007  . FOREIGN BODY, FOOT/TOE W/O INFECTION 02/16/2007   Lulu RidingKristoffer Whalen Trompeter PT, DPT, LAT, ATC  05/13/16  12:51 PM      [facility] [address] [place], [place], [address] Phone: [phone]   Fax:  [phone]  Name: [name] MRN: [id number] Date of Birth: 24-Sep-1955

## 2016-05-16 ENCOUNTER — Ambulatory Visit: Payer: BLUE CROSS/BLUE SHIELD | Admitting: Physical Therapy

## 2016-05-20 ENCOUNTER — Ambulatory Visit: Payer: BLUE CROSS/BLUE SHIELD | Admitting: Physical Therapy

## 2016-05-20 DIAGNOSIS — M6281 Muscle weakness (generalized): Secondary | ICD-10-CM

## 2016-05-20 DIAGNOSIS — M25511 Pain in right shoulder: Secondary | ICD-10-CM

## 2016-05-20 NOTE — Patient Instructions (Signed)

## 2016-05-20 NOTE — Therapy (Signed)
[name], [place], [address] Phone: [phone]   Fax:  [phone]  Physical Therapy Treatment  Patient Details  Name: [name] MRN: [id number] Date of Birth: 07/28/1956 Referring Provider: Hulan Saas DO  Encounter Date: 05/20/2016      PT End of Session - 05/20/16 1455    Visit Number 3   Number of Visits 13   Date for PT Re-Evaluation 08/07/16   PT Start Time 0848   PT Stop Time 0945   PT Time Calculation (min) 57 min   Activity Tolerance Patient tolerated treatment well   Behavior During Therapy [facility] for tasks assessed/performed      Past Medical History:  Diagnosis Date  . Allergic rhinitis   . Allergy     Past Surgical History:  Procedure Laterality Date  . BREAST BIOPSY  11-2000   benign  . OOPHORECTOMY    . VAGINAL HYSTERECTOMY      There were no vitals filed for this visit.      Subjective Assessment - 05/20/16 0850    Subjective No pain.  Doing the exercises.  Going to work after session.  Understands RICE.  Dressing with less pain, reaching with less pain.  No radiating pain.   Currently in Pain? No/denies   Pain Score --  mild and brief   Pain Orientation Right   Pain Descriptors / Indicators Sore   Pain Frequency Intermittent   Aggravating Factors  reaching.  reaching behind   Pain Relieving Factors wait for it to go away, I keep moving.                          [facility] Adult PT Treatment/Exercise - 05/20/16 0001      Self-Care   Self-Care ADL's;Posture   ADL's Handout issues   Posture work station discussed in detail with suggestions for modifications.      Lumbar Exercises: Quadruped   [place] --  2 reps to find neutral   Single Arm Raise 10 reps   Straight Leg Raise 10 reps   Opposite Arm/Leg Raise 10 reps   Opposite Arm/Leg Raise Limitations wrist pain post, did not notify me during     Shoulder Exercises: Supine   Other  Supine Exercises Supine scapular stabilization series 10 X each green band     Shoulder Exercises: Standing   External Rotation Strengthening;Right;10 reps;Theraband   Theraband Level (Shoulder External Rotation) Level 2 (Red)   Theraband Level (Shoulder Internal Rotation) Level 3 (Green)   Flexion Limitations Punch, red band 10 X  HEP    Row Strengthening   Theraband Level (Shoulder Row) Level 3 (Green)   Other Standing Exercises [place] 4 exercise series, 10 X each using towel roll For rotations 10 X each <HE     Shoulder Exercises: ROM/Strengthening   UBE (Upper Arm Bike) L1 6 minutes 3 minutes each way     Modalities   Modalities Cryotherapy     Cryotherapy   Number Minutes Cryotherapy 10 Minutes   Cryotherapy Location Shoulder;Wrist   Type of Cryotherapy --  cold pack                PT Education - 05/20/16 1455    Education provided Yes   Education Details [place],  Posture/ADL   Person(s) Educated Patient   Methods Explanation;Demonstration;Tactile cues;Verbal cues;Handout   Comprehension Verbalized understanding;Returned demonstration  PT Short Term Goals - 05/20/16 1458      PT SHORT TERM GOAL #1   Title pt will be I with inital HEP (05/29/2016)   Baseline minor cues   Time 3   Period Weeks   Status On-going     PT SHORT TERM GOAL #2   Title pt will be able to verbalize and demo techniques to reduce pain and inflammation via RICE and HEP (05/29/2016)   Baseline understands   Time 3   Period Weeks   Status On-going     PT SHORT TERM GOAL #3   Title pt will increase R shoulder extension/ internal rotation by >/= 10 degress with </= 4/10 pain for functional ADLS (86/76/1950)   Time 3   Period Weeks   Status Unable to assess           PT Long Term Goals - 05/08/16 1340      PT LONG TERM GOAL #1   Title pt will be I with all HEP given as of last visit (08/07/2016)   Time 6   Period Weeks   Status New     PT LONG TERM GOAL #2    Title pt will increase R shoulder strength to >/= 4+/5 in all planes with </= 2/10 pain to for lifting and carrying activities (08/07/2016)   Time 6   Period Weeks   Status New     PT LONG TERM GOAL #3   Title pt will be able to lift and lower >/= 8# from above head shelf and push / pull >/= 15# with </= 2/10 pain for functional lifting and carrying ADLS and work related tasks (08/07/2016)   Time 6   Period Weeks   Status New     PT LONG TERM GOAL #4   Title she will increase her FOTO score to </= 28% limited to demonstrate improvement in function at discharge (08/07/2016)   Time 6   Period Weeks   Status New     PT LONG TERM GOAL #5   Title she will report </= 2/10  pain with prolong hand writing activities as well as reaching into the back seat while driving (93/26/7124)   Time 6   Period Weeks   Status New               Plan - 05/20/16 1456    Clinical Impression Statement Able to progress HEP.Marland Kitchen RICE goal met. Pain has already improved.  If she makes modifications to work station her pain should continue to improve.   Able to demonstrate neutral posture with cues.    PT Next Visit Plan review rockwood, answer any ADL ?, GYM strengthening?   PT Home Exercise Plan [place]   Consulted and Agree with Plan of Care Patient      Patient will benefit from skilled therapeutic intervention in order to improve the following deficits and impairments:  Abnormal gait, Decreased activity tolerance, Pain, Improper body mechanics, Postural dysfunction, Decreased strength, Increased fascial restricitons, Decreased endurance, Decreased range of motion  Visit Diagnosis: Right shoulder pain, unspecified chronicity  Muscle weakness (generalized)     Problem List Patient Active Problem List   Diagnosis Date Noted  . AC joint pain 04/04/2016  . Calcific bursitis of shoulder 04/04/2016  . Right arm pain 11/22/2015  . Sinusitis, acute maxillary 08/02/2014  . THYROIDITIS  03/14/2010  . ABNORMAL THYROID FUNCTION TESTS 02/27/2010  . NUMBNESS, HAND 02/12/2010  . EDEMA 02/12/2010  . VAGINAL BLEEDING,  ABNORMAL 12/14/2008  . IRREGULAR MENSES 12/04/2008  . ALLERGIC RHINITIS 06/13/2008  . UTI 02/18/2008  . SINUSITIS- ACUTE-NOS 06/25/2007  . PHARYNGITIS, ACUTE 02/16/2007  . FOREIGN BODY, FOOT/TOE W/O INFECTION 02/16/2007    HARRIS,KAREN PTA 05/20/2016, 3:00 PM  [facility] [address] [place], [place], [address] Phone: [phone]   Fax:  [phone]  Name: ERAINA WINNIE MRN: [id number] Date of Birth: 01-09-1956

## 2016-05-22 ENCOUNTER — Ambulatory Visit: Payer: BLUE CROSS/BLUE SHIELD | Admitting: Physical Therapy

## 2016-05-27 ENCOUNTER — Ambulatory Visit: Payer: BLUE CROSS/BLUE SHIELD | Admitting: Physical Therapy

## 2016-05-27 DIAGNOSIS — M25511 Pain in right shoulder: Secondary | ICD-10-CM

## 2016-05-27 DIAGNOSIS — M6281 Muscle weakness (generalized): Secondary | ICD-10-CM

## 2016-05-27 NOTE — Therapy (Signed)
[facility] [address] [place], [place], [address] Phone: [phone]   Fax:  [phone]  Physical Therapy Treatment  Patient Details  Name: [name] MRN: [id number] Date of Birth: 05/10/1956 Referring Provider: Antoine Primas DO  Encounter Date: 05/27/2016      PT End of Session - 05/27/16 0853    Visit Number 4   Number of Visits 13   Date for PT Re-Evaluation 08/07/16   PT Start Time 0801   PT Stop Time 0845   PT Time Calculation (min) 44 min   Activity Tolerance Patient tolerated treatment well   Behavior During Therapy [facility] for tasks assessed/performed      Past Medical History:  Diagnosis Date  . Allergic rhinitis   . Allergy     Past Surgical History:  Procedure Laterality Date  . BREAST BIOPSY  11-2000   benign  . OOPHORECTOMY    . VAGINAL HYSTERECTOMY      There were no vitals filed for this visit.      Subjective Assessment - 05/27/16 0807    Subjective "only issue I have is some pain with sleeping on that side, but overall I am doing much better"   Currently in Pain? No/denies   Pain Score 0-No pain                         [facility] Adult PT Treatment/Exercise - 05/27/16 0809      Shoulder Exercises: Prone   Other Prone Exercises serratus press on elbows in plank position 2 x 10  (modified to elbows due to wrist issues)   Other Prone Exercises I's, T's, Y's 2 x 10  1#     Shoulder Exercises: Standing   Extension Strengthening;Both;Theraband;12 reps   Theraband Level (Shoulder Extension) Level 4 (Blue)   Row Strengthening   Theraband Level (Shoulder Row) Level 4 (Blue)   Other Standing Exercises PNF D2 with red band in stand 2 x 10     Shoulder Exercises: ROM/Strengthening   UBE (Upper Arm Bike) L1.5 x 8 min  changing direction at 4 min     Shoulder Exercises: Body Blade   Flexion 15 seconds  x 4 with bicep curl/ tricep press   Other Body Blade Exercises IR/ER 4 x  15 sec     Neck Exercises: Stretches   Upper Trapezius Stretch 2 reps;30 seconds  2 x 30 sec   Levator Stretch 2 reps;30 seconds   Other Neck Stretches Rhomboid stretch 2 x 30 sec                PT Education - 05/27/16 0853    Education provided Yes   Education Details updated HEP for prone scapular stabilizers   Person(s) Educated Patient   Methods Explanation;Verbal cues;Handout   Comprehension Verbalized understanding;Verbal cues required          PT Short Term Goals - 05/20/16 1458      PT SHORT TERM GOAL #1   Title pt will be I with inital HEP (05/29/2016)   Baseline minor cues   Time 3   Period Weeks   Status On-going     PT SHORT TERM GOAL #2   Title pt will be able to verbalize and demo techniques to reduce pain and inflammation via RICE and HEP (05/29/2016)   Baseline understands   Time 3   Period Weeks   Status On-going     PT SHORT TERM  GOAL #3   Title pt will increase R shoulder extension/ internal rotation by >/= 10 degress with </= 4/10 pain for functional ADLS (05/29/2016)   Time 3   Period Weeks   Status Unable to assess           PT Long Term Goals - 05/08/16 1340      PT LONG TERM GOAL #1   Title pt will be I with all HEP given as of last visit (08/07/2016)   Time 6   Period Weeks   Status New     PT LONG TERM GOAL #2   Title pt will increase R shoulder strength to >/= 4+/5 in all planes with </= 2/10 pain to for lifting and carrying activities (08/07/2016)   Time 6   Period Weeks   Status New     PT LONG TERM GOAL #3   Title pt will be able to lift and lower >/= 8# from above head shelf and push / pull >/= 15# with </= 2/10 pain for functional lifting and carrying ADLS and work related tasks (08/07/2016)   Time 6   Period Weeks   Status New     PT LONG TERM GOAL #4   Title she will increase her FOTO score to </= 28% limited to demonstrate improvement in function at discharge (08/07/2016)   Time 6   Period Weeks    Status New     PT LONG TERM GOAL #5   Title she will report </= 2/10  pain with prolong hand writing activities as well as reaching into the back seat while driving (40/98/119112/28/2017)   Time 6   Period Weeks   Status New               Plan - 05/27/16 0853    Clinical Impression Statement pt continues to make progress with physical therapy with only report of pain when laying on the hsoulder. continued scapular stabilzer strengthening progressed resistance, she reported no pain post session.    PT Next Visit Plan review rockwood, answer any ADL ?, GYM strengthening? progress HEP PRN.    Consulted and Agree with Plan of Care Patient      Patient will benefit from skilled therapeutic intervention in order to improve the following deficits and impairments:  Abnormal gait, Decreased activity tolerance, Pain, Improper body mechanics, Postural dysfunction, Decreased strength, Increased fascial restricitons, Decreased endurance, Decreased range of motion  Visit Diagnosis: Right shoulder pain, unspecified chronicity  Muscle weakness (generalized)     Problem List Patient Active Problem List   Diagnosis Date Noted  . AC joint pain 04/04/2016  . Calcific bursitis of shoulder 04/04/2016  . Right arm pain 11/22/2015  . Sinusitis, acute maxillary 08/02/2014  . THYROIDITIS 03/14/2010  . ABNORMAL THYROID FUNCTION TESTS 02/27/2010  . NUMBNESS, HAND 02/12/2010  . EDEMA 02/12/2010  . VAGINAL BLEEDING, ABNORMAL 12/14/2008  . IRREGULAR MENSES 12/04/2008  . ALLERGIC RHINITIS 06/13/2008  . UTI 02/18/2008  . SINUSITIS- ACUTE-NOS 06/25/2007  . PHARYNGITIS, ACUTE 02/16/2007  . FOREIGN BODY, FOOT/TOE W/O INFECTION 02/16/2007   [name] PT, DPT, LAT, ATC  05/27/16  9:09 AM      [facility] [address] [place], [place], [address] Phone: [phone]   Fax:  [phone]  Name: [name] MRN: [id number] Date of Birth:  06/05/1956

## 2016-05-28 NOTE — Progress Notes (Signed)
[facility] [address] [place], [place] [address] Phone: [phone] Subjective:    I'm seeing this patient by the request  of:  Donato Schultz, DO    CC: Right arm pain Follow-up  [id number]  [name] is a 60 y.o. female coming in with complaint of Right arm pain. Patient was found to have more of acromial clavicular arthritis. Given injection 04/04/2016. Was doing better but was still having a calcific bursitis of the shoulder. Patient's glenohumeral joint was injected in 05/02/2016. Sent to formal physical therapy. Patient was to continue all other conservative therapy. Patient states 100% better     Past Medical History:  Diagnosis Date  . Allergic rhinitis   . Allergy    Past Surgical History:  Procedure Laterality Date  . BREAST BIOPSY  11-2000   benign  . OOPHORECTOMY    . VAGINAL HYSTERECTOMY     Social History   Social History  . Marital status: Married    Spouse name: N/A  . Number of children: 1  . Years of education: N/A   Occupational History  . investment banking    Social History Main Topics  . Smoking status: Never Smoker  . Smokeless tobacco: Never Used  . Alcohol use 0.0 oz/week     Comment: rare  . Drug use: No  . Sexual activity: Yes    Partners: Male   Other Topics Concern  . None   Social History Narrative   Exercise--no   Allergies  Allergen Reactions  . Sulfonamide Derivatives Swelling  . Penicillins Rash   Family History  Problem Relation Age of Onset  . Breast cancer Mother   . Hypertension Mother   . Stroke Mother   . Pancreatitis Father     Past medical history, social, surgical and family history all reviewed in electronic medical record.  No pertanent information unless stated regarding to the chief complaint.   Review of Systems: No headache, visual changes, nausea, vomiting, diarrhea, constipation, dizziness, abdominal pain, skin rash, fevers, chills, night sweats,  weight loss, swollen lymph nodes, body aches, joint swelling, muscle aches, chest pain, shortness of breath, mood changes.   Objective  Blood pressure 114/64, pulse 76, weight 178 lb (80.7 kg), SpO2 98 %.  General: No apparent distress alert and oriented x3 mood and affect normal, dressed appropriately.  HEENT: Pupils equal, extraocular movements intact  Respiratory: Patient's speak in full sentences and does not appear short of breath  Cardiovascular: No lower extremity edema, non tender, no erythema  Skin: Warm dry intact with no signs of infection or rash on extremities or on axial skeleton.  Abdomen: Soft nontender  Neuro: Cranial nerves II through XII are intact, neurovascularly intact in all extremities with 2+ DTRs and 2+ pulses.  Lymph: No lymphadenopathy of posterior or anterior cervical chain or axillae bilaterally.  Gait normal with good balance and coordination.  MSK:  Non tender with full range of motion and good stability and symmetric strength and tone of  elbows, wrist, hip, knee and ankles bilaterally.  Shoulder: Right Inspection reveals no abnormalities, atrophy or asymmetry. Nontender on exam Rotator cuff strength normal throughout. No impingement signs Speeds and Yergason's tests normal. No labral pathology noted with negative Obrien's, negative clunk and good stability. Normal scapular function observed. No painful arc and no drop arm sign. No apprehension sign Contralateral shoulder unremarkable     Impression and Recommendations:     This case required medical  decision making of moderate complexity.      Note: This dictation was prepared with Dragon dictation along with smaller phrase technology. Any transcriptional errors that result from this process are unintentional.

## 2016-05-29 ENCOUNTER — Encounter: Payer: Self-pay | Admitting: Family Medicine

## 2016-05-29 ENCOUNTER — Ambulatory Visit (INDEPENDENT_AMBULATORY_CARE_PROVIDER_SITE_OTHER): Payer: BLUE CROSS/BLUE SHIELD | Admitting: Family Medicine

## 2016-05-29 DIAGNOSIS — M25511 Pain in right shoulder: Secondary | ICD-10-CM | POA: Diagnosis not present

## 2016-05-29 DIAGNOSIS — M7530 Calcific tendinitis of unspecified shoulder: Secondary | ICD-10-CM | POA: Diagnosis not present

## 2016-05-29 NOTE — Assessment & Plan Note (Signed)
Completely resolved, encouraged exercise 1-2 times a week. Follow-up as needed

## 2016-05-29 NOTE — Patient Instructions (Addendum)
Great to see you  Ice will always be your friend.  See me when you need me.

## 2016-05-29 NOTE — Assessment & Plan Note (Signed)
Doing well. If ever increasing pain again on an injection. Doing well with conservative therapy.

## 2016-05-30 ENCOUNTER — Ambulatory Visit: Payer: BLUE CROSS/BLUE SHIELD | Admitting: Physical Therapy

## 2016-06-03 ENCOUNTER — Ambulatory Visit: Payer: BLUE CROSS/BLUE SHIELD | Admitting: Physical Therapy

## 2016-06-03 DIAGNOSIS — M25511 Pain in right shoulder: Secondary | ICD-10-CM

## 2016-06-03 DIAGNOSIS — M6281 Muscle weakness (generalized): Secondary | ICD-10-CM | POA: Diagnosis not present

## 2016-06-03 NOTE — Therapy (Signed)
[facility] [place], [place], [address] Phone: [phone]   Fax:  [phone]  Physical Therapy Treatment  Patient Details  Name: [name] MRN: [id number] Date of Birth: Jan 24, 1956 Referring Provider: Hulan Saas DO  Encounter Date: 06/03/2016      PT End of Session - 06/03/16 0831    Visit Number 5   Number of Visits 13   Date for PT Re-Evaluation 08/07/16   PT Start Time 0801  shortened visit due to discharge   PT Stop Time 0831   PT Time Calculation (min) 30 min   Activity Tolerance Patient tolerated treatment well   Behavior During Therapy [facility] for tasks assessed/performed      Past Medical History:  Diagnosis Date  . Allergic rhinitis   . Allergy     Past Surgical History:  Procedure Laterality Date  . BREAST BIOPSY  11-2000   benign  . OOPHORECTOMY    . VAGINAL HYSTERECTOMY      There were no vitals filed for this visit.      Subjective Assessment - 06/03/16 0807    Subjective "I am doing pretty well, I do have intermittent days where it feels more tight but occurs mostly with sitting"   Currently in Pain? No/denies            [facility] PT Assessment - 06/03/16 0001      Observation/Other Assessments   Focus on Therapeutic Outcomes (FOTO)  27% limited     AROM   Right Shoulder Extension 59 Degrees  feels tight at end range   Right Shoulder Flexion 161 Degrees   Right Shoulder ABduction 149 Degrees   Right Shoulder Internal Rotation 60 Degrees  test in 90/90    Right Shoulder External Rotation 90 Degrees  test in 90/90      Strength   Right Shoulder Flexion 4+/5   Right Shoulder Extension 5/5   Right Shoulder ABduction 5/5   Right Shoulder Internal Rotation 4+/5   Right Shoulder External Rotation 4+/5   Right Hand Grip (lbs) 54  53,53,56                             PT Education - 06/03/16 0833    Education provided Yes   Education Details  reviewed all previously given HEP, discussed continued progression with increasing reps/ sets to continue challenging strength/ endurance. conintue working on posture using visual reminders such as post it note on desk to promote better positioning, as well as setting timer for occasionall short term breaks.    Person(s) Educated Patient   Methods Explanation;Verbal cues   Comprehension Verbalized understanding;Verbal cues required          PT Short Term Goals - 06/03/16 0819      PT SHORT TERM GOAL #1   Title pt will be I with inital HEP (05/29/2016)   Time 3   Period Weeks   Status Achieved     PT SHORT TERM GOAL #2   Title pt will be able to verbalize and demo techniques to reduce pain and inflammation via RICE and HEP (05/29/2016)   Time 3   Period Weeks   Status Achieved     PT SHORT TERM GOAL #3   Title pt will increase R shoulder extension/ internal rotation by >/= 10 degress with </= 4/10 pain for functional ADLS (43/88/8757)   Time 3   Period Weeks  Status Partially Met           PT Long Term Goals - 06/03/16 0820      PT LONG TERM GOAL #1   Title pt will be I with all HEP given as of last visit (08/07/2016)   Time 6   Period Weeks   Status Achieved     PT LONG TERM GOAL #2   Title pt will increase R shoulder strength to >/= 4+/5 in all planes with </= 2/10 pain to for lifting and carrying activities (08/07/2016)   Time 6   Period Weeks   Status Achieved     PT LONG TERM GOAL #3   Title pt will be able to lift and lower >/= 8# from above head shelf and push / pull >/= 15# with </= 2/10 pain for functional lifting and carrying ADLS and work related tasks (08/07/2016)   Time 6   Period Weeks   Status Achieved     PT LONG TERM GOAL #4   Title she will increase her FOTO score to </= 28% limited to demonstrate improvement in function at discharge (08/07/2016)   Time 6   Period Weeks   Status Achieved     PT LONG TERM GOAL #5   Title she will report  </= 2/10  pain with prolong hand writing activities as well as reaching into the back seat while driving (35/32/9924)   Time 6   Period Weeks   Status Achieved               Plan - 06/03/16 2683    Clinical Impression Statement [name] has made great progress with physical therapy reporting no pain today and improved shoulder AROM and strength. she met all goals today and reports only intermittent soreness/ tightness with prolonged sitting that is controlled with exercises. she is able to maintain and progress her current level of function independently and will be discharged from PT today.    PT Next Visit Plan Discharge   PT Home Exercise Plan Reviewed HEP and provided upgraded resistance band   Consulted and Agree with Plan of Care Patient      Patient will benefit from skilled therapeutic intervention in order to improve the following deficits and impairments:  Abnormal gait, Decreased activity tolerance, Pain, Improper body mechanics, Postural dysfunction, Decreased strength, Increased fascial restricitons, Decreased endurance, Decreased range of motion  Visit Diagnosis: Right shoulder pain, unspecified chronicity  Muscle weakness (generalized)     Problem List Patient Active Problem List   Diagnosis Date Noted  . AC joint pain 04/04/2016  . Calcific bursitis of shoulder 04/04/2016  . Right arm pain 11/22/2015  . Sinusitis, acute maxillary 08/02/2014  . THYROIDITIS 03/14/2010  . ABNORMAL THYROID FUNCTION TESTS 02/27/2010  . NUMBNESS, HAND 02/12/2010  . EDEMA 02/12/2010  . VAGINAL BLEEDING, ABNORMAL 12/14/2008  . IRREGULAR MENSES 12/04/2008  . ALLERGIC RHINITIS 06/13/2008  . UTI 02/18/2008  . SINUSITIS- ACUTE-NOS 06/25/2007  . PHARYNGITIS, ACUTE 02/16/2007  . FOREIGN BODY, FOOT/TOE W/O INFECTION 02/16/2007   Starr Lake PT, DPT, LAT, ATC  06/03/16  8:36 AM      [facility] [address] [place], [place], [address] Phone: [phone]   Fax:  [phone]  Name: [name] MRN: [id number] Date of Birth: 06/29/1956   PHYSICAL THERAPY DISCHARGE SUMMARY  Visits from Start of Care: 5  Current functional level related to goals / functional outcomes: FOTO 27% limited   Remaining  deficits: Intermittent tightness in the R upper trap with prolonged sitting, pt reported it being relieved with stretching and exercise as well as changing position.    Education / Equipment: HEP, posture education, theraband for strengthening,   Plan: Patient agrees to discharge.  Patient goals were partially met. Patient is being discharged due to meeting the stated rehab goals.  ?????

## 2016-06-05 ENCOUNTER — Ambulatory Visit: Payer: BLUE CROSS/BLUE SHIELD | Admitting: Physical Therapy

## 2016-07-08 ENCOUNTER — Other Ambulatory Visit: Payer: Self-pay | Admitting: Family Medicine

## 2016-09-16 DIAGNOSIS — H524 Presbyopia: Secondary | ICD-10-CM | POA: Diagnosis not present

## 2016-09-16 DIAGNOSIS — H52223 Regular astigmatism, bilateral: Secondary | ICD-10-CM | POA: Diagnosis not present

## 2016-09-16 DIAGNOSIS — H5213 Myopia, bilateral: Secondary | ICD-10-CM | POA: Diagnosis not present

## 2016-12-30 DIAGNOSIS — Z01419 Encounter for gynecological examination (general) (routine) without abnormal findings: Secondary | ICD-10-CM | POA: Diagnosis not present

## 2016-12-30 DIAGNOSIS — Z1231 Encounter for screening mammogram for malignant neoplasm of breast: Secondary | ICD-10-CM | POA: Diagnosis not present

## 2016-12-30 DIAGNOSIS — Z6828 Body mass index (BMI) 28.0-28.9, adult: Secondary | ICD-10-CM | POA: Diagnosis not present

## 2017-01-29 ENCOUNTER — Encounter: Payer: Self-pay | Admitting: Family Medicine

## 2017-01-29 ENCOUNTER — Ambulatory Visit (INDEPENDENT_AMBULATORY_CARE_PROVIDER_SITE_OTHER): Payer: BLUE CROSS/BLUE SHIELD | Admitting: Family Medicine

## 2017-01-29 ENCOUNTER — Ambulatory Visit: Payer: Self-pay

## 2017-01-29 VITALS — BP 116/70 | HR 78 | Wt 168.0 lb

## 2017-01-29 DIAGNOSIS — M25511 Pain in right shoulder: Secondary | ICD-10-CM

## 2017-01-29 DIAGNOSIS — G8929 Other chronic pain: Secondary | ICD-10-CM | POA: Diagnosis not present

## 2017-01-29 DIAGNOSIS — M7530 Calcific tendinitis of unspecified shoulder: Secondary | ICD-10-CM | POA: Diagnosis not present

## 2017-01-29 NOTE — Assessment & Plan Note (Signed)
Patient had a repeat injection today. Tolerated the procedure well. We discussed icing regimen and home exercises. We discussed which activities to do a which ones to avoid. Patient will do topical anti-inflammatories. Worsening symptoms we'll consider further evaluation and treatment possibly formal physical therapy.

## 2017-01-29 NOTE — Progress Notes (Signed)
[facility] [address] [place], [place] [address] Phone: [phone] Subjective:    I'm seeing this patient by the request  of:    CC: Right shoulder and arm pain  [id number]  [name] is a 61 y.o. female coming in with complaint of right shoulder pain. Patient has been seen previously for more of a acromioclavicular arthritis as well as a calcific bursitis. Patient's last injection was September 2017. Patient states Worsening pain at this time. Seems in the shoulder.      Past Medical History:  Diagnosis Date  . Allergic rhinitis   . Allergy    Past Surgical History:  Procedure Laterality Date  . BREAST BIOPSY  11-2000   benign  . OOPHORECTOMY    . VAGINAL HYSTERECTOMY     Social History   Social History  . Marital status: Married    Spouse name: N/A  . Number of children: 1  . Years of education: N/A   Occupational History  . investment banking    Social History Main Topics  . Smoking status: Never Smoker  . Smokeless tobacco: Never Used  . Alcohol use 0.0 oz/week     Comment: rare  . Drug use: No  . Sexual activity: Yes    Partners: Male   Other Topics Concern  . None   Social History Narrative   Exercise--no   Allergies  Allergen Reactions  . Sulfonamide Derivatives Swelling  . Penicillins Rash   Family History  Problem Relation Age of Onset  . Breast cancer Mother   . Hypertension Mother   . Stroke Mother   . Pancreatitis Father     Past medical history, social, surgical and family history all reviewed in electronic medical record.  No pertanent information unless stated regarding to the chief complaint.   Review of Systems:Review of systems updated and as accurate as of 01/29/17  No headache, visual changes, nausea, vomiting, diarrhea, constipation, dizziness, abdominal pain, skin rash, fevers, chills, night sweats, weight loss, swollen lymph nodes, body aches, joint swelling, muscle aches, chest pain,  shortness of breath, mood changes.   Objective  Blood pressure 116/70, pulse 78, weight 168 lb (76.2 kg), SpO2 97 %. Systems examined below as of 01/29/17   General: No apparent distress alert and oriented x3 mood and affect normal, dressed appropriately.  HEENT: Pupils equal, extraocular movements intact  Respiratory: Patient's speak in full sentences and does not appear short of breath  Cardiovascular: No lower extremity edema, non tender, no erythema  Skin: Warm dry intact with no signs of infection or rash on extremities or on axial skeleton.  Abdomen: Soft nontender  Neuro: Cranial nerves II through XII are intact, neurovascularly intact in all extremities with 2+ DTRs and 2+ pulses.  Lymph: No lymphadenopathy of posterior or anterior cervical chain or axillae bilaterally.  Gait normal with good balance and coordination.  MSK:  Non tender with full range of motion and good stability and symmetric strength and tone of  elbows, wrist, hip, knee and ankles bilaterally.  Shoulder: Right Inspection reveals no abnormalities, atrophy or asymmetry. Palpation is normal with no tenderness over AC joint or bicipital groove. ROM is full in all planes passively. Rotator cuff strength normal throughout. signs of impingement with positive Neer and Hawkin's tests, but negative empty can sign. Speeds and Yergason's tests normal. No labral pathology noted with negative Obrien's, negative clunk and good stability. Normal scapular function observed. No painful arc and no  drop arm sign. No apprehension sign  MSK US performed of: Right This study was ordered, performed, and interpreted by Terrilee FilesZach Smith D.O.  Shoulder:   Supraspinatus:  Appears normal on long and transverse views, Bursal bulge seen with shoulder abduction on impingement view. Infraspinatus:  Appears normal on long and transverse views. Significant increase in Doppler flow Subscapularis:  Appears normal on long and transverse views.  Positive bursa Teres Minor:  Appears normal on long and transverse views. AC joint:  Capsule undistended, no geyser sign. Glenohumeral Joint:  Appears normal without effusion. Glenoid Labrum:  Intact without visualized tears. Biceps Tendon:  Appears normal on long and transverse views, no fraying of tendon, tendon located in intertubercular groove, no subluxation with shoulder internal or external rotation.  Impression: Subacromial bursitis  Procedure: Real-time Ultrasound Guided Injection of right glenohumeral joint Device: GE Logiq E  Ultrasound guided injection is preferred based studies that show increased duration, increased effect, greater accuracy, decreased procedural pain, increased response rate with ultrasound guided versus blind injection.  Verbal informed consent obtained.  Time-out conducted.  Noted no overlying erythema, induration, or other signs of local infection.  Skin prepped in a sterile fashion.  Local anesthesia: Topical Ethyl chloride.  With sterile technique and under real time ultrasound guidance:  Joint visualized.  23g 1  inch needle inserted posterior approach. Pictures taken for needle placement. Patient did have injection of 2 cc of 1% lidocaine, 2 cc of 0.5% Marcaine, and 1.0 cc of Kenalog 40 mg/dL. Completed without difficulty  Pain immediately resolved suggesting accurate placement of the medication.  Advised to call if fevers/chills, erythema, induration, drainage, or persistent bleeding.  Images permanently stored and available for review in the ultrasound unit.  Impression: Technically successful ultrasound guided injection.      Impression and Recommendations:     This case required medical decision making of moderate complexity.      Note: This dictation was prepared with Dragon dictation along with smaller phrase technology. Any transcriptional errors that result from this process are unintentional.

## 2017-01-29 NOTE — Patient Instructions (Signed)
Good to see you  We injected today  Ice is your friend Ice 20 minutes 2 times daily. Usually after activity and before bed. Exercises 3 times a week.  Keep hands within peripheral vision Make an appointment with me in 4 weeks, if not great we will try the other injection.  Happy 4th!

## 2017-03-01 NOTE — Progress Notes (Signed)
Tawana ScaleZach Beckem Tomberlin D.O. [facility] [address] [place], [place] [address] Phone: [phone] Subjective:    I'm seeing this patient by the request  of:    CC: Right shoulder and arm pain  OZH:[id number]:Subjective  [name] is a 61 y.o. female coming in with complaint of right shoulder pain. Patient has been seen previously for more of a acromioclavicular arthritis as well as a calcific bursitis. Patient was given an injection at last exam for more of a calcific bursitis. States that the pain by approximately 50% but continues to have the pain. Worse with sitting on that side the shoulder. Worse with reaching across her body still.     Past Medical History:  Diagnosis Date  . Allergic rhinitis   . Allergy    Past Surgical History:  Procedure Laterality Date  . BREAST BIOPSY  11-2000   benign  . OOPHORECTOMY    . VAGINAL HYSTERECTOMY     Social History   Social History  . Marital status: Married    Spouse name: N/A  . Number of children: 1  . Years of education: N/A   Occupational History  . investment banking    Social History Main Topics  . Smoking status: Never Smoker  . Smokeless tobacco: Never Used  . Alcohol use 0.0 oz/week     Comment: rare  . Drug use: No  . Sexual activity: Yes    Partners: Male   Other Topics Concern  . None   Social History Narrative   Exercise--no   Allergies  Allergen Reactions  . Sulfonamide Derivatives Swelling  . Penicillins Rash   Family History  Problem Relation Age of Onset  . Breast cancer Mother   . Hypertension Mother   . Stroke Mother   . Pancreatitis Father     Past medical history, social, surgical and family history all reviewed in electronic medical record.  No pertanent information unless stated regarding to the chief complaint.   Review of Systems: No headache, visual changes, nausea, vomiting, diarrhea, constipation, dizziness, abdominal pain, skin rash, fevers, chills, night sweats, weight loss,  swollen lymph nodes, body aches, joint swelling, muscle aches, chest pain, shortness of breath, mood changes.    Objective  Blood pressure 122/72, pulse 86, weight 170 lb (77.1 kg), SpO2 98 %.  Systems examined below as of 03/02/17 General: NAD A&O x3 mood, affect normal  HEENT: Pupils equal, extraocular movements intact no nystagmus Respiratory: not short of breath at rest or with speaking Cardiovascular: No lower extremity edema, non tender Skin: Warm dry intact with no signs of infection or rash on extremities or on axial skeleton. Abdomen: Soft nontender, no masses Neuro: Cranial nerves  intact, neurovascularly intact in all extremities with 2+ DTRs and 2+ pulses. Lymph: No lymphadenopathy appreciated today  Gait normal with good balance and coordination.  MSK: Non tender with full range of motion and good stability and symmetric strength and tone of  elbows, wrist,  knee hips and ankles bilaterally.   Shoulder:right Inspection reveals no abnormalities, atrophy or asymmetry. Palpation is normal with no tenderness over AC joint or bicipital groove. ROM is full in all planes. Rotator cuff strength normal throughout. Positive impingement positive crossover Speeds and Yergason's tests normal. Normal scapular function observed. No painful arc and no drop arm sign. No apprehension sign contralateral shoulder unremarkable   Procedure: Real-time Ultrasound Guided Injection of right acromial clavicular Device: GE Logiq Q7 Ultrasound guided injection is preferred based studies that show  increased duration, increased effect, greater accuracy, decreased procedural pain, increased response rate, and decreased cost with ultrasound guided versus blind injection.  Verbal informed consent obtained.  Time-out conducted.  Noted no overlying erythema, induration, or other signs of local infection.  Skin prepped in a sterile fashion.  Local anesthesia: Topical Ethyl chloride.  With sterile  technique and under real time ultrasound guidance:   25-gauge half-inch needle patient was injected in the right acromial clavicular joint. Patient was given 0.5 mL of 0.5% Marcaine and 0.5 mL of Kenalog 40 mg/dL Completed without difficulty  Pain immediately resolved suggesting accurate placement of the medication.  Advised to call if fevers/chills, erythema, induration, drainage, or persistent bleeding.  Images permanently stored and available for review in the ultrasound unit.  Impression: Technically successful ultrasound guided injection.    Impression and Recommendations:     This case required medical decision making of moderate complexity.      Note: This dictation was prepared with Dragon dictation along with smaller phrase technology. Any transcriptional errors that result from this process are unintentional.

## 2017-03-02 ENCOUNTER — Encounter: Payer: Self-pay | Admitting: Family Medicine

## 2017-03-02 ENCOUNTER — Ambulatory Visit (INDEPENDENT_AMBULATORY_CARE_PROVIDER_SITE_OTHER): Payer: BLUE CROSS/BLUE SHIELD | Admitting: Family Medicine

## 2017-03-02 ENCOUNTER — Ambulatory Visit: Payer: Self-pay

## 2017-03-02 VITALS — BP 122/72 | HR 86 | Wt 170.0 lb

## 2017-03-02 DIAGNOSIS — M25511 Pain in right shoulder: Secondary | ICD-10-CM

## 2017-03-02 DIAGNOSIS — G8929 Other chronic pain: Secondary | ICD-10-CM

## 2017-03-02 NOTE — Patient Instructions (Signed)
Good to see you  [name] is your friend.  [facility]  Stay active Continue the exercises  See me again in 4 weeks.

## 2017-03-02 NOTE — Assessment & Plan Note (Signed)
Patient given injection today. Tolerated the procedure well. We discussed icing regimen and home exercises. Patient will come back and see me again in 4-6 weeks.

## 2017-03-30 ENCOUNTER — Ambulatory Visit (INDEPENDENT_AMBULATORY_CARE_PROVIDER_SITE_OTHER): Payer: BLUE CROSS/BLUE SHIELD | Admitting: Family Medicine

## 2017-03-30 ENCOUNTER — Encounter: Payer: Self-pay | Admitting: Family Medicine

## 2017-03-30 DIAGNOSIS — M7530 Calcific tendinitis of unspecified shoulder: Secondary | ICD-10-CM | POA: Diagnosis not present

## 2017-03-30 NOTE — Patient Instructions (Signed)
Good to see you  I am so sorry not better We will get MR-arthogram of shoulder This will tell us everything and we will discuss.

## 2017-03-30 NOTE — Assessment & Plan Note (Signed)
No improvement.  Worsening, will get MR-arthorgram.  RTC after MRI.

## 2017-03-30 NOTE — Progress Notes (Signed)
[facility] [address] [place], [place] [address] Phone: [phone] Subjective:     CC: Right shoulder and arm pain f/u  HXT:[id number]  [name] is a 61 y.o. female coming in with complaint of right shoulder pain. Patient has been seen previously for more of a acromioclavicular arthritis as well as a calcific bursitis. Patient was given an injection at last exam for more of a calcific bursitis. No improvement with either injection at this time.  Discussed icing regimen.  Has started to affect ADL's a lot more. Waking him up at night.  Worsening.     Past Medical History:  Diagnosis Date  . Allergic rhinitis   . Allergy    Past Surgical History:  Procedure Laterality Date  . BREAST BIOPSY  11-2000   benign  . OOPHORECTOMY    . VAGINAL HYSTERECTOMY     Social History   Social History  . Marital status: Married    Spouse name: N/A  . Number of children: 1  . Years of education: N/A   Occupational History  . investment banking    Social History Main Topics  . Smoking status: Never Smoker  . Smokeless tobacco: Never Used  . Alcohol use 0.0 oz/week     Comment: rare  . Drug use: No  . Sexual activity: Yes    Partners: Male   Other Topics Concern  . None   Social History Narrative   Exercise--no   Allergies  Allergen Reactions  . Sulfonamide Derivatives Swelling  . Penicillins Rash   Family History  Problem Relation Age of Onset  . Breast cancer Mother   . Hypertension Mother   . Stroke Mother   . Pancreatitis Father     Past medical history, social, surgical and family history all reviewed in electronic medical record.  No pertanent information unless stated regarding to the chief complaint.   Review of Systems: No headache, visual changes, nausea, vomiting, diarrhea, constipation, dizziness, abdominal pain, skin rash, fevers, chills, night sweats, weight loss, swollen lymph nodes, body aches, joint swelling, muscle  aches, chest pain, shortness of breath, mood changes.  + muscle aches.    Objective  Blood pressure 118/82, pulse 78, height 5\' 4"  (1.626 m), weight 175 lb (79.4 kg).  Systems examined below as of 03/30/17 General: NAD A&O x3 mood, affect normal  HEENT: Pupils equal, extraocular movements intact no nystagmus Respiratory: not short of breath at rest or with speaking Cardiovascular: No lower extremity edema, non tender Skin: Warm dry intact with no signs of infection or rash on extremities or on axial skeleton. Abdomen: Soft nontender, no masses Neuro: Cranial nerves  intact, neurovascularly intact in all extremities with 2+ DTRs and 2+ pulses. Lymph: No lymphadenopathy appreciated today  Gait normal with good balance and coordination.  MSK: Non tender with full range of motion and good stability and symmetric strength and tone of  elbows, wrist,  knee hips and ankles bilaterally.   Shoulder:right  Inspection reveals no abnormalities, atrophy or asymmetry. Palpation is normal with no tenderness over AC joint or bicipital groove. Lacks the last 15 degrees of internal ROM.  Rotator cuff strength normal throughout. No signs of impingement with negative Neer and Hawkin's tests, empty can sign. Speeds and Yergason's tests normal. Positive Obrien's  Normal scapular function observed. No painful arc and no drop arm sign. No apprehension sign contralateral unremarkable        Impression and Recommendations:  This case required medical decision making of moderate complexity.      Note: This dictation was prepared with Dragon dictation along with smaller phrase technology. Any transcriptional errors that result from this process are unintentional.

## 2017-04-02 ENCOUNTER — Encounter: Payer: Self-pay | Admitting: Family Medicine

## 2017-04-02 ENCOUNTER — Ambulatory Visit (INDEPENDENT_AMBULATORY_CARE_PROVIDER_SITE_OTHER): Payer: BLUE CROSS/BLUE SHIELD | Admitting: Family Medicine

## 2017-04-02 VITALS — BP 120/60 | HR 77 | Temp 97.8°F | Ht 64.0 in | Wt 172.4 lb

## 2017-04-02 DIAGNOSIS — Z0000 Encounter for general adult medical examination without abnormal findings: Secondary | ICD-10-CM

## 2017-04-02 DIAGNOSIS — J301 Allergic rhinitis due to pollen: Secondary | ICD-10-CM | POA: Diagnosis not present

## 2017-04-02 DIAGNOSIS — Z23 Encounter for immunization: Secondary | ICD-10-CM

## 2017-04-02 LAB — POC URINALSYSI DIPSTICK (AUTOMATED)
Bilirubin, UA: NEGATIVE
Blood, UA: NEGATIVE
GLUCOSE UA: NEGATIVE
KETONES UA: NEGATIVE
LEUKOCYTES UA: NEGATIVE
Nitrite, UA: NEGATIVE
PROTEIN UA: NEGATIVE
Spec Grav, UA: 1.01 (ref 1.010–1.025)
Urobilinogen, UA: 0.2 E.U./dL
pH, UA: 7 (ref 5.0–8.0)

## 2017-04-02 LAB — CBC WITH DIFFERENTIAL/PLATELET
Basophils Absolute: 0 10*3/uL (ref 0.0–0.1)
Basophils Relative: 0.3 % (ref 0.0–3.0)
EOS PCT: 1.7 % (ref 0.0–5.0)
Eosinophils Absolute: 0.1 10*3/uL (ref 0.0–0.7)
HCT: 41.4 % (ref 36.0–46.0)
Hemoglobin: 14 g/dL (ref 12.0–15.0)
LYMPHS ABS: 1.5 10*3/uL (ref 0.7–4.0)
Lymphocytes Relative: 35.1 % (ref 12.0–46.0)
MCHC: 33.7 g/dL (ref 30.0–36.0)
MCV: 96.3 fl (ref 78.0–100.0)
MONO ABS: 0.4 10*3/uL (ref 0.1–1.0)
Monocytes Relative: 9.2 % (ref 3.0–12.0)
NEUTROS ABS: 2.3 10*3/uL (ref 1.4–7.7)
NEUTROS PCT: 53.7 % (ref 43.0–77.0)
PLATELETS: 256 10*3/uL (ref 150.0–400.0)
RBC: 4.3 Mil/uL (ref 3.87–5.11)
RDW: 13.8 % (ref 11.5–15.5)
WBC: 4.2 10*3/uL (ref 4.0–10.5)

## 2017-04-02 LAB — COMPREHENSIVE METABOLIC PANEL
ALBUMIN: 4.1 g/dL (ref 3.5–5.2)
ALT: 17 U/L (ref 0–35)
AST: 19 U/L (ref 0–37)
Alkaline Phosphatase: 60 U/L (ref 39–117)
BUN: 14 mg/dL (ref 6–23)
CHLORIDE: 104 meq/L (ref 96–112)
CO2: 31 meq/L (ref 19–32)
CREATININE: 0.74 mg/dL (ref 0.40–1.20)
Calcium: 9.2 mg/dL (ref 8.4–10.5)
GFR: 84.94 mL/min (ref >60.00)
GLUCOSE: 101 mg/dL — AB (ref 70–99)
POTASSIUM: 4.2 meq/L (ref 3.5–5.1)
Sodium: 139 mEq/L (ref 135–145)
Total Bilirubin: 0.6 mg/dL (ref 0.2–1.2)
Total Protein: 7.1 g/dL (ref 6.0–8.3)

## 2017-04-02 LAB — LIPID PANEL
CHOL/HDL RATIO: 2
Cholesterol: 178 mg/dL (ref 0–200)
HDL: 76.7 mg/dL (ref >39.00)
LDL CALC: 93 mg/dL (ref 0–99)
NONHDL: 101.14
Triglycerides: 43 mg/dL (ref 0.0–149.0)
VLDL: 8.6 mg/dL (ref 0.0–40.0)

## 2017-04-02 LAB — TSH: TSH: 1.16 u[IU]/mL (ref 0.35–4.50)

## 2017-04-02 MED ORDER — AZELASTINE HCL 0.15 % NA SOLN: NASAL | 11 refills | Status: DC

## 2017-04-02 MED ORDER — FLUTICASONE PROPIONATE 50 MCG/ACT NA SUSP: NASAL | 11 refills | Status: DC

## 2017-04-02 MED ORDER — LEVOCETIRIZINE DIHYDROCHLORIDE 5 MG PO TABS: 5.0000 mg | ORAL_TABLET | Freq: Every day | ORAL | 3 refills | Status: DC

## 2017-04-02 NOTE — Patient Instructions (Signed)
Preventive Care 40-64 Years, Female Preventive care refers to lifestyle choices and visits with your health care provider that can promote health and wellness. What does preventive care include?  A yearly physical exam. This is also called an annual well check.  Dental exams once or twice a year.  Routine eye exams. Ask your health care provider how often you should have your eyes checked.  Personal lifestyle choices, including: ? Daily care of your teeth and gums. ? Regular physical activity. ? Eating a healthy diet. ? Avoiding tobacco and drug use. ? Limiting alcohol use. ? Practicing safe sex. ? Taking low-dose aspirin daily starting at age 58. ? Taking vitamin and mineral supplements as recommended by your health care provider. What happens during an annual well check? The services and screenings done by your health care provider during your annual well check will depend on your age, overall health, lifestyle risk factors, and family history of disease. Counseling Your health care provider may ask you questions about your:  Alcohol use.  Tobacco use.  Drug use.  Emotional well-being.  Home and relationship well-being.  Sexual activity.  Eating habits.  Work and work Statistician.  Method of birth control.  Menstrual cycle.  Pregnancy history.  Screening You may have the following tests or measurements:  Height, weight, and BMI.  Blood pressure.  Lipid and cholesterol levels. These may be checked every 5 years, or more frequently if you are over 81 years old.  Skin check.  Lung cancer screening. You may have this screening every year starting at age 78 if you have a 30-pack-year history of smoking and currently smoke or have quit within the past 15 years.  Fecal occult blood test (FOBT) of the stool. You may have this test every year starting at age 65.  Flexible sigmoidoscopy or colonoscopy. You may have a sigmoidoscopy every 5 years or a colonoscopy  every 10 years starting at age 30.  Hepatitis C blood test.  Hepatitis B blood test.  Sexually transmitted disease (STD) testing.  Diabetes screening. This is done by checking your blood sugar (glucose) after you have not eaten for a while (fasting). You may have this done every 1-3 years.  Mammogram. This may be done every 1-2 years. Talk to your health care provider about when you should start having regular mammograms. This may depend on whether you have a family history of breast cancer.  BRCA-related cancer screening. This may be done if you have a family history of breast, ovarian, tubal, or peritoneal cancers.  Pelvic exam and Pap test. This may be done every 3 years starting at age 80. Starting at age 36, this may be done every 5 years if you have a Pap test in combination with an HPV test.  Bone density scan. This is done to screen for osteoporosis. You may have this scan if you are at high risk for osteoporosis.  Discuss your test results, treatment options, and if necessary, the need for more tests with your health care provider. Vaccines Your health care provider may recommend certain vaccines, such as:  Influenza vaccine. This is recommended every year.  Tetanus, diphtheria, and acellular pertussis (Tdap, Td) vaccine. You may need a Td booster every 10 years.  Varicella vaccine. You may need this if you have not been vaccinated.  Zoster vaccine. You may need this after age 5.  Measles, mumps, and rubella (MMR) vaccine. You may need at least one dose of MMR if you were born in  1956 or later. You may also need a second dose.  Pneumococcal 13-valent conjugate (PCV13) vaccine. You may need this if you have certain conditions and were not previously vaccinated.  Pneumococcal polysaccharide (PPSV23) vaccine. You may need one or two doses if you smoke cigarettes or if you have certain conditions.  Meningococcal vaccine. You may need this if you have certain  conditions.  Hepatitis A vaccine. You may need this if you have certain conditions or if you travel or work in places where you may be exposed to hepatitis A.  Hepatitis B vaccine. You may need this if you have certain conditions or if you travel or work in places where you may be exposed to hepatitis B.  Haemophilus influenzae type b (Hib) vaccine. You may need this if you have certain conditions.  Talk to your health care provider about which screenings and vaccines you need and how often you need them. This information is not intended to replace advice given to you by your health care provider. Make sure you discuss any questions you have with your health care provider. Document Released: 08/24/2015 Document Revised: 04/16/2016 Document Reviewed: 05/29/2015 Elsevier Interactive Patient Education  2017 [employer].

## 2017-04-02 NOTE — Progress Notes (Signed)
Subjective:     [name] is a 61 y.o. female and is here for a comprehensive physical exam. The patient reports no problems.  Social History   Social History  . Marital status: Married    Spouse name: N/A  . Number of children: 1  . Years of education: N/A   Occupational History  . investment banking    Social History Main Topics  . Smoking status: Never Smoker  . Smokeless tobacco: Never Used  . Alcohol use 0.0 oz/week     Comment: rare  . Drug use: No  . Sexual activity: Yes    Partners: Male   Other Topics Concern  . Not on file   Social History Narrative   Exercise--no   Health Maintenance  Topic Date Due  . HIV Screening  10/05/1971  . TETANUS/TDAP  07/26/2014  . INFLUENZA VACCINE  03/11/2017  . COLONOSCOPY  06/25/2017  . PAP SMEAR  09/20/2017  . MAMMOGRAM  10/09/2017  . Hepatitis C Screening  Completed    The following portions of the patient's history were reviewed and updated as appropriate:  She  has a past medical history of Allergic rhinitis and Allergy. She  does not have any pertinent problems on file. She  has a past surgical history that includes Breast biopsy (11-2000); Vaginal hysterectomy; and Oophorectomy. Her family history includes Breast cancer in her mother; Hypertension in her mother; Pancreatitis in her father; Stroke in her mother. She  reports that she has never smoked. She has never used smokeless tobacco. She reports that she drinks alcohol. She reports that she does not use drugs. She has a current medication list which includes the following prescription(s): azelastine hcl, diclofenac sodium, estriol, fluticasone, and levocetirizine. Current Outpatient Prescriptions on File Prior to Visit  Medication Sig Dispense Refill  . Diclofenac Sodium (PENNSAID) 2 % SOLN Place 2 application onto the skin 2 (two) times daily. 112 g 3  . Estriol POWD      No current facility-administered medications on file prior to visit.    She is  allergic to sulfonamide derivatives and penicillins..  Review of Systems Review of Systems  Constitutional: Negative for activity change, appetite change and fatigue.  HENT: Negative for hearing loss, congestion, tinnitus and ear discharge.  dentist q80m Eyes: Negative for visual disturbance (see optho q1y -- vision corrected to 20/20 with glasses).  Respiratory: Negative for cough, chest tightness and shortness of breath.   Cardiovascular: Negative for chest pain, palpitations and leg swelling.  Gastrointestinal: Negative for abdominal pain, diarrhea, constipation and abdominal distention.  Genitourinary: Negative for urgency, frequency, decreased urine volume and difficulty urinating.  Musculoskeletal: Negative for back pain, arthralgias and gait problem.  Skin: Negative for color change, pallor and rash.  Neurological: Negative for dizziness, light-headedness, numbness and headaches.  Hematological: Negative for adenopathy. Does not bruise/bleed easily.  Psychiatric/Behavioral: Negative for suicidal ideas, confusion, sleep disturbance, self-injury, dysphoric mood, decreased concentration and agitation.       Objective:    BP 120/60 (BP Location: Left Arm, Patient Position: Sitting, Cuff Size: Normal)   Pulse 77   Temp 97.8 F (36.6 C) (Oral)   Ht 5\' 4"  (1.626 m)   Wt 172 lb 6.4 oz (78.2 kg)   SpO2 97%   BMI 29.59 kg/m  General appearance: alert, cooperative, appears stated age and no distress Head: Normocephalic, without obvious abnormality, atraumatic Eyes: conjunctivae/corneas clear. PERRL, EOM's intact. Fundi benign. Ears: normal TM's and external ear canals both ears  Nose: Nares normal. Septum midline. Mucosa normal. No drainage or sinus tenderness. Throat: lips, mucosa, and tongue normal; teeth and gums normal Neck: no adenopathy, no carotid bruit, no JVD, supple, symmetrical, trachea midline and thyroid not enlarged, symmetric, no tenderness/mass/nodules Back:  symmetric, no curvature. ROM normal. No CVA tenderness. Lungs: clear to auscultation bilaterally Breasts: gyn Heart: regular rate and rhythm, S1, S2 normal, no murmur, click, rub or gallop Abdomen: soft, non-tender; bowel sounds normal; no masses,  no organomegaly Pelvic: deferred--gyn Extremities: extremities normal, atraumatic, no cyanosis or edema Pulses: 2+ and symmetric Skin: Skin color, texture, turgor normal. No rashes or lesions Lymph nodes: Cervical, supraclavicular, and axillary nodes normal. Neurologic: Alert and oriented X 3, normal strength and tone. Normal symmetric reflexes. Normal coordination and gait    Assessment:    Healthy female exam.      Plan:    ghm utd Check labs  See After Visit Summary for Counseling Recommendations    1. Seasonal allergic rhinitis due to pollen Stable-- refill meds - Azelastine HCl 0.15 % SOLN; INSTILL 2 SPRAYS IN EACH NOSTRIL DAILY.  Dispense: 30 mL; Refill: 11 - fluticasone (FLONASE) 50 MCG/ACT nasal spray; PLACE 2 SPRAYS INTO THE NOSE DAILY.  Dispense: 16 g; Refill: 11 - levocetirizine (XYZAL) 5 MG tablet; Take 1 tablet (5 mg total) by mouth daily.  Dispense: 90 tablet; Refill: 3  2. Preventative health care See above - TSH - CBC with Differential/Platelet - Lipid panel - Comprehensive metabolic panel - POCT Urinalysis Dipstick (Automated)  3. Need for Tdap vaccination   - Tdap vaccine greater than or equal to 7yo IM

## 2017-08-25 DIAGNOSIS — D225 Melanocytic nevi of trunk: Secondary | ICD-10-CM | POA: Diagnosis not present

## 2017-08-25 DIAGNOSIS — L820 Inflamed seborrheic keratosis: Secondary | ICD-10-CM | POA: Diagnosis not present

## 2017-08-25 DIAGNOSIS — D224 Melanocytic nevi of scalp and neck: Secondary | ICD-10-CM | POA: Diagnosis not present

## 2017-08-25 DIAGNOSIS — L821 Other seborrheic keratosis: Secondary | ICD-10-CM | POA: Diagnosis not present

## 2017-08-25 DIAGNOSIS — D2261 Melanocytic nevi of right upper limb, including shoulder: Secondary | ICD-10-CM | POA: Diagnosis not present

## 2017-08-25 DIAGNOSIS — D1801 Hemangioma of skin and subcutaneous tissue: Secondary | ICD-10-CM | POA: Diagnosis not present

## 2017-08-25 DIAGNOSIS — D485 Neoplasm of uncertain behavior of skin: Secondary | ICD-10-CM | POA: Diagnosis not present

## 2017-09-09 DIAGNOSIS — D485 Neoplasm of uncertain behavior of skin: Secondary | ICD-10-CM | POA: Diagnosis not present

## 2017-09-09 DIAGNOSIS — D225 Melanocytic nevi of trunk: Secondary | ICD-10-CM | POA: Diagnosis not present

## 2017-09-16 DIAGNOSIS — H52223 Regular astigmatism, bilateral: Secondary | ICD-10-CM | POA: Diagnosis not present

## 2017-09-16 DIAGNOSIS — H2513 Age-related nuclear cataract, bilateral: Secondary | ICD-10-CM | POA: Diagnosis not present

## 2017-09-16 DIAGNOSIS — H5213 Myopia, bilateral: Secondary | ICD-10-CM | POA: Diagnosis not present

## 2017-09-16 DIAGNOSIS — H524 Presbyopia: Secondary | ICD-10-CM | POA: Diagnosis not present

## 2017-09-29 ENCOUNTER — Ambulatory Visit: Payer: BLUE CROSS/BLUE SHIELD | Admitting: Family Medicine

## 2017-09-29 ENCOUNTER — Encounter: Payer: Self-pay | Admitting: Family Medicine

## 2017-09-29 VITALS — BP 114/53 | HR 73 | Temp 97.8°F | Resp 16 | Ht 64.0 in | Wt 168.6 lb

## 2017-09-29 DIAGNOSIS — J324 Chronic pansinusitis: Secondary | ICD-10-CM

## 2017-09-29 MED ORDER — CLARITHROMYCIN ER 500 MG PO TB24: 1000.0000 mg | ORAL_TABLET | Freq: Every day | ORAL | 0 refills | Status: AC

## 2017-09-29 NOTE — Progress Notes (Signed)
Patient ID: [name], female    DOB: 1956/07/07  Age: 62 y.o. MRN: [id number]    Subjective:  Subjective  HPI [name] presents for c/o sinus congestion , sore throat and headache x 2 weeks -- using otc with no relief.    Review of Systems  Constitutional: Positive for chills. Negative for fever.  HENT: Positive for congestion, postnasal drip, rhinorrhea and sinus pressure.   Respiratory: Negative for cough, chest tightness, shortness of breath and wheezing.   Cardiovascular: Negative for chest pain, palpitations and leg swelling.  Allergic/Immunologic: Negative for environmental allergies.    History Past Medical History:  Diagnosis Date  . Allergic rhinitis   . Allergy     She has a past surgical history that includes Breast biopsy (11-2000); Vaginal hysterectomy; and Oophorectomy.   Her family history includes Breast cancer in her mother; Hypertension in her mother; Pancreatitis in her father; Stroke in her mother.She reports that  has never smoked. she has never used smokeless tobacco. She reports that she drinks alcohol. She reports that she does not use drugs.  Current Outpatient Medications on File Prior to Visit  Medication Sig Dispense Refill  . Azelastine HCl 0.15 % SOLN INSTILL 2 SPRAYS IN EACH NOSTRIL DAILY. 30 mL 11  . Diclofenac Sodium (PENNSAID) 2 % SOLN Place 2 application onto the skin 2 (two) times daily. 112 g 3  . Estriol POWD     . fluticasone (FLONASE) 50 MCG/ACT nasal spray PLACE 2 SPRAYS INTO THE NOSE DAILY. 16 g 11  . levocetirizine (XYZAL) 5 MG tablet Take 1 tablet (5 mg total) by mouth daily. 90 tablet 3   No current facility-administered medications on file prior to visit.      Objective:  Objective  Physical Exam  Constitutional: She is oriented to person, place, and time. She appears well-developed and well-nourished.  HENT:  Right Ear: External ear normal.  Left Ear: External ear normal.  Nose: Right sinus exhibits maxillary sinus  tenderness and frontal sinus tenderness. Left sinus exhibits maxillary sinus tenderness and frontal sinus tenderness.  Mouth/Throat: Posterior oropharyngeal erythema present. No oropharyngeal exudate or posterior oropharyngeal edema.  + PND + errythema  Eyes: Conjunctivae are normal. Right eye exhibits no discharge. Left eye exhibits no discharge.  Cardiovascular: Normal rate, regular rhythm and normal heart sounds.  No murmur heard. Pulmonary/Chest: Effort normal and breath sounds normal. No respiratory distress. She has no wheezes. She has no rales. She exhibits no tenderness.  Musculoskeletal: She exhibits no edema.  Lymphadenopathy:    She has cervical adenopathy.  Neurological: She is alert and oriented to person, place, and time.  Nursing note and vitals reviewed.  BP (!) 114/53 (BP Location: Right Arm, Patient Position: Sitting, Cuff Size: Large)   Pulse 73   Temp 97.8 F (36.6 C) (Oral)   Resp 16   Ht 5\' 4"  (1.626 m)   Wt 168 lb 9.6 oz (76.5 kg)   SpO2 100%   BMI 28.94 kg/m  Wt Readings from Last 3 Encounters:  09/29/17 168 lb 9.6 oz (76.5 kg)  04/02/17 172 lb 6.4 oz (78.2 kg)  03/30/17 175 lb (79.4 kg)     Lab Results  Component Value Date   WBC 4.2 04/02/2017   HGB 14.0 04/02/2017   HCT 41.4 04/02/2017   PLT 256.0 04/02/2017   GLUCOSE 101 (H) 04/02/2017   CHOL 178 04/02/2017   TRIG 43.0 04/02/2017   HDL 76.70 04/02/2017   LDLCALC 93 04/02/2017  ALT 17 04/02/2017   AST 19 04/02/2017   NA 139 04/02/2017   K 4.2 04/02/2017   CL 104 04/02/2017   CREATININE 0.74 04/02/2017   BUN 14 04/02/2017   CO2 31 04/02/2017   TSH 1.16 04/02/2017    No results found.   Assessment & Plan:  Plan  I am having [name] start on clarithromycin. I am also having her maintain her Estriol, Diclofenac Sodium, Azelastine HCl, fluticasone, and levocetirizine.  Meds ordered this encounter  Medications  . clarithromycin (BIAXIN XL) 500 MG 24 hr tablet    Sig: Take 2  tablets (1,000 mg total) by mouth daily for 10 days.    Dispense:  20 tablet    Refill:  0    Problem List Items Addressed This Visit    None    Visit Diagnoses    Pansinusitis, unspecified chronicity    -  Primary   Relevant Medications   clarithromycin (BIAXIN XL) 500 MG 24 hr tablet    con't xyzal and flonase rto prn   Follow-up: Return if symptoms worsen or fail to improve.  Donato SchultzYvonne R Lowne Chase, DO

## 2017-09-29 NOTE — Patient Instructions (Signed)

## 2017-11-17 ENCOUNTER — Encounter: Payer: Self-pay | Admitting: Family Medicine

## 2017-11-17 DIAGNOSIS — Z1211 Encounter for screening for malignant neoplasm of colon: Secondary | ICD-10-CM | POA: Diagnosis not present

## 2017-11-17 DIAGNOSIS — Z86010 Personal history of colonic polyps: Secondary | ICD-10-CM | POA: Diagnosis not present

## 2017-12-14 DIAGNOSIS — Z09 Encounter for follow-up examination after completed treatment for conditions other than malignant neoplasm: Secondary | ICD-10-CM | POA: Diagnosis not present

## 2017-12-14 DIAGNOSIS — L820 Inflamed seborrheic keratosis: Secondary | ICD-10-CM | POA: Diagnosis not present

## 2018-01-01 DIAGNOSIS — Z6828 Body mass index (BMI) 28.0-28.9, adult: Secondary | ICD-10-CM | POA: Diagnosis not present

## 2018-01-01 DIAGNOSIS — Z01419 Encounter for gynecological examination (general) (routine) without abnormal findings: Secondary | ICD-10-CM | POA: Diagnosis not present

## 2018-01-01 DIAGNOSIS — Z1231 Encounter for screening mammogram for malignant neoplasm of breast: Secondary | ICD-10-CM | POA: Diagnosis not present

## 2018-01-01 DIAGNOSIS — Z13820 Encounter for screening for osteoporosis: Secondary | ICD-10-CM | POA: Diagnosis not present

## 2018-01-01 LAB — HM MAMMOGRAPHY

## 2018-01-01 LAB — HM PAP SMEAR: HM PAP: NEGATIVE

## 2018-01-01 LAB — HM DEXA SCAN

## 2018-02-12 DIAGNOSIS — H1045 Other chronic allergic conjunctivitis: Secondary | ICD-10-CM | POA: Diagnosis not present

## 2018-02-26 DIAGNOSIS — H1089 Other conjunctivitis: Secondary | ICD-10-CM | POA: Diagnosis not present

## 2018-05-06 ENCOUNTER — Encounter: Payer: Self-pay | Admitting: Family Medicine

## 2018-05-06 ENCOUNTER — Ambulatory Visit (INDEPENDENT_AMBULATORY_CARE_PROVIDER_SITE_OTHER): Payer: BLUE CROSS/BLUE SHIELD | Admitting: Family Medicine

## 2018-05-06 VITALS — BP 116/60 | HR 67 | Temp 98.4°F | Resp 16 | Ht 64.0 in | Wt 165.6 lb

## 2018-05-06 DIAGNOSIS — Z23 Encounter for immunization: Secondary | ICD-10-CM

## 2018-05-06 DIAGNOSIS — Z0000 Encounter for general adult medical examination without abnormal findings: Secondary | ICD-10-CM

## 2018-05-06 DIAGNOSIS — J301 Allergic rhinitis due to pollen: Secondary | ICD-10-CM

## 2018-05-06 LAB — COMPREHENSIVE METABOLIC PANEL
ALBUMIN: 4 g/dL (ref 3.5–5.2)
ALT: 17 U/L (ref 0–35)
AST: 20 U/L (ref 0–37)
Alkaline Phosphatase: 53 U/L (ref 39–117)
BUN: 18 mg/dL (ref 6–23)
CHLORIDE: 104 meq/L (ref 96–112)
CO2: 28 meq/L (ref 19–32)
Calcium: 8.8 mg/dL (ref 8.4–10.5)
Creatinine, Ser: 0.75 mg/dL (ref 0.40–1.20)
GFR: 83.34 mL/min (ref >60.00)
GLUCOSE: 92 mg/dL (ref 70–99)
POTASSIUM: 4 meq/L (ref 3.5–5.1)
SODIUM: 138 meq/L (ref 135–145)
Total Bilirubin: 0.5 mg/dL (ref 0.2–1.2)
Total Protein: 6.8 g/dL (ref 6.0–8.3)

## 2018-05-06 LAB — CBC WITH DIFFERENTIAL/PLATELET
Basophils Absolute: 0 10*3/uL (ref 0.0–0.1)
Basophils Relative: 0.2 % (ref 0.0–3.0)
EOS ABS: 0 10*3/uL (ref 0.0–0.7)
EOS PCT: 1.1 % (ref 0.0–5.0)
HEMATOCRIT: 40.5 % (ref 36.0–46.0)
HEMOGLOBIN: 13.7 g/dL (ref 12.0–15.0)
Lymphocytes Relative: 33.3 % (ref 12.0–46.0)
Lymphs Abs: 1.5 10*3/uL (ref 0.7–4.0)
MCHC: 33.9 g/dL (ref 30.0–36.0)
MCV: 94.8 fl (ref 78.0–100.0)
MONO ABS: 0.4 10*3/uL (ref 0.1–1.0)
Monocytes Relative: 8.3 % (ref 3.0–12.0)
NEUTROS ABS: 2.5 10*3/uL (ref 1.4–7.7)
Neutrophils Relative %: 57.1 % (ref 43.0–77.0)
PLATELETS: 216 10*3/uL (ref 150.0–400.0)
RBC: 4.27 Mil/uL (ref 3.87–5.11)
RDW: 13.3 % (ref 11.5–15.5)
WBC: 4.4 10*3/uL (ref 4.0–10.5)

## 2018-05-06 LAB — LIPID PANEL
Cholesterol: 155 mg/dL (ref 0–200)
HDL: 73.3 mg/dL (ref >39.00)
LDL CALC: 71 mg/dL (ref 0–99)
NONHDL: 81.25
Total CHOL/HDL Ratio: 2
Triglycerides: 50 mg/dL (ref 0.0–149.0)
VLDL: 10 mg/dL (ref 0.0–40.0)

## 2018-05-06 LAB — TSH: TSH: 1.29 u[IU]/mL (ref 0.35–4.50)

## 2018-05-06 MED ORDER — AZELASTINE HCL 0.15 % NA SOLN: NASAL | 11 refills | Status: DC

## 2018-05-06 MED ORDER — FLUTICASONE PROPIONATE 50 MCG/ACT NA SUSP: NASAL | 11 refills | Status: DC

## 2018-05-06 MED ORDER — LEVOCETIRIZINE DIHYDROCHLORIDE 5 MG PO TABS: 5.0000 mg | ORAL_TABLET | Freq: Every day | ORAL | 3 refills | Status: DC

## 2018-05-06 NOTE — Progress Notes (Signed)
Subjective:     [name] is a 62 y.o. female and is here for a comprehensive physical exam. The patient reports no problems.  Social History   Socioeconomic History  . Marital status: Married    Spouse name: Not on file  . Number of children: 1  . Years of education: Not on file  . Highest education level: Not on file  Occupational History  . Occupation: International aid/development worker  Social Needs  . Financial resource strain: Not on file  . Food insecurity:    Worry: Not on file    Inability: Not on file  . Transportation needs:    Medical: Not on file    Non-medical: Not on file  Tobacco Use  . Smoking status: Never Smoker  . Smokeless tobacco: Never Used  Substance and Sexual Activity  . Alcohol use: Yes    Comment: rare  . Drug use: No  . Sexual activity: Yes    Partners: Male  Lifestyle  . Physical activity:    Days per week: Not on file    Minutes per session: Not on file  . Stress: Not on file  Relationships  . Social connections:    Talks on phone: Not on file    Gets together: Not on file    Attends religious service: Not on file    Active member of club or organization: Not on file    Attends meetings of clubs or organizations: Not on file    Relationship status: Not on file  . Intimate partner violence:    Fear of current or ex partner: Not on file    Emotionally abused: Not on file    Physically abused: Not on file    Forced sexual activity: Not on file  Other Topics Concern  . Not on file  Social History Narrative   Exercise--no   Health Maintenance  Topic Date Due  . MAMMOGRAM  10/09/2016  . PAP SMEAR  09/20/2017  . INFLUENZA VACCINE  03/11/2018  . HIV Screening  05/06/2024 (Originally 10/05/1970)  . COLONOSCOPY  11/18/2022  . TETANUS/TDAP  04/03/2027  . Hepatitis C Screening  Completed    The following portions of the patient's history were reviewed and updated as appropriate:  She  has a past medical history of Allergic rhinitis and  Allergy. She does not have any pertinent problems on file. She  has a past surgical history that includes Breast biopsy (11-2000); Vaginal hysterectomy; and Oophorectomy. Her family history includes Breast cancer in her mother; Hypertension in her mother; Pancreatitis in her father; Stroke in her mother. She  reports that she has never smoked. She has never used smokeless tobacco. She reports that she drinks alcohol. She reports that she does not use drugs. She has a current medication list which includes the following prescription(s): azelastine hcl, estriol, fluticasone, and levocetirizine. Current Outpatient Medications on File Prior to Visit  Medication Sig Dispense Refill  . Estriol POWD      No current facility-administered medications on file prior to visit.    She is allergic to sulfonamide derivatives and penicillins..  Review of Systems Review of Systems  Constitutional: Negative for activity change, appetite change and fatigue.  HENT: Negative for hearing loss, congestion, tinnitus and ear discharge.  dentist q28m Eyes: Negative for visual disturbance (see optho q1y -- vision corrected to 20/20 with glasses).  Respiratory: Negative for cough, chest tightness and shortness of breath.   Cardiovascular: Negative for chest pain, palpitations and leg  swelling.  Gastrointestinal: Negative for abdominal pain, diarrhea, constipation and abdominal distention.  Genitourinary: Negative for urgency, frequency, decreased urine volume and difficulty urinating.  Musculoskeletal: Negative for back pain, arthralgias and gait problem.  Skin: Negative for color change, pallor and rash.  Neurological: Negative for dizziness, light-headedness, numbness and headaches.  Hematological: Negative for adenopathy. Does not bruise/bleed easily.  Psychiatric/Behavioral: Negative for suicidal ideas, confusion, sleep disturbance, self-injury, dysphoric mood, decreased concentration and agitation.        Objective:    BP 116/60 (BP Location: Right Arm, Cuff Size: Normal)   Pulse 67   Temp 98.4 F (36.9 C) (Oral)   Resp 16   Ht 5\' 4"  (1.626 m)   Wt 165 lb 9.6 oz (75.1 kg)   SpO2 98%   BMI 28.43 kg/m  General appearance: alert, cooperative, appears stated age and no distress Head: Normocephalic, without obvious abnormality, atraumatic Eyes: conjunctivae/corneas clear. PERRL, EOM's intact. Fundi benign. Ears: normal TM's and external ear canals both ears Nose: Nares normal. Septum midline. Mucosa normal. No drainage or sinus tenderness. Throat: lips, mucosa, and tongue normal; teeth and gums normal Neck: no adenopathy, no carotid bruit, no JVD, supple, symmetrical, trachea midline and thyroid not enlarged, symmetric, no tenderness/mass/nodules Back: symmetric, no curvature. ROM normal. No CVA tenderness. Lungs: clear to auscultation bilaterally Breasts: gyn Heart: regular rate and rhythm, S1, S2 normal, no murmur, click, rub or gallop Abdomen: soft, non-tender; bowel sounds normal; no masses,  no organomegaly Pelvic: deferred--gyn Extremities: extremities normal, atraumatic, no cyanosis or edema Pulses: 2+ and symmetric Skin: Skin color, texture, turgor normal. No rashes or lesions Lymph nodes: Cervical, supraclavicular, and axillary nodes normal. Neurologic: Alert and oriented X 3, normal strength and tone. Normal symmetric reflexes. Normal coordination and gait    Assessment:    Healthy female exam.     Plan:    ghm utd Check labs See After Visit Summary for Counseling Recommendations    1. Preventative health care See above - CBC with Differential/Platelet - Comprehensive metabolic panel - Lipid panel - TSH  2. Seasonal allergic rhinitis due to pollen  - levocetirizine (XYZAL) 5 MG tablet; Take 1 tablet (5 mg total) by mouth daily.  Dispense: 90 tablet; Refill: 3 - fluticasone (FLONASE) 50 MCG/ACT nasal spray; PLACE 2 SPRAYS INTO THE NOSE DAILY.   Dispense: 16 g; Refill: 11 - Azelastine HCl 0.15 % SOLN; INSTILL 2 SPRAYS IN EACH NOSTRIL DAILY.  Dispense: 30 mL; Refill: 11  3. Need for shingles vaccine  - Varicella-zoster vaccine IM (Shingrix)  4. Influenza vaccine administered  - Flu Vaccine QUAD 6+ mos PF IM (Fluarix Quad PF)

## 2018-05-06 NOTE — Patient Instructions (Signed)
Preventive Care 40-64 Years, Female Preventive care refers to lifestyle choices and visits with your health care provider that can promote health and wellness. What does preventive care include?  A yearly physical exam. This is also called an annual well check.  Dental exams once or twice a year.  Routine eye exams. Ask your health care provider how often you should have your eyes checked.  Personal lifestyle choices, including: ? Daily care of your teeth and gums. ? Regular physical activity. ? Eating a healthy diet. ? Avoiding tobacco and drug use. ? Limiting alcohol use. ? Practicing safe sex. ? Taking low-dose aspirin daily starting at age 58. ? Taking vitamin and mineral supplements as recommended by your health care provider. What happens during an annual well check? The services and screenings done by your health care provider during your annual well check will depend on your age, overall health, lifestyle risk factors, and family history of disease. Counseling Your health care provider may ask you questions about your:  Alcohol use.  Tobacco use.  Drug use.  Emotional well-being.  Home and relationship well-being.  Sexual activity.  Eating habits.  Work and work Statistician.  Method of birth control.  Menstrual cycle.  Pregnancy history.  Screening You may have the following tests or measurements:  Height, weight, and BMI.  Blood pressure.  Lipid and cholesterol levels. These may be checked every 5 years, or more frequently if you are over 81 years old.  Skin check.  Lung cancer screening. You may have this screening every year starting at age 78 if you have a 30-pack-year history of smoking and currently smoke or have quit within the past 15 years.  Fecal occult blood test (FOBT) of the stool. You may have this test every year starting at age 65.  Flexible sigmoidoscopy or colonoscopy. You may have a sigmoidoscopy every 5 years or a colonoscopy  every 10 years starting at age 30.  Hepatitis C blood test.  Hepatitis B blood test.  Sexually transmitted disease (STD) testing.  Diabetes screening. This is done by checking your blood sugar (glucose) after you have not eaten for a while (fasting). You may have this done every 1-3 years.  Mammogram. This may be done every 1-2 years. Talk to your health care provider about when you should start having regular mammograms. This may depend on whether you have a family history of breast cancer.  BRCA-related cancer screening. This may be done if you have a family history of breast, ovarian, tubal, or peritoneal cancers.  Pelvic exam and Pap test. This may be done every 3 years starting at age 80. Starting at age 36, this may be done every 5 years if you have a Pap test in combination with an HPV test.  Bone density scan. This is done to screen for osteoporosis. You may have this scan if you are at high risk for osteoporosis.  Discuss your test results, treatment options, and if necessary, the need for more tests with your health care provider. Vaccines Your health care provider may recommend certain vaccines, such as:  Influenza vaccine. This is recommended every year.  Tetanus, diphtheria, and acellular pertussis (Tdap, Td) vaccine. You may need a Td booster every 10 years.  Varicella vaccine. You may need this if you have not been vaccinated.  Zoster vaccine. You may need this after age 5.  Measles, mumps, and rubella (MMR) vaccine. You may need at least one dose of MMR if you were born in  1956 or later. You may also need a second dose.  Pneumococcal 13-valent conjugate (PCV13) vaccine. You may need this if you have certain conditions and were not previously vaccinated.  Pneumococcal polysaccharide (PPSV23) vaccine. You may need one or two doses if you smoke cigarettes or if you have certain conditions.  Meningococcal vaccine. You may need this if you have certain  conditions.  Hepatitis A vaccine. You may need this if you have certain conditions or if you travel or work in places where you may be exposed to hepatitis A.  Hepatitis B vaccine. You may need this if you have certain conditions or if you travel or work in places where you may be exposed to hepatitis B.  Haemophilus influenzae type b (Hib) vaccine. You may need this if you have certain conditions.  Talk to your health care provider about which screenings and vaccines you need and how often you need them. This information is not intended to replace advice given to you by your health care provider. Make sure you discuss any questions you have with your health care provider. Document Released: 08/24/2015 Document Revised: 04/16/2016 Document Reviewed: 05/29/2015 Elsevier Interactive Patient Education  2018 Elsevier Inc[employer]  

## 2018-05-24 ENCOUNTER — Telehealth: Payer: Self-pay | Admitting: *Deleted

## 2018-05-24 NOTE — Telephone Encounter (Signed)
[facility] forwarded to provider/[facility] 10/14

## 2018-06-08 ENCOUNTER — Encounter: Payer: Self-pay | Admitting: Family Medicine

## 2018-07-21 ENCOUNTER — Ambulatory Visit (INDEPENDENT_AMBULATORY_CARE_PROVIDER_SITE_OTHER): Payer: BLUE CROSS/BLUE SHIELD

## 2018-07-21 DIAGNOSIS — Z23 Encounter for immunization: Secondary | ICD-10-CM | POA: Diagnosis not present

## 2018-08-02 ENCOUNTER — Encounter: Payer: Self-pay | Admitting: Family Medicine

## 2018-08-02 ENCOUNTER — Ambulatory Visit: Payer: BLUE CROSS/BLUE SHIELD | Admitting: Family Medicine

## 2018-08-02 VITALS — BP 116/66 | HR 73 | Temp 98.4°F | Resp 16 | Ht 64.0 in | Wt 163.8 lb

## 2018-08-02 DIAGNOSIS — J324 Chronic pansinusitis: Secondary | ICD-10-CM

## 2018-08-02 MED ORDER — DOXYCYCLINE HYCLATE 100 MG PO TABS: 100.0000 mg | ORAL_TABLET | Freq: Two times a day (BID) | ORAL | 0 refills | Status: DC

## 2018-08-02 NOTE — Patient Instructions (Signed)
Sinusitis, Adult  Sinusitis is inflammation of your sinuses. Sinuses are hollow spaces in the bones around your face. Your sinuses are located:   Around your eyes.   In the middle of your forehead.   Behind your nose.   In your cheekbones.  Mucus normally drains out of your sinuses. When your nasal tissues become inflamed or swollen, mucus can become trapped or blocked. This allows bacteria, viruses, and fungi to grow, which leads to infection. Most infections of the sinuses are caused by a virus.  Sinusitis can develop quickly. It can last for up to 4 weeks (acute) or for more than 12 weeks (chronic). Sinusitis often develops after a cold.  What are the causes?  This condition is caused by anything that creates swelling in the sinuses or stops mucus from draining. This includes:   Allergies.   Asthma.   Infection from bacteria or viruses.   Deformities or blockages in your nose or sinuses.   Abnormal growths in the nose (nasal polyps).   Pollutants, such as chemicals or irritants in the air.   Infection from fungi (rare).  What increases the risk?  You are more likely to develop this condition if you:   Have a weak body defense system (immune system).   Do a lot of swimming or diving.   Overuse nasal sprays.   Smoke.  What are the signs or symptoms?  The main symptoms of this condition are pain and a feeling of pressure around the affected sinuses. Other symptoms include:   Stuffy nose or congestion.   Thick drainage from your nose.   Swelling and warmth over the affected sinuses.   Headache.   Upper toothache.   A cough that may get worse at night.   Extra mucus that collects in the throat or the back of the nose (postnasal drip).   Decreased sense of smell and taste.   Fatigue.   A fever.   Sore throat.   Bad breath.  How is this diagnosed?  This condition is diagnosed based on:   Your symptoms.   Your medical history.   A physical exam.   Tests to find out if your condition is  acute or chronic. This may include:  ? Checking your nose for nasal polyps.  ? Viewing your sinuses using a device that has a light (endoscope).  ? Testing for allergies or bacteria.  ? Imaging tests, such as an MRI or CT scan.  In rare cases, a bone biopsy may be done to rule out more serious types of fungal sinus disease.  How is this treated?  Treatment for sinusitis depends on the cause and whether your condition is chronic or acute.   If caused by a virus, your symptoms should go away on their own within 10 days. You may be given medicines to relieve symptoms. They include:  ? Medicines that shrink swollen nasal passages (topical intranasal decongestants).  ? Medicines that treat allergies (antihistamines).  ? A spray that eases inflammation of the nostrils (topical intranasal corticosteroids).  ? Rinses that help get rid of thick mucus in your nose (nasal saline washes).   If caused by bacteria, your health care provider may recommend waiting to see if your symptoms improve. Most bacterial infections will get better without antibiotic medicine. You may be given antibiotics if you have:  ? A severe infection.  ? A weak immune system.   If caused by narrow nasal passages or nasal polyps, you may need   to have surgery.  Follow these instructions at home:  Medicines   Take, use, or apply over-the-counter and prescription medicines only as told by your health care provider. These may include nasal sprays.   If you were prescribed an antibiotic medicine, take it as told by your health care provider. Do not stop taking the antibiotic even if you start to feel better.  Hydrate and humidify     Drink enough fluid to keep your urine pale yellow. Staying hydrated will help to thin your mucus.   Use a cool mist humidifier to keep the humidity level in your home above 50%.   Inhale steam for 10-15 minutes, 3-4 times a day, or as told by your health care provider. You can do this in the bathroom while a hot shower is  running.   Limit your exposure to cool or dry air.  Rest   Rest as much as possible.   Sleep with your head raised (elevated).   Make sure you get enough sleep each night.  General instructions     Apply a warm, moist washcloth to your face 3-4 times a day or as told by your health care provider. This will help with discomfort.   Wash your hands often with soap and water to reduce your exposure to germs. If soap and water are not available, use hand sanitizer.   Do not smoke. Avoid being around people who are smoking (secondhand smoke).   Keep all follow-up visits as told by your health care provider. This is important.  Contact a health care provider if:   You have a fever.   Your symptoms get worse.   Your symptoms do not improve within 10 days.  Get help right away if:   You have a severe headache.   You have persistent vomiting.   You have severe pain or swelling around your face or eyes.   You have vision problems.   You develop confusion.   Your neck is stiff.   You have trouble breathing.  Summary   Sinusitis is soreness and inflammation of your sinuses. Sinuses are hollow spaces in the bones around your face.   This condition is caused by nasal tissues that become inflamed or swollen. The swelling traps or blocks the flow of mucus. This allows bacteria, viruses, and fungi to grow, which leads to infection.   If you were prescribed an antibiotic medicine, take it as told by your health care provider. Do not stop taking the antibiotic even if you start to feel better.   Keep all follow-up visits as told by your health care provider. This is important.  This information is not intended to replace advice given to you by your health care provider. Make sure you discuss any questions you have with your health care provider.  Document Released: 07/28/2005 Document Revised: 12/28/2017 Document Reviewed: 12/28/2017  Elsevier Interactive Patient Education  2019 [employer]vier Inc[employer]

## 2018-08-02 NOTE — Progress Notes (Signed)
Patient ID: [name], female    DOB: 10/14/1955  Age: 62 y.o. MRN: [id number]    Subjective:  Subjective  HPI Pamela LimJoyce K Roehm presents for sinus pressure x 10 days  She is using otc dayquil and nyquil with some relief + green mucus no fever   Review of Systems  Constitutional: Positive for chills. Negative for fever.  HENT: Positive for congestion, postnasal drip, rhinorrhea and sinus pressure.   Respiratory: Negative for cough, chest tightness, shortness of breath and wheezing.   Cardiovascular: Negative for chest pain, palpitations and leg swelling.  Allergic/Immunologic: Negative for environmental allergies.    History Past Medical History:  Diagnosis Date  . Allergic rhinitis   . Allergy     She has a past surgical history that includes Breast biopsy (11-2000); Vaginal hysterectomy; and Oophorectomy.   Her family history includes Breast cancer in her mother; Hypertension in her mother; Pancreatitis in her father; Stroke in her mother.She reports that she has never smoked. She has never used smokeless tobacco. She reports current alcohol use. She reports that she does not use drugs.  Current Outpatient Medications on File Prior to Visit  Medication Sig Dispense Refill  . Azelastine HCl 0.15 % SOLN INSTILL 2 SPRAYS IN EACH NOSTRIL DAILY. 30 mL 11  . Estriol POWD     . fluticasone (FLONASE) 50 MCG/ACT nasal spray PLACE 2 SPRAYS INTO THE NOSE DAILY. 16 g 11  . levocetirizine (XYZAL) 5 MG tablet Take 1 tablet (5 mg total) by mouth daily. 90 tablet 3   No current facility-administered medications on file prior to visit.      Objective:  Objective  Physical Exam Vitals signs and nursing note reviewed.  Constitutional:      Appearance: She is not diaphoretic.  HENT:     Nose: Mucosal edema and rhinorrhea present. No nasal deformity.     Right Sinus: Maxillary sinus tenderness and frontal sinus tenderness present.     Left Sinus: Maxillary sinus tenderness and frontal sinus  tenderness present.     Mouth/Throat:     Pharynx: No oropharyngeal exudate.  Neck:     Musculoskeletal: Normal range of motion and neck supple.  Cardiovascular:     Rate and Rhythm: Normal rate and regular rhythm.     Heart sounds: Normal heart sounds. No murmur.  Pulmonary:     Effort: Pulmonary effort is normal. No respiratory distress.     Breath sounds: Normal breath sounds. No rales.  Lymphadenopathy:     Cervical: No cervical adenopathy.  Skin:    General: Skin is warm.  Neurological:     Mental Status: She is alert and oriented to person, place, and time.    BP 116/66 (BP Location: Left Arm, Cuff Size: Normal)   Pulse 73   Temp 98.4 F (36.9 C) (Oral)   Resp 16   Ht 5\' 4"  (1.626 m)   Wt 163 lb 12.8 oz (74.3 kg)   SpO2 99%   BMI 28.12 kg/m  Wt Readings from Last 3 Encounters:  08/02/18 163 lb 12.8 oz (74.3 kg)  05/06/18 165 lb 9.6 oz (75.1 kg)  09/29/17 168 lb 9.6 oz (76.5 kg)     Lab Results  Component Value Date   WBC 4.4 05/06/2018   HGB 13.7 05/06/2018   HCT 40.5 05/06/2018   PLT 216.0 05/06/2018   GLUCOSE 92 05/06/2018   CHOL 155 05/06/2018   TRIG 50.0 05/06/2018   HDL 73.30 05/06/2018   LDLCALC  71 05/06/2018   ALT 17 05/06/2018   AST 20 05/06/2018   NA 138 05/06/2018   K 4.0 05/06/2018   CL 104 05/06/2018   CREATININE 0.75 05/06/2018   BUN 18 05/06/2018   CO2 28 05/06/2018   TSH 1.29 05/06/2018    No results found.   Assessment & Plan:  Plan  I am having Wylene MenJoyce K. Sholtz start on doxycycline. I am also having her maintain her Estriol, levocetirizine, fluticasone, and Azelastine HCl.  Meds ordered this encounter  Medications  . doxycycline (VIBRA-TABS) 100 MG tablet    Sig: Take 1 tablet (100 mg total) by mouth 2 (two) times daily.    Dispense:  20 tablet    Refill:  0    Problem List Items Addressed This Visit    None    Visit Diagnoses    Pansinusitis, unspecified chronicity    -  Primary   Relevant Medications   doxycycline  (VIBRA-TABS) 100 MG tablet    con't with saline spray, antihistamine con't otc cough med rto prn  Follow-up: Return if symptoms worsen or fail to improve.  Donato SchultzYvonne R Lowne Chase, DO

## 2018-08-09 ENCOUNTER — Ambulatory Visit: Payer: Self-pay

## 2018-08-09 NOTE — Telephone Encounter (Signed)
Pt c/o headache above the eyes and to the left side of face. Pt's pain is moderate.  Pt was prescribed doxycycline and is on day 8 of antibiotic. Pt complains of copious nasal drainage. Denies fever. Care advice given to pt and pt verbalized understanding. Appointment accepted for tomorrow morning with Esperanza RichtersEdward Saguier PA.  Reason for Disposition . [1] Taking antibiotic > 72 hours (3 days) AND [2] sinus pain not improved  Answer Assessment - Initial Assessment Questions 1. ANTIBIOTIC: "What antibiotic are you receiving?" "How many times per day?"     doxycycline 2. ONSET: "When was the antibiotic started?"     08/02/18 3. PAIN: "How bad is the sinus pain?"   (Scale 1-10; mild, moderate or severe)   - MILD (1-3): doesn't interfere with normal activities    - MODERATE (4-7): interferes with normal activities (e.g., work or school) or awakens from sleep   - SEVERE (8-10): excruciating pain and patient unable to do any normal activities        moderate 4. FEVER: "Do you have a fever?" If so, ask: "What is it, how was it measured, and when did it start?"      no 5. SYMPTOMS: "Are there any other symptoms you're concerned about?" If so, ask: "When did it start?"     Headache above eyes and left side of face 6. PREGNANCY: "Is there any chance you are pregnant?" "When was your last menstrual period?"     n/a  Protocols used: SINUS INFECTION ON ANTIBIOTIC FOLLOW-UP CALL-A-AH

## 2018-08-10 ENCOUNTER — Ambulatory Visit: Payer: BLUE CROSS/BLUE SHIELD | Admitting: Medical

## 2018-08-10 ENCOUNTER — Encounter: Payer: Self-pay | Admitting: Medical

## 2018-08-10 VITALS — BP 107/62 | HR 76 | Temp 98.2°F | Resp 16 | Ht 64.0 in | Wt 166.8 lb

## 2018-08-10 DIAGNOSIS — J324 Chronic pansinusitis: Secondary | ICD-10-CM | POA: Diagnosis not present

## 2018-08-10 MED ORDER — METHYLPREDNISOLONE ACETATE 40 MG/ML IJ SUSP
40.0000 mg | Freq: Once | INTRAMUSCULAR | Status: AC
  Administered 2018-08-10: 40 mg via INTRAMUSCULAR

## 2018-08-10 MED ORDER — LEVOFLOXACIN 500 MG PO TABS: 500.0000 mg | ORAL_TABLET | Freq: Every day | ORAL | 0 refills | Status: DC

## 2018-08-10 NOTE — Patient Instructions (Signed)
You do appear to have persisting sinus infection despite use of doxycycline.  History of allergy type symptoms and proceed sinus infections in the past.  Recommend switching to levofloxacin antibiotic.  Please make sure that you eat probiotic rich foods or get probiotic tablets over-the-counter.  We are giving Depo-Medrol 40 mg IM injection today.  Continue her Astelin.  Follow-up in 7 days office appointment or he could my chart as well.  Depending on how how you are doing might extend levofloxacin for full 10 days.  If sinus pressure persist despite second round of antibiotics/Levaquin then might need to consider referral to ENT or CT sinus imaging.

## 2018-08-10 NOTE — Progress Notes (Signed)
Subjective:    Patient ID: [name], female    DOB: 1956-06-26, 62 y.o.   MRN: [id number]  HPI  Pt in for evaluation.   Pt states she has been on antibiotic for about 9 days for sinus infection. She has hx of sinus infections in past. Usually after fall/into the winter. Pt has used doxycycline. Pt has allergies to pcn and sulfa.  Pt has been sneezing. At onset had colored mucus. Now less mucus when blows her nose.  No fever, no chills or sweats.  Pt describes improvement for first 3 days of antibiotic.  In am has pressure of frontal and maxillary sinus.    Review of Systems  Constitutional: Negative for chills, fatigue and fever.  HENT: Positive for congestion, postnasal drip, sinus pressure, sinus pain and sneezing. Negative for sore throat.   Respiratory: Negative for cough, chest tightness, shortness of breath and wheezing.   Cardiovascular: Negative for palpitations.  Gastrointestinal: Negative for abdominal pain.  Musculoskeletal: Negative for back pain.  Skin: Negative for rash.  Neurological: Negative for dizziness, speech difficulty and headaches.  Hematological: Negative for adenopathy. Does not bruise/bleed easily.  Psychiatric/Behavioral: Negative for behavioral problems and confusion. The patient is not nervous/anxious.      Past Medical History:  Diagnosis Date  . Allergic rhinitis   . Allergy      Social History   Socioeconomic History  . Marital status: Married    Spouse name: Not on file  . Number of children: 1  . Years of education: Not on file  . Highest education level: Not on file  Occupational History  . Occupation: International aid/development workerinvestment banking  Social Needs  . Financial resource strain: Not on file  . Food insecurity:    Worry: Not on file    Inability: Not on file  . Transportation needs:    Medical: Not on file    Non-medical: Not on file  Tobacco Use  . Smoking status: Never Smoker  . Smokeless tobacco: Never Used  Substance and  Sexual Activity  . Alcohol use: Yes    Comment: rare  . Drug use: No  . Sexual activity: Yes    Partners: Male  Lifestyle  . Physical activity:    Days per week: Not on file    Minutes per session: Not on file  . Stress: Not on file  Relationships  . Social connections:    Talks on phone: Not on file    Gets together: Not on file    Attends religious service: Not on file    Active member of club or organization: Not on file    Attends meetings of clubs or organizations: Not on file    Relationship status: Not on file  . Intimate partner violence:    Fear of current or ex partner: Not on file    Emotionally abused: Not on file    Physically abused: Not on file    Forced sexual activity: Not on file  Other Topics Concern  . Not on file  Social History Narrative   Exercise--no    Past Surgical History:  Procedure Laterality Date  . BREAST BIOPSY  11-2000   benign  . OOPHORECTOMY    . VAGINAL HYSTERECTOMY      Family History  Problem Relation Age of Onset  . Breast cancer Mother   . Hypertension Mother   . Stroke Mother   . Pancreatitis Father     Allergies  Allergen Reactions  .  Sulfonamide Derivatives Swelling  . Penicillins Rash    Current Outpatient Medications on File Prior to Visit  Medication Sig Dispense Refill  . Azelastine HCl 0.15 % SOLN INSTILL 2 SPRAYS IN EACH NOSTRIL DAILY. 30 mL 11  . doxycycline (VIBRA-TABS) 100 MG tablet Take 1 tablet (100 mg total) by mouth 2 (two) times daily. 20 tablet 0  . Estriol POWD     . fluticasone (FLONASE) 50 MCG/ACT nasal spray PLACE 2 SPRAYS INTO THE NOSE DAILY. 16 g 11  . levocetirizine (XYZAL) 5 MG tablet Take 1 tablet (5 mg total) by mouth daily. 90 tablet 3   No current facility-administered medications on file prior to visit.     There were no vitals taken for this visit.      Objective:   Physical Exam  General  Mental Status - Alert. General Appearance - Well groomed. Not in acute  distress.  Skin Rashes- No Rashes.  HEENT Head- Normal. Ear Auditory Canal - Left- Normal. Right - Normal.Tympanic Membrane- Left- Normal. Right- Normal. Eye Sclera/Conjunctiva- Left- Normal. Right- Normal. Nose & Sinuses Nasal Mucosa- Left-  Boggy and Congested. Right-  Boggy and  Congested.Bilateral maxillary and frontal sinus pressure. Mouth & Throat Lips: Upper Lip- Normal: no dryness, cracking, pallor, cyanosis, or vesicular eruption. Lower Lip-Normal: no dryness, cracking, pallor, cyanosis or vesicular eruption. Buccal Mucosa- Bilateral- No Aphthous ulcers. Oropharynx- No Discharge or Erythema. Tonsils: Characteristics- Bilateral- No Erythema or Congestion +pnd. . Size/Enlargement- Bilateral- No enlargement. Discharge- bilateral-None.  Neck Neck- Supple. No Masses.   Chest and Lung Exam Auscultation: Breath Sounds:-Clear even and unlabored.  Cardiovascular Auscultation:Rythm- Regular, rate and rhythm. Murmurs & Other Heart Sounds:Ausculatation of the heart reveal- No Murmurs.  Lymphatic Head & Neck General Head & Neck Lymphatics: Bilateral: Description- No Localized lymphadenopathy.       Assessment & Plan:  You do appear to have persisting sinus infection despite use of doxycycline.  History of allergy type symptoms and proceed sinus infections in the past.  Recommend switching to levofloxacin antibiotic.  Please make sure that you eat probiotic rich foods or get probiotic tablets over-the-counter.  We are giving Depo-Medrol 40 mg IM injection today.  Continue her Astelin.  Follow-up in 7 days office appointment or he could my chart as well.  Depending on how how you are doing might extend levofloxacin for full 10 days.  If sinus pressure persist despite second round of antibiotics/Levaquin then might need to consider referral to ENT or CT sinus imaging.  Esperanza RichtersEdward Jordani Nunn, PA-C

## 2018-08-10 NOTE — Addendum Note (Signed)
Addended by: Orlene OchRENCE, Kathey Simer N on: 08/10/2018 09:43 AM   Modules accepted: Orders

## 2018-08-16 ENCOUNTER — Encounter: Payer: Self-pay | Admitting: Medical

## 2018-09-22 DIAGNOSIS — H25011 Cortical age-related cataract, right eye: Secondary | ICD-10-CM | POA: Diagnosis not present

## 2018-09-22 DIAGNOSIS — H52203 Unspecified astigmatism, bilateral: Secondary | ICD-10-CM | POA: Diagnosis not present

## 2018-09-22 DIAGNOSIS — H5213 Myopia, bilateral: Secondary | ICD-10-CM | POA: Diagnosis not present

## 2018-10-05 DIAGNOSIS — D1801 Hemangioma of skin and subcutaneous tissue: Secondary | ICD-10-CM | POA: Diagnosis not present

## 2018-10-05 DIAGNOSIS — L821 Other seborrheic keratosis: Secondary | ICD-10-CM | POA: Diagnosis not present

## 2018-10-05 DIAGNOSIS — D2271 Melanocytic nevi of right lower limb, including hip: Secondary | ICD-10-CM | POA: Diagnosis not present

## 2018-10-05 DIAGNOSIS — D485 Neoplasm of uncertain behavior of skin: Secondary | ICD-10-CM | POA: Diagnosis not present

## 2018-10-05 DIAGNOSIS — I788 Other diseases of capillaries: Secondary | ICD-10-CM | POA: Diagnosis not present

## 2018-10-05 DIAGNOSIS — D225 Melanocytic nevi of trunk: Secondary | ICD-10-CM | POA: Diagnosis not present

## 2018-10-12 ENCOUNTER — Telehealth: Payer: Self-pay | Admitting: *Deleted

## 2018-10-12 NOTE — Telephone Encounter (Signed)
Received Dermatopathology Report results from [facility]; forwarded to provider/SLS 03/03     

## 2019-01-04 DIAGNOSIS — Z01419 Encounter for gynecological examination (general) (routine) without abnormal findings: Secondary | ICD-10-CM | POA: Diagnosis not present

## 2019-01-04 DIAGNOSIS — Z6828 Body mass index (BMI) 28.0-28.9, adult: Secondary | ICD-10-CM | POA: Diagnosis not present

## 2019-01-04 DIAGNOSIS — Z1231 Encounter for screening mammogram for malignant neoplasm of breast: Secondary | ICD-10-CM | POA: Diagnosis not present

## 2019-04-26 ENCOUNTER — Telehealth: Payer: Self-pay | Admitting: Family Medicine

## 2019-04-26 NOTE — Telephone Encounter (Signed)
Needs appt please

## 2019-04-26 NOTE — Telephone Encounter (Signed)
°  Relation to pt: self  Call back number: [phone]  Pharmacy:  [facility], [place] [phone] (Phone) [phone] (Fax)    Reason for call:  Patient grand daughter was dx  with pinworm and was advised to contact PCP and request Rx due to pre caution, patient currently not experiencing any symptomS please advise.

## 2019-04-27 ENCOUNTER — Other Ambulatory Visit: Payer: Self-pay

## 2019-04-27 ENCOUNTER — Ambulatory Visit (INDEPENDENT_AMBULATORY_CARE_PROVIDER_SITE_OTHER): Payer: BC Managed Care – PPO | Admitting: Medical

## 2019-04-27 ENCOUNTER — Encounter: Payer: Self-pay | Admitting: Medical

## 2019-04-27 DIAGNOSIS — Z831 Family history of other infectious and parasitic diseases: Secondary | ICD-10-CM

## 2019-04-27 MED ORDER — EMVERM 100 MG PO CHEW: CHEWABLE_TABLET | ORAL | 0 refills | Status: DC

## 2019-04-27 NOTE — Progress Notes (Signed)
   Subjective:    Patient ID: [name], female    DOB: June 19, 1956, 63 y.o.   MRN: [id number]  HPI Virtual Visit via Telephone Note  I connected with [name] on 04/27/19 at  3:20 PM EDT by telephone and verified that I am speaking with the correct person using two identifiers.  Location: Patient: home Provider: office.   I discussed the limitations, risks, security and privacy concerns of performing an evaluation and management service by telephone and the availability of in person appointments. I also discussed with the patient that there may be a patient responsible charge related to this service. The patient expressed understanding and agreed to proceed.   Follow Up Instructions: With family history of recent pinworm infection in grandaughter, will rx emverm to take one tab today and repeat in 2 weeks.   Discussed potential cost and if not covered otc Reeses option. But hopefully prescription covered by insurance.  Follow up as regularly scheduled with pcp or as needed.   I discussed the assessment and treatment plan with the patient. The patient was provided an opportunity to ask questions and all were answered. The patient agreed with the plan and demonstrated an understanding of the instructions.   The patient was advised to call back or seek an in-person evaluation if the symptoms worsen or if the condition fails to improve as anticipated.  I provided 15  minutes of non-face-to-face time during this encounter.   Mackie Pai, PA-C  Pt grandaughter has pinworm about one month ago. Was treated about one moth ago and treated with otc meds. Recently recurrent infection and give presciption.  Pt pediatrician states/advised family member be treated preventatively. Pt/grandom  has no symptoms.       Review of Systems  Constitutional: Negative for chills, fatigue and fever.  Respiratory: Negative for cough, chest tightness, shortness of breath and wheezing.    Cardiovascular: Negative for chest pain and palpitations.  Gastrointestinal: Negative for abdominal distention, anal bleeding, constipation, diarrhea, nausea, rectal pain and vomiting.       No rectal itch.       Assessment & Plan:

## 2019-04-27 NOTE — Patient Instructions (Addendum)
With family history of recent pinworm infection in grandaughter, will rx emverm to take one tab today and repeat in 2 weeks.   Discussed potential cost and if not covered otc Reeses option. But hopefully prescription covered by insurance.  Follow up as regularly scheduled with pcp or as needed.

## 2019-05-06 ENCOUNTER — Other Ambulatory Visit: Payer: Self-pay

## 2019-05-09 ENCOUNTER — Ambulatory Visit (INDEPENDENT_AMBULATORY_CARE_PROVIDER_SITE_OTHER): Payer: BC Managed Care – PPO | Admitting: Family Medicine

## 2019-05-09 ENCOUNTER — Other Ambulatory Visit: Payer: Self-pay

## 2019-05-09 ENCOUNTER — Encounter: Payer: Self-pay | Admitting: Family Medicine

## 2019-05-09 VITALS — BP 100/60 | HR 74 | Temp 97.0°F | Resp 18 | Ht 64.0 in | Wt 170.6 lb

## 2019-05-09 DIAGNOSIS — Z0000 Encounter for general adult medical examination without abnormal findings: Secondary | ICD-10-CM | POA: Diagnosis not present

## 2019-05-09 DIAGNOSIS — Z23 Encounter for immunization: Secondary | ICD-10-CM

## 2019-05-09 DIAGNOSIS — J301 Allergic rhinitis due to pollen: Secondary | ICD-10-CM

## 2019-05-09 DIAGNOSIS — Z136 Encounter for screening for cardiovascular disorders: Secondary | ICD-10-CM

## 2019-05-09 LAB — LIPID PANEL
Cholesterol: 153 mg/dL (ref 0–200)
HDL: 71.3 mg/dL (ref >39.00)
LDL Cholesterol: 75 mg/dL (ref 0–99)
NonHDL: 81.82
Total CHOL/HDL Ratio: 2
Triglycerides: 35 mg/dL (ref 0.0–149.0)
VLDL: 7 mg/dL (ref 0.0–40.0)

## 2019-05-09 LAB — CBC WITH DIFFERENTIAL/PLATELET
Basophils Absolute: 0 10*3/uL (ref 0.0–0.1)
Basophils Relative: 0.5 % (ref 0.0–3.0)
Eosinophils Absolute: 0.1 10*3/uL (ref 0.0–0.7)
Eosinophils Relative: 3.3 % (ref 0.0–5.0)
HCT: 39.8 % (ref 36.0–46.0)
Hemoglobin: 13.5 g/dL (ref 12.0–15.0)
Lymphocytes Relative: 47.9 % — ABNORMAL HIGH (ref 12.0–46.0)
Lymphs Abs: 1.4 10*3/uL (ref 0.7–4.0)
MCHC: 33.9 g/dL (ref 30.0–36.0)
MCV: 95.6 fl (ref 78.0–100.0)
Monocytes Absolute: 0.4 10*3/uL (ref 0.1–1.0)
Monocytes Relative: 12.3 % — ABNORMAL HIGH (ref 3.0–12.0)
Neutro Abs: 1.1 10*3/uL — ABNORMAL LOW (ref 1.4–7.7)
Neutrophils Relative %: 36 % — ABNORMAL LOW (ref 43.0–77.0)
Platelets: 217 10*3/uL (ref 150.0–400.0)
RBC: 4.16 Mil/uL (ref 3.87–5.11)
RDW: 13.3 % (ref 11.5–15.5)
WBC: 2.9 10*3/uL — ABNORMAL LOW (ref 4.0–10.5)

## 2019-05-09 LAB — COMPREHENSIVE METABOLIC PANEL
ALT: 16 U/L (ref 0–35)
AST: 19 U/L (ref 0–37)
Albumin: 3.7 g/dL (ref 3.5–5.2)
Alkaline Phosphatase: 46 U/L (ref 39–117)
BUN: 16 mg/dL (ref 6–23)
CO2: 30 mEq/L (ref 19–32)
Calcium: 8.5 mg/dL (ref 8.4–10.5)
Chloride: 104 mEq/L (ref 96–112)
Creatinine, Ser: 0.75 mg/dL (ref 0.40–1.20)
GFR: 78.15 mL/min (ref >60.00)
Glucose, Bld: 85 mg/dL (ref 70–99)
Potassium: 4.2 mEq/L (ref 3.5–5.1)
Sodium: 139 mEq/L (ref 135–145)
Total Bilirubin: 0.6 mg/dL (ref 0.2–1.2)
Total Protein: 6.2 g/dL (ref 6.0–8.3)

## 2019-05-09 LAB — TSH: TSH: 1.78 u[IU]/mL (ref 0.35–4.50)

## 2019-05-09 MED ORDER — LEVOCETIRIZINE DIHYDROCHLORIDE 5 MG PO TABS: 5.0000 mg | ORAL_TABLET | Freq: Every day | ORAL | 3 refills | Status: DC

## 2019-05-09 MED ORDER — AZELASTINE HCL 0.15 % NA SOLN: NASAL | 11 refills | Status: DC

## 2019-05-09 MED ORDER — FLUTICASONE PROPIONATE 50 MCG/ACT NA SUSP: NASAL | 11 refills | Status: DC

## 2019-05-09 NOTE — Progress Notes (Signed)
Subjective:     [name] is a 63 y.o. female and is here for a comprehensive physical exam. The patient reports no problems.  Social History   Socioeconomic History  . Marital status: Married    Spouse name: Not on file  . Number of children: 1  . Years of education: Not on file  . Highest education level: Not on file  Occupational History  . Occupation: International aid/development worker  Social Needs  . Financial resource strain: Not on file  . Food insecurity    Worry: Not on file    Inability: Not on file  . Transportation needs    Medical: Not on file    Non-medical: Not on file  Tobacco Use  . Smoking status: Never Smoker  . Smokeless tobacco: Never Used  Substance and Sexual Activity  . Alcohol use: Yes    Comment: rare  . Drug use: No  . Sexual activity: Yes    Partners: Male  Lifestyle  . Physical activity    Days per week: Not on file    Minutes per session: Not on file  . Stress: Not on file  Relationships  . Social Musician on phone: Not on file    Gets together: Not on file    Attends religious service: Not on file    Active member of club or organization: Not on file    Attends meetings of clubs or organizations: Not on file    Relationship status: Not on file  . Intimate partner violence    Fear of current or ex partner: Not on file    Emotionally abused: Not on file    Physically abused: Not on file    Forced sexual activity: Not on file  Other Topics Concern  . Not on file  Social History Narrative   Exercise--no   Health Maintenance  Topic Date Due  . MAMMOGRAM  01/02/2019  . HIV Screening  05/06/2024 (Originally 10/[birth year]/1971)  . PAP SMEAR-Modifier  01/01/2021  . COLONOSCOPY  11/18/2022  . TETANUS/TDAP  04/03/2027  . INFLUENZA VACCINE  Completed  . Hepatitis C Screening  Completed    The following portions of the patient's history were reviewed and updated as appropriate:  She  has a past medical history of Allergic rhinitis and  Allergy. She does not have any pertinent problems on file. She  has a past surgical history that includes Breast biopsy (11-2000); Vaginal hysterectomy; and Oophorectomy. Her family history includes Breast cancer in her mother; Hypertension in her mother; Pancreatitis in her father; Stroke in her mother. She  reports that she has never smoked. She has never used smokeless tobacco. She reports current alcohol use. She reports that she does not use drugs. She has a current medication list which includes the following prescription(s): azelastine hcl, estriol, fluticasone, levocetirizine, and emverm. Current Outpatient Medications on File Prior to Visit  Medication Sig Dispense Refill  . Estriol POWD     . mebendazole (EMVERM) 100 MG chewable tablet 1 tab po now and repeat in 2 weeks. 2 tablet 0   No current facility-administered medications on file prior to visit.    She is allergic to sulfonamide derivatives and penicillins..  Review of Systems Review of Systems  Constitutional: Negative for activity change, appetite change and fatigue.  HENT: Negative for hearing loss, congestion, tinnitus and ear discharge.  dentist q40m Eyes: Negative for visual disturbance (see optho q1y -- vision corrected to 20/20 with glasses).  Respiratory: Negative for cough, chest tightness and shortness of breath.   Cardiovascular: Negative for chest pain, palpitations and leg swelling.  Gastrointestinal: Negative for abdominal pain, diarrhea, constipation and abdominal distention.  Genitourinary: Negative for urgency, frequency, decreased urine volume and difficulty urinating.  Musculoskeletal: Negative for back pain, arthralgias and gait problem.  Skin: Negative for color change, pallor and rash.  Neurological: Negative for dizziness, light-headedness, numbness and headaches.  Hematological: Negative for adenopathy. Does not bruise/bleed easily.  Psychiatric/Behavioral: Negative for suicidal ideas, confusion,  sleep disturbance, self-injury, dysphoric mood, decreased concentration and agitation.       Objective:    BP 100/60 (BP Location: Right Arm, Patient Position: Sitting, Cuff Size: Normal)   Pulse 74   Temp (!) 97 F (36.1 C) (Temporal)   Resp 18   Ht 5\' 4"  (1.626 m)   Wt 170 lb 9.6 oz (77.4 kg)   SpO2 97%   BMI 29.28 kg/m  General appearance: alert, cooperative, appears stated age and no distress Head: Normocephalic, without obvious abnormality, atraumatic Eyes: conjunctivae/corneas clear. PERRL, EOM's intact. Fundi benign. Ears: normal TM's and external ear canals both ears Nose: Nares normal. Septum midline. Mucosa normal. No drainage or sinus tenderness. Throat: lips, mucosa, and tongue normal; teeth and gums normal Neck: no adenopathy, no carotid bruit, no JVD, supple, symmetrical, trachea midline and thyroid not enlarged, symmetric, no tenderness/mass/nodules Back: symmetric, no curvature. ROM normal. No CVA tenderness. Lungs: clear to auscultation bilaterally Breasts: normal appearance, no masses or tenderness Heart: regular rate and rhythm, S1, S2 normal, no murmur, click, rub or gallop Abdomen: soft, non-tender; bowel sounds normal; no masses,  no organomegaly Pelvic: not indicated; status post hysterectomy, negative ROS Extremities: extremities normal, atraumatic, no cyanosis or edema Pulses: 2+ and symmetric Skin: Skin color, texture, turgor normal. No rashes or lesions Lymph nodes: Cervical, supraclavicular, and axillary nodes normal. Neurologic: Alert and oriented X 3, normal strength and tone. Normal symmetric reflexes. Normal coordination and gait    Assessment:    Healthy female exam.      Plan:    ghm utd Check labs  See After Visit Summary for Counseling Recommendations

## 2019-05-09 NOTE — Patient Instructions (Signed)

## 2019-05-11 ENCOUNTER — Other Ambulatory Visit: Payer: Self-pay

## 2019-05-11 DIAGNOSIS — D729 Disorder of white blood cells, unspecified: Secondary | ICD-10-CM

## 2019-05-25 DIAGNOSIS — N951 Menopausal and female climacteric states: Secondary | ICD-10-CM | POA: Diagnosis not present

## 2019-05-30 ENCOUNTER — Other Ambulatory Visit: Payer: Self-pay | Admitting: Hematology

## 2019-05-30 DIAGNOSIS — D72819 Decreased white blood cell count, unspecified: Secondary | ICD-10-CM | POA: Insufficient documentation

## 2019-05-30 DIAGNOSIS — D709 Neutropenia, unspecified: Secondary | ICD-10-CM

## 2019-05-30 NOTE — Progress Notes (Signed)
[facility] CONSULT NOTE  Patient Care Team: Zola ButtonLowne Chase, Grayling CongressYvonne R, DO as PCP - Vern ClaudeGeneral Grewal, Michelle, MD as Consulting Physician (Obstetrics and Gynecology)  HEME/ONC OVERVIEW: 1. New onset leukopenia with mild neutropenia -Baseline WBC between 3-4k since 2015 -WBC 2.9k with ANC ~1100 in 04/2019  ASSESSMENT & PLAN:   New onset leukopenia with mild neutropenia -I reviewed the patient's records in detail, including PCP clinic notes and lab studies -In summary, patient presented to his PCP in late 04/2019 after his granddaughter was diagnosed with pinworm infection, and the patient was prescribed mebendazole.  No CBC was checked at the time of prescription.  She will return to clinic 2 weeks later for follow-up, and a CBC at that time showed mild leukopenia (WBC 2.9k w/ ANC ~1100).  Review of CBC dating back to 2015 showed WBC between 3 and 4k.  Patient was referred to hematology for further evaluation. -I discussed the lab results in detail with the patient -WBC 3.5 today w/ ANC ~1600, improving  -Clinically, patient denies any constitutional symptoms, symptoms of infection, or recent medication change (other than mebendazole) -I personally reviewed the patient's peripheral blood smear today.  The red blood cells were of normal morphology.  There was no schistocytosis.  The white blood cells were of normal morphology. There were no peripheral circulating blasts. The platelets were of normal size and I verified that there were no platelet clumping. -I reviewed with the patient with differential diagnoses for leukopenia, including medications, infection, rheumatologic disorders, and less commonly, malignancy. -In light of the new onset leukopenia, I have ordered infectious studies, including HIV, Hepatitis B/C serologies, as well as copper level -Given that the leukopenia is new after the patient recently took mebendazole, which can cause leukopenia, it may be due to medication  side effect -In addition, review of her CBC dating back to 2015 showed periodic mild leukopenia (WBC 3-4k), suggesting benign etiology  -I counseled the patient on some of the concerning symptoms, including unexplained persistent fever, night sweats, weight loss, lymphadenopathy, for which she should contact clinic for further evaluation -I also educated patient on importance of keeping up-to-date with age-appropriate cancer screening -We will repeat her labs in 6 months; if leukopenia is stable or improving, then no further work-up is indicated   Orders Placed This Encounter  Procedures  . CBC with Differential (Cancer Center Only)    Standing Status:   Future    Standing Expiration Date:   07/04/2020  . CMP (Cancer Center only)    Standing Status:   Future    Standing Expiration Date:   07/04/2020  . Save Smear ([facility])    Standing Status:   Future    Standing Expiration Date:   05/30/2020  . Lactate dehydrogenase    Standing Status:   Future    Standing Expiration Date:   07/04/2020   All questions were answered. The patient knows to call the clinic with any problems, questions or concerns.  Return in 6 months for labs and clinic follow-up.  Arthur HolmsYan Litisha Guagliardo, MD 05/31/2019 12:09 PM   CHIEF COMPLAINTS/PURPOSE OF CONSULTATION:  "I am doing fine"  HISTORY OF PRESENTING ILLNESS:  Jeanice LimJoyce K Tine 63 y.o. female is here because of new leukopenia with mild neutropenia.  Patient reports that her granddaughter had recently been diagnosed with pinworm, and as result, she presented to her PCP for routine follow-up and possible pinworm prophylaxis, given her close contact with her granddaughter.  She was prescribed mebendazole.  At the time of her follow-up visit 2 weeks later, CBC showed mild leukopenia (WBC 2.9k with ANC ~1100).  She was referred to hematology for further evaluation.  Patient reports that she feels well today, and denies any constitutional symptoms, such as unexplained fever, night  sweats, weight loss, or lymphadenopathy.  She denies any recent medication changes other than taking mebendazole.  She has a history of seasonal allergy, which primarily manifests as sinus congestion, but she denies any any symptoms of sinus infection, such as green discharge or sinus pain.  She denies any other complaint today.  She is up-to-date with her cancer screening, including mammogram, colonoscopy, and the pelvic exam.  REVIEW OF SYSTEMS:   Constitutional: ( - ) fevers, ( - )  chills , ( - ) night sweats Eyes: ( - ) blurriness of vision, ( - ) double vision, ( - ) watery eyes Ears, nose, mouth, throat, and face: ( - ) mucositis, ( - ) sore throat Respiratory: ( - ) cough, ( - ) dyspnea, ( - ) wheezes Cardiovascular: ( - ) palpitation, ( - ) chest discomfort, ( - ) lower extremity swelling Gastrointestinal:  ( - ) nausea, ( - ) heartburn, ( - ) change in bowel habits Skin: ( - ) abnormal skin rashes Lymphatics: ( - ) new lymphadenopathy, ( - ) easy bruising Neurological: ( - ) numbness, ( - ) tingling, ( - ) new weaknesses Behavioral/Psych: ( - ) mood change, ( - ) new changes  All other systems were reviewed with the patient and are negative.  I have reviewed her chart and materials related to her cancer extensively and collaborated history with the patient. Summary of oncologic history is as follows: Oncology History   No history exists.    MEDICAL HISTORY:  Past Medical History:  Diagnosis Date  . Allergic rhinitis   . Allergy     SURGICAL HISTORY: Past Surgical History:  Procedure Laterality Date  . BREAST BIOPSY  11-2000   benign  . OOPHORECTOMY    . VAGINAL HYSTERECTOMY      SOCIAL HISTORY: Social History   Socioeconomic History  . Marital status: Married    Spouse name: Not on file  . Number of children: 1  . Years of education: Not on file  . Highest education level: Not on file  Occupational History  . Occupation: International aid/development worker  Social Needs  .  Financial resource strain: Not on file  . Food insecurity    Worry: Not on file    Inability: Not on file  . Transportation needs    Medical: Not on file    Non-medical: Not on file  Tobacco Use  . Smoking status: Never Smoker  . Smokeless tobacco: Never Used  Substance and Sexual Activity  . Alcohol use: Yes    Comment: rare  . Drug use: No  . Sexual activity: Yes    Partners: Male  Lifestyle  . Physical activity    Days per week: Not on file    Minutes per session: Not on file  . Stress: Not on file  Relationships  . Social Musician on phone: Not on file    Gets together: Not on file    Attends religious service: Not on file    Active member of club or organization: Not on file    Attends meetings of clubs or organizations: Not on file    Relationship status: Not on file  . Intimate  partner violence    Fear of current or ex partner: Not on file    Emotionally abused: Not on file    Physically abused: Not on file    Forced sexual activity: Not on file  Other Topics Concern  . Not on file  Social History Narrative   Exercise--no    FAMILY HISTORY: Family History  Problem Relation Age of Onset  . Breast cancer Mother   . Hypertension Mother   . Stroke Mother   . Pancreatitis Father     ALLERGIES:  is allergic to sulfonamide derivatives and penicillins.  MEDICATIONS:  Current Outpatient Medications  Medication Sig Dispense Refill  . Azelastine HCl 0.15 % SOLN INSTILL 2 SPRAYS IN EACH NOSTRIL DAILY. 30 mL 11  . Estriol POWD     . fluticasone (FLONASE) 50 MCG/ACT nasal spray PLACE 2 SPRAYS INTO THE NOSE DAILY. 16 g 11  . levocetirizine (XYZAL) 5 MG tablet Take 1 tablet (5 mg total) by mouth daily. 90 tablet 3  . mebendazole (EMVERM) 100 MG chewable tablet 1 tab po now and repeat in 2 weeks. 2 tablet 0   No current facility-administered medications for this visit.     PHYSICAL EXAMINATION: ECOG PERFORMANCE STATUS: 1 - Symptomatic but completely  ambulatory  Vitals:   05/31/19 1145  BP: 123/66  Pulse: 72  Resp: 18  Temp: (!) 97.5 F (36.4 C)  SpO2: 100%   Filed Weights   05/31/19 1145  Weight: 172 lb (78 kg)    GENERAL: alert, no distress and comfortable SKIN: skin color, texture, turgor are normal, no rashes or significant lesions EYES: conjunctiva are pink and non-injected, sclera clear OROPHARYNX: no exudate, no erythema; lips, buccal mucosa, and tongue normal  NECK: supple, non-tender LYMPH:  no palpable lymphadenopathy in the cervical LUNGS: clear to auscultation with normal breathing effort HEART: regular rate & rhythm, no murmurs, no lower extremity edema ABDOMEN: soft, non-tender, non-distended, normal bowel sounds Musculoskeletal: no cyanosis of digits and no clubbing  PSYCH: alert & oriented x 3, fluent speech NEURO: no focal motor/sensory deficits  LABORATORY DATA:  I have reviewed the data as listed Lab Results  Component Value Date   WBC 3.5 (L) 05/31/2019   HGB 13.5 05/31/2019   HCT 40.5 05/31/2019   MCV 95.7 05/31/2019   PLT 220 05/31/2019   Lab Results  Component Value Date   NA 139 05/09/2019   K 4.2 05/09/2019   CL 104 05/09/2019   CO2 30 05/09/2019    RADIOGRAPHIC STUDIES: I have personally reviewed the radiological images as listed and agreed with the findings in the report. No results found.  PATHOLOGY: I have reviewed the pathology reports as documented in the oncologist history.

## 2019-05-31 ENCOUNTER — Encounter: Payer: Self-pay | Admitting: Hematology

## 2019-05-31 ENCOUNTER — Other Ambulatory Visit: Payer: Self-pay

## 2019-05-31 ENCOUNTER — Telehealth: Payer: Self-pay | Admitting: Hematology

## 2019-05-31 ENCOUNTER — Inpatient Hospital Stay: Payer: BC Managed Care – PPO | Attending: Hematology

## 2019-05-31 ENCOUNTER — Inpatient Hospital Stay (HOSPITAL_BASED_OUTPATIENT_CLINIC_OR_DEPARTMENT_OTHER): Payer: BC Managed Care – PPO | Admitting: Hematology

## 2019-05-31 VITALS — BP 123/66 | HR 72 | Temp 97.5°F | Resp 18 | Ht 64.0 in | Wt 172.0 lb

## 2019-05-31 DIAGNOSIS — D709 Neutropenia, unspecified: Secondary | ICD-10-CM

## 2019-05-31 DIAGNOSIS — D72819 Decreased white blood cell count, unspecified: Secondary | ICD-10-CM | POA: Diagnosis not present

## 2019-05-31 LAB — CBC WITH DIFFERENTIAL (CANCER CENTER ONLY)
Abs Immature Granulocytes: 0.01 10*3/uL (ref 0.00–0.07)
Basophils Absolute: 0 10*3/uL (ref 0.0–0.1)
Basophils Relative: 1 %
Eosinophils Absolute: 0.1 10*3/uL (ref 0.0–0.5)
Eosinophils Relative: 2 %
HCT: 40.5 % (ref 36.0–46.0)
Hemoglobin: 13.5 g/dL (ref 12.0–15.0)
Immature Granulocytes: 0 %
Lymphocytes Relative: 40 %
Lymphs Abs: 1.4 10*3/uL (ref 0.7–4.0)
MCH: 31.9 pg (ref 26.0–34.0)
MCHC: 33.3 g/dL (ref 30.0–36.0)
MCV: 95.7 fL (ref 80.0–100.0)
Monocytes Absolute: 0.4 10*3/uL (ref 0.1–1.0)
Monocytes Relative: 11 %
Neutro Abs: 1.6 10*3/uL — ABNORMAL LOW (ref 1.7–7.7)
Neutrophils Relative %: 46 %
Platelet Count: 220 10*3/uL (ref 150–400)
RBC: 4.23 MIL/uL (ref 3.87–5.11)
RDW: 13 % (ref 11.5–15.5)
WBC Count: 3.5 10*3/uL — ABNORMAL LOW (ref 4.0–10.5)
nRBC: 0 % (ref 0.0–0.2)

## 2019-05-31 LAB — CMP (CANCER CENTER ONLY)
ALT: 19 U/L (ref 0–44)
AST: 19 U/L (ref 15–41)
Albumin: 4 g/dL (ref 3.5–5.0)
Alkaline Phosphatase: 49 U/L (ref 38–126)
Anion gap: 4 — ABNORMAL LOW (ref 5–15)
BUN: 18 mg/dL (ref 8–23)
CO2: 29 mmol/L (ref 22–32)
Calcium: 8.7 mg/dL — ABNORMAL LOW (ref 8.9–10.3)
Chloride: 104 mmol/L (ref 98–111)
Creatinine: 0.74 mg/dL (ref 0.44–1.00)
GFR, Est AFR Am: >60 mL/min (ref >60)
GFR, Estimated: >60 mL/min (ref >60)
Glucose, Bld: 96 mg/dL (ref 70–99)
Potassium: 4.2 mmol/L (ref 3.5–5.1)
Sodium: 137 mmol/L (ref 135–145)
Total Bilirubin: 0.5 mg/dL (ref 0.3–1.2)
Total Protein: 6.4 g/dL — ABNORMAL LOW (ref 6.5–8.1)

## 2019-05-31 LAB — HEPATITIS B SURFACE ANTIGEN: Hepatitis B Surface Ag: NONREACTIVE

## 2019-05-31 LAB — LACTATE DEHYDROGENASE: LDH: 165 U/L (ref 98–192)

## 2019-05-31 LAB — SAVE SMEAR(SSMR), FOR PROVIDER SLIDE REVIEW

## 2019-05-31 LAB — HIV ANTIBODY (ROUTINE TESTING W REFLEX): HIV Screen 4th Generation wRfx: NONREACTIVE

## 2019-05-31 LAB — HEPATITIS B SURFACE ANTIBODY,QUALITATIVE: Hep B S Ab: NONREACTIVE

## 2019-05-31 NOTE — Telephone Encounter (Signed)
Spoke with patient to confirm 6 month appt 11/29/19 at 1030 am per 10/20 LOS

## 2019-06-01 LAB — HCV COMMENT:

## 2019-06-01 LAB — HEPATITIS C ANTIBODY (REFLEX): HCV Ab: <0.1 s/co ratio (ref 0.0–0.9)

## 2019-06-03 LAB — COPPER, SERUM: Copper: 138 ug/dL (ref 72–166)

## 2019-06-13 ENCOUNTER — Telehealth: Payer: Self-pay | Admitting: *Deleted

## 2019-06-13 NOTE — Telephone Encounter (Addendum)
-----   Message from Tish Men, MD sent at 06/03/2019  7:29 AM EDT ----- Called Ms. Vaughan to let her know that her nutritional and infectious studies, including copper, HIV, Hep B/C, was all fine per Dr Maylon Peppers request.  We will plan to see her in 6 months as scheduled.  ----- Message ----- From: Buel Ream, [facility] [place] Sent: 05/31/2019  11:50 AM EDT To: Tish Men, MD

## 2019-08-17 ENCOUNTER — Encounter: Payer: Self-pay | Admitting: Family Medicine

## 2019-08-17 NOTE — Telephone Encounter (Signed)
We can do the labs but if they are abnormal this time she will needs to see Dr Dion Body.

## 2019-09-27 DIAGNOSIS — H52203 Unspecified astigmatism, bilateral: Secondary | ICD-10-CM | POA: Diagnosis not present

## 2019-09-27 DIAGNOSIS — H25011 Cortical age-related cataract, right eye: Secondary | ICD-10-CM | POA: Diagnosis not present

## 2019-09-27 DIAGNOSIS — H5213 Myopia, bilateral: Secondary | ICD-10-CM | POA: Diagnosis not present

## 2019-11-02 DIAGNOSIS — Z23 Encounter for immunization: Secondary | ICD-10-CM | POA: Diagnosis not present

## 2019-11-22 DIAGNOSIS — L820 Inflamed seborrheic keratosis: Secondary | ICD-10-CM | POA: Diagnosis not present

## 2019-11-22 DIAGNOSIS — D2271 Melanocytic nevi of right lower limb, including hip: Secondary | ICD-10-CM | POA: Diagnosis not present

## 2019-11-22 DIAGNOSIS — D2272 Melanocytic nevi of left lower limb, including hip: Secondary | ICD-10-CM | POA: Diagnosis not present

## 2019-11-22 DIAGNOSIS — D485 Neoplasm of uncertain behavior of skin: Secondary | ICD-10-CM | POA: Diagnosis not present

## 2019-11-22 DIAGNOSIS — D2261 Melanocytic nevi of right upper limb, including shoulder: Secondary | ICD-10-CM | POA: Diagnosis not present

## 2019-11-22 DIAGNOSIS — D1801 Hemangioma of skin and subcutaneous tissue: Secondary | ICD-10-CM | POA: Diagnosis not present

## 2019-11-22 DIAGNOSIS — L821 Other seborrheic keratosis: Secondary | ICD-10-CM | POA: Diagnosis not present

## 2019-11-28 ENCOUNTER — Telehealth: Payer: Self-pay | Admitting: *Deleted

## 2019-11-28 NOTE — Telephone Encounter (Signed)
Returned call to pt. " I had the covid vaccine #2 today. I just wanted to make MD aware as I have labs tomorrow." Thanked pt for the update. No further concerns. Message given to MD

## 2019-11-29 ENCOUNTER — Telehealth: Payer: Self-pay | Admitting: Hematology

## 2019-11-29 ENCOUNTER — Inpatient Hospital Stay: Payer: BLUE CROSS/BLUE SHIELD | Attending: Hematology | Admitting: Hematology

## 2019-11-29 ENCOUNTER — Other Ambulatory Visit: Payer: Self-pay

## 2019-11-29 ENCOUNTER — Inpatient Hospital Stay: Payer: BLUE CROSS/BLUE SHIELD

## 2019-11-29 ENCOUNTER — Encounter: Payer: Self-pay | Admitting: Hematology

## 2019-11-29 VITALS — BP 118/58 | HR 82 | Temp 97.3°F | Resp 18 | Ht 64.0 in | Wt 161.0 lb

## 2019-11-29 DIAGNOSIS — D709 Neutropenia, unspecified: Secondary | ICD-10-CM | POA: Insufficient documentation

## 2019-11-29 LAB — CMP (CANCER CENTER ONLY)
ALT: 19 U/L (ref 0–44)
AST: 24 U/L (ref 15–41)
Albumin: 4 g/dL (ref 3.5–5.0)
Alkaline Phosphatase: 54 U/L (ref 38–126)
Anion gap: 5 (ref 5–15)
BUN: 15 mg/dL (ref 8–23)
CO2: 27 mmol/L (ref 22–32)
Calcium: 9 mg/dL (ref 8.9–10.3)
Chloride: 107 mmol/L (ref 98–111)
Creatinine: 0.71 mg/dL (ref 0.44–1.00)
GFR, Est AFR Am: >60 mL/min (ref >60)
GFR, Estimated: >60 mL/min (ref >60)
Glucose, Bld: 103 mg/dL — ABNORMAL HIGH (ref 70–99)
Potassium: 4.2 mmol/L (ref 3.5–5.1)
Sodium: 139 mmol/L (ref 135–145)
Total Bilirubin: 0.6 mg/dL (ref 0.3–1.2)
Total Protein: 6.7 g/dL (ref 6.5–8.1)

## 2019-11-29 LAB — CBC WITH DIFFERENTIAL (CANCER CENTER ONLY)
Abs Immature Granulocytes: 0.01 10*3/uL (ref 0.00–0.07)
Basophils Absolute: 0 10*3/uL (ref 0.0–0.1)
Basophils Relative: 0 %
Eosinophils Absolute: 0.1 10*3/uL (ref 0.0–0.5)
Eosinophils Relative: 1 %
HCT: 40 % (ref 36.0–46.0)
Hemoglobin: 13.4 g/dL (ref 12.0–15.0)
Immature Granulocytes: 0 %
Lymphocytes Relative: 17 %
Lymphs Abs: 0.7 10*3/uL (ref 0.7–4.0)
MCH: 31.8 pg (ref 26.0–34.0)
MCHC: 33.5 g/dL (ref 30.0–36.0)
MCV: 95 fL (ref 80.0–100.0)
Monocytes Absolute: 0.3 10*3/uL (ref 0.1–1.0)
Monocytes Relative: 7 %
Neutro Abs: 3.1 10*3/uL (ref 1.7–7.7)
Neutrophils Relative %: 75 %
Platelet Count: 199 10*3/uL (ref 150–400)
RBC: 4.21 MIL/uL (ref 3.87–5.11)
RDW: 13.1 % (ref 11.5–15.5)
WBC Count: 4.2 10*3/uL (ref 4.0–10.5)
nRBC: 0 % (ref 0.0–0.2)

## 2019-11-29 LAB — LACTATE DEHYDROGENASE: LDH: 208 U/L — ABNORMAL HIGH (ref 98–192)

## 2019-11-29 LAB — SAVE SMEAR(SSMR), FOR PROVIDER SLIDE REVIEW

## 2019-11-29 NOTE — Telephone Encounter (Signed)
Return as needed er 4/20 los

## 2019-11-29 NOTE — Progress Notes (Signed)
[facility] OFFICE PROGRESS NOTE  Patient Care Team: Zola Button, Grayling Congress, DO as PCP - General Marcelle Overlie, MD as Consulting Physician (Obstetrics and Gynecology)  HEME/ONC OVERVIEW: 1. History of intermittent leukopenia with neutropenia -Baseline WBC between 3-4k since 2015 -WBC 2.9k with ANC ~1100 in 04/2019  Peripheral blood smear, infectious and nutritional studies unremarkable   ASSESSMENT & PLAN:   History of intermittent leukopenia with neutropenia -Most likely benign  -Peripheral blood smear, infectious and nutritional studies unremarkable in 2020 -Clinically, patient denies any constitutional symptoms or symptoms of infection. -WBC 4.2k with ANC 3100 today, normalized -Given the unremarkable work-up and normalization of CBC, I do not think that there is any indication for further work-up at this time  -I counseled the patient on some of the concerning symptoms, including unexplained persistent fever, night sweats, weight loss, lymphadenopathy, for which she should contact her PCP for further evaluation  No orders of the defined types were placed in this encounter.  The total time spent in the encounter was 25 minutes, including face-to-face time with the patient, review of various tests results, order additional studies/medications, documentation, and coordination of care plan.   All questions were answered. The patient knows to call the clinic with any problems, questions or concerns. No barriers to learning was detected.  Return as needed.  Pamela Holms, MD 4/20/202111:44 AM  CHIEF COMPLAINT: "I am feeling fine"  INTERVAL HISTORY: [name] returns to clinic for follow-up of history of leukopenia with neutropenia.  Patient reports that she has been feeling well since her last visit, denies any constitutional symptoms or symptoms of infection.  She denies any complaint today.  REVIEW OF SYSTEMS:   Constitutional: ( - ) fevers, ( - )  chills , ( - )  night sweats Eyes: ( - ) blurriness of vision, ( - ) double vision, ( - ) watery eyes Ears, nose, mouth, throat, and face: ( - ) mucositis, ( - ) sore throat Respiratory: ( - ) cough, ( - ) dyspnea, ( - ) wheezes Cardiovascular: ( - ) palpitation, ( - ) chest discomfort, ( - ) lower extremity swelling Gastrointestinal:  ( - ) nausea, ( - ) heartburn, ( - ) change in bowel habits Skin: ( - ) abnormal skin rashes Lymphatics: ( - ) new lymphadenopathy, ( - ) easy bruising Neurological: ( - ) numbness, ( - ) tingling, ( - ) new weaknesses Behavioral/Psych: ( - ) mood change, ( - ) new changes  All other systems were reviewed with the patient and are negative.  SUMMARY OF ONCOLOGIC HISTORY: Oncology History   No history exists.    I have reviewed the past medical history, past surgical history, social history and family history with the patient and they are unchanged from previous note.  ALLERGIES:  is allergic to sulfonamide derivatives and penicillins.  MEDICATIONS:  Current Outpatient Medications  Medication Sig Dispense Refill  . Estriol POWD      No current facility-administered medications for this visit.    PHYSICAL EXAMINATION: ECOG PERFORMANCE STATUS: 0 - Asymptomatic  Today's Vitals   11/29/19 1108  BP: (!) 118/58  Pulse: 82  Resp: 18  Temp: (!) 97.3 F (36.3 C)  TempSrc: Temporal  SpO2: 100%  Weight: 161 lb (73 kg)  Height: 5\' 4"  (1.626 m)  PainSc: 0-No pain   Body mass index is 27.64 kg/m.  Filed Weights   11/29/19 1108  Weight: 161 lb (73 kg)  GENERAL: alert, no distress and comfortable SKIN: skin color, texture, turgor are normal, no rashes or significant lesions EYES: conjunctiva are pink and non-injected, sclera clear OROPHARYNX: no exudate, no erythema; lips, buccal mucosa, and tongue normal  NECK: supple, non-tender LUNGS: clear to auscultation with normal breathing effort HEART: regular rate & rhythm and no murmurs and no lower extremity  edema ABDOMEN: soft, non-tender, non-distended, normal bowel sounds Musculoskeletal: no cyanosis of digits and no clubbing  PSYCH: alert & oriented x 3, fluent speech  LABORATORY DATA:  I have reviewed the data as listed    Component Value Date/Time   NA 139 11/29/2019 1029   K 4.2 11/29/2019 1029   CL 107 11/29/2019 1029   CO2 27 11/29/2019 1029   GLUCOSE 103 (H) 11/29/2019 1029   BUN 15 11/29/2019 1029   CREATININE 0.71 11/29/2019 1029   CALCIUM 9.0 11/29/2019 1029   CALCIUM 9.2 03/14/2010 2352   PROT 6.7 11/29/2019 1029   ALBUMIN 4.0 11/29/2019 1029   AST 24 11/29/2019 1029   ALT 19 11/29/2019 1029   ALKPHOS 54 11/29/2019 1029   BILITOT 0.6 11/29/2019 1029   GFRNONAA >60 11/29/2019 1029   GFRAA >60 11/29/2019 1029    No results found for: SPEP, UPEP  Lab Results  Component Value Date   WBC 4.2 11/29/2019   NEUTROABS 3.1 11/29/2019   HGB 13.4 11/29/2019   HCT 40.0 11/29/2019   MCV 95.0 11/29/2019   PLT 199 11/29/2019      Chemistry      Component Value Date/Time   NA 139 11/29/2019 1029   K 4.2 11/29/2019 1029   CL 107 11/29/2019 1029   CO2 27 11/29/2019 1029   BUN 15 11/29/2019 1029   CREATININE 0.71 11/29/2019 1029      Component Value Date/Time   CALCIUM 9.0 11/29/2019 1029   CALCIUM 9.2 03/14/2010 2352   ALKPHOS 54 11/29/2019 1029   AST 24 11/29/2019 1029   ALT 19 11/29/2019 1029   BILITOT 0.6 11/29/2019 1029       RADIOGRAPHIC STUDIES: I have personally reviewed the radiological images as listed below and agreed with the findings in the report. No results found.

## 2019-11-30 DIAGNOSIS — Z23 Encounter for immunization: Secondary | ICD-10-CM | POA: Diagnosis not present

## 2019-12-06 DIAGNOSIS — L988 Other specified disorders of the skin and subcutaneous tissue: Secondary | ICD-10-CM | POA: Diagnosis not present

## 2019-12-06 DIAGNOSIS — D485 Neoplasm of uncertain behavior of skin: Secondary | ICD-10-CM | POA: Diagnosis not present

## 2020-01-23 DIAGNOSIS — Z1231 Encounter for screening mammogram for malignant neoplasm of breast: Secondary | ICD-10-CM | POA: Diagnosis not present

## 2020-01-23 DIAGNOSIS — Z6827 Body mass index (BMI) 27.0-27.9, adult: Secondary | ICD-10-CM | POA: Diagnosis not present

## 2020-01-23 DIAGNOSIS — Z13820 Encounter for screening for osteoporosis: Secondary | ICD-10-CM | POA: Diagnosis not present

## 2020-01-23 DIAGNOSIS — Z01419 Encounter for gynecological examination (general) (routine) without abnormal findings: Secondary | ICD-10-CM | POA: Diagnosis not present

## 2020-03-06 DIAGNOSIS — L820 Inflamed seborrheic keratosis: Secondary | ICD-10-CM | POA: Diagnosis not present

## 2020-03-27 ENCOUNTER — Telehealth (INDEPENDENT_AMBULATORY_CARE_PROVIDER_SITE_OTHER): Payer: BLUE CROSS/BLUE SHIELD | Admitting: Family Medicine

## 2020-03-27 ENCOUNTER — Other Ambulatory Visit: Payer: Self-pay

## 2020-03-27 ENCOUNTER — Encounter: Payer: Self-pay | Admitting: Family Medicine

## 2020-03-27 DIAGNOSIS — J0141 Acute recurrent pansinusitis: Secondary | ICD-10-CM

## 2020-03-27 MED ORDER — DOXYCYCLINE HYCLATE 100 MG PO TABS: 100.0000 mg | ORAL_TABLET | Freq: Two times a day (BID) | ORAL | 0 refills | Status: DC

## 2020-03-27 MED ORDER — FLUTICASONE PROPIONATE 50 MCG/ACT NA SUSP: 2.0000 | Freq: Every day | NASAL | 6 refills | Status: DC

## 2020-03-27 NOTE — Progress Notes (Signed)
Virtual Visit via Video Note  I connected with LUPITA ROSALES on 03/27/20 at  2:20 PM EDT by a video enabled telemedicine application and verified that I am speaking with the correct person using two identifiers.  Location: Patient: work  Provider: office    I discussed the limitations of evaluation and management by telemedicine and the availability of in person appointments. The patient expressed understanding and agreed to proceed.  History of Present Illness: Pt is c/o congestion in sinuses and runny nose x 2 weeks.  She has been taking dayquil / nyquil and now she has sinus headache and pressure   No fevers , no chills    Observations/Objective: There were no vitals filed for this visit.  Afebrile  Pt is in nad  Assessment and Plan: 1. Acute recurrent pansinusitis abx and flonase Can add antihistamine prn  Consider covid test--- she has had the vaccine - doxycycline (VIBRA-TABS) 100 MG tablet; Take 1 tablet (100 mg total) by mouth 2 (two) times daily.  Dispense: 20 tablet; Refill: 0 - fluticasone (FLONASE) 50 MCG/ACT nasal spray; Place 2 sprays into both nostrils daily.  Dispense: 16 g; Refill: 6   Follow Up Instructions:    I discussed the assessment and treatment plan with the patient. The patient was provided an opportunity to ask questions and all were answered. The patient agreed with the plan and demonstrated an understanding of the instructions.   The patient was advised to call back or seek an in-person evaluation if the symptoms worsen or if the condition fails to improve as anticipated.  I provided 25 minutes of non-face-to-face time during this encounter.   Donato Schultz, DO

## 2020-05-10 ENCOUNTER — Encounter: Payer: BC Managed Care – PPO | Admitting: Family Medicine

## 2020-06-07 ENCOUNTER — Encounter: Payer: Self-pay | Admitting: Family Medicine

## 2020-06-07 ENCOUNTER — Ambulatory Visit: Payer: BLUE CROSS/BLUE SHIELD | Admitting: Family Medicine

## 2020-06-07 ENCOUNTER — Other Ambulatory Visit: Payer: Self-pay

## 2020-06-07 ENCOUNTER — Ambulatory Visit: Payer: Self-pay

## 2020-06-07 ENCOUNTER — Ambulatory Visit (INDEPENDENT_AMBULATORY_CARE_PROVIDER_SITE_OTHER): Payer: BLUE CROSS/BLUE SHIELD

## 2020-06-07 VITALS — BP 130/76 | HR 64 | Ht 64.0 in | Wt 161.0 lb

## 2020-06-07 DIAGNOSIS — M25522 Pain in left elbow: Secondary | ICD-10-CM

## 2020-06-07 DIAGNOSIS — S56519A Strain of other extensor muscle, fascia and tendon at forearm level, unspecified arm, initial encounter: Secondary | ICD-10-CM

## 2020-06-07 MED ORDER — NITROGLYCERIN 0.2 MG/HR TD PT24: MEDICATED_PATCH | TRANSDERMAL | 0 refills | Status: DC

## 2020-06-07 NOTE — Assessment & Plan Note (Signed)
Partial tear noted, started on nitroglycerin patches.  Warned of potential side effects.  Patient has not had a history of any headaches previously.  Discussed with patient that it is only a quarter of a pack daily.  Patient understands this.  We will get x-rays of the elbow as well secondary to the small effusion noted on ultrasound.  Discussed avoiding repetitive extension of the wrist.  Work with Event organiser to learn home exercises, follow-up again in 4 to 8 weeks

## 2020-06-07 NOTE — Patient Instructions (Signed)
Xray today Compression sleeve Nitro patches Nitroglycerin Protocol   Apply 1/4 nitroglycerin patch to affected area daily.  Change position of patch within the affected area every 24 hours.  You may experience a headache during the first 1-2 weeks of using the patch, these should subside.  If you experience headaches after beginning nitroglycerin patch treatment, you may take your preferred over the counter pain reliever.  Another side effect of the nitroglycerin patch is skin irritation or rash related to patch adhesive.  Please notify our office if you develop more severe headaches or rash, and stop the patch.  Tendon healing with nitroglycerin patch may require 12 to 24 weeks depending on the extent of injury.  Men should not use if taking Viagra, Cialis, or Levitra.   Do not use if you have migraines or rosacea. Ice 20 min 2x a day Avoid overhand lifting See me in 4-6 weeks

## 2020-06-07 NOTE — Progress Notes (Signed)
[facility] [address] [place] [address] Phone: [phone] Subjective:   [name], am serving as a scribe for Dr. Antoine Primas. This visit occurred during the SARS-CoV-2 public health emergency.  Safety protocols were in place, including screening questions prior to the visit, additional usage of staff PPE, and extensive cleaning of exam room while observing appropriate contact time as indicated for disinfecting solutions.  I'm seeing this patient by the request  of:  Donato Schultz, DO  CC: Left elbow pain  [id number]  [name] is a 64 y.o. female coming in with complaint of left elbow pain. Last seen for shoulder bursitis in 2018. Patient states that she was carrying a heavy bag with her elbow extended. Felt a pull in her arm. Pain over lateral epicondyle. Pain with dressing. No pain with keyboard. Patient has been taking Aleve for pain.       Past Medical History:  Diagnosis Date  . Allergic rhinitis   . Allergy    Past Surgical History:  Procedure Laterality Date  . BREAST BIOPSY  11-2000   benign  . OOPHORECTOMY    . VAGINAL HYSTERECTOMY     Social History   Socioeconomic History  . Marital status: Married    Spouse name: Not on file  . Number of children: 1  . Years of education: Not on file  . Highest education level: Not on file  Occupational History  . Occupation: International aid/development worker  Tobacco Use  . Smoking status: Never Smoker  . Smokeless tobacco: Never Used  Vaping Use  . Vaping Use: Never used  Substance and Sexual Activity  . Alcohol use: Yes    Comment: rare  . Drug use: No  . Sexual activity: Yes    Partners: Male  Other Topics Concern  . Not on file  Social History Narrative   Exercise--no   Social Determinants of Health   Financial Resource Strain:   . Difficulty of Paying Living Expenses: Not on file  Food Insecurity:   . Worried About Programme researcher, broadcasting/film/video in the Last Year:  Not on file  . Ran Out of Food in the Last Year: Not on file  Transportation Needs:   . Lack of Transportation (Medical): Not on file  . Lack of Transportation (Non-Medical): Not on file  Physical Activity:   . Days of Exercise per Week: Not on file  . Minutes of Exercise per Session: Not on file  Stress:   . Feeling of Stress : Not on file  Social Connections:   . Frequency of Communication with Friends and Family: Not on file  . Frequency of Social Gatherings with Friends and Family: Not on file  . Attends Religious Services: Not on file  . Active Member of Clubs or Organizations: Not on file  . Attends Banker Meetings: Not on file  . Marital Status: Not on file   Allergies  Allergen Reactions  . Sulfonamide Derivatives Swelling  . Penicillins Rash   Family History  Problem Relation Age of Onset  . Breast cancer Mother   . Hypertension Mother   . Stroke Mother   . Pancreatitis Father      Current Outpatient Medications (Cardiovascular):  .  nitroGLYCERIN (NITRO-DUR) 0.2 mg/hr patch, Apply 1/4 of a patch to skin once daily.  Current Outpatient Medications (Respiratory):  .  fluticasone (FLONASE) 50 MCG/ACT nasal spray, Place 2 sprays into both nostrils daily.  Current Outpatient Medications (Other):  .  doxycycline (VIBRA-TABS) 100 MG tablet, Take 1 tablet (100 mg total) by mouth 2 (two) times daily. .  Estriol POWD,    Reviewed prior external information including notes and imaging from  primary care provider As well as notes that were available from care everywhere and other healthcare systems.  Past medical history, social, surgical and family history all reviewed in electronic medical record.  No pertanent information unless stated regarding to the chief complaint.   Review of Systems:  No headache, visual changes, nausea, vomiting, diarrhea, constipation, dizziness, abdominal pain, skin rash, fevers, chills, night sweats, weight loss, swollen  lymph nodes, body aches, joint swelling, chest pain, shortness of breath, mood changes. POSITIVE muscle aches  Objective  Blood pressure 130/76, pulse 64, height 5\' 4"  (1.626 m), weight 161 lb (73 kg), SpO2 99 %.   General: No apparent distress alert and oriented x3 mood and affect normal, dressed appropriately.  HEENT: Pupils equal, extraocular movements intact  Respiratory: Patient's speak in full sentences and does not appear short of breath  Cardiovascular: No lower extremity edema, non tender, no erythema  Neuro: Cranial nerves II through XII are intact, neurovascularly intact in all extremities with 2+ DTRs and 2+ pulses.  Gait normal with good balance and coordination.  MSK: Left elbow exam shows the patient is tender to palpation over the lateral epicondylar region.  Worsening pain with any type of resisted wrist extension.  No significant erythema noted.  Lipoma noted in the mid substance of the forearm.  Full range of motion of the elbow noted.  Limited musculoskeletal ultrasound was performed and interpreted by .  Limited ultrasound of patient's left elbow shows that patient does have some intrasubstance tearing noted of the common extensor tendon.  No true full-thickness tear noted.  Trace effusion it looks like of the elbow joint noted.  Patient does have increasing Doppler flow in the area. Impression: Common extensor tendon tear.     Impression and Recommendations:     The above documentation has been reviewed and is accurate and complete Judi Saa, DO

## 2020-07-10 ENCOUNTER — Other Ambulatory Visit: Payer: Self-pay

## 2020-07-10 ENCOUNTER — Ambulatory Visit (INDEPENDENT_AMBULATORY_CARE_PROVIDER_SITE_OTHER): Payer: BLUE CROSS/BLUE SHIELD | Admitting: Family Medicine

## 2020-07-10 ENCOUNTER — Encounter: Payer: Self-pay | Admitting: Family Medicine

## 2020-07-10 VITALS — BP 124/78 | HR 68 | Temp 98.2°F | Resp 18 | Ht 64.0 in | Wt 168.0 lb

## 2020-07-10 DIAGNOSIS — Z0000 Encounter for general adult medical examination without abnormal findings: Secondary | ICD-10-CM

## 2020-07-10 NOTE — Progress Notes (Signed)
Subjective:     [name] is a 64 y.o. female and is here for a comprehensive physical exam. The patient reports no problems.  Social History   Socioeconomic History  . Marital status: Married    Spouse name: Not on file  . Number of children: 1  . Years of education: Not on file  . Highest education level: Not on file  Occupational History  . Occupation: International aid/development worker  Tobacco Use  . Smoking status: Never Smoker  . Smokeless tobacco: Never Used  Vaping Use  . Vaping Use: Never used  Substance and Sexual Activity  . Alcohol use: Yes    Comment: rare  . Drug use: No  . Sexual activity: Yes    Partners: Male  Other Topics Concern  . Not on file  Social History Narrative   Exercise--no   Social Determinants of Health   Financial Resource Strain:   . Difficulty of Paying Living Expenses: Not on file  Food Insecurity:   . Worried About Programme researcher, broadcasting/film/video in the Last Year: Not on file  . Ran Out of Food in the Last Year: Not on file  Transportation Needs:   . Lack of Transportation (Medical): Not on file  . Lack of Transportation (Non-Medical): Not on file  Physical Activity:   . Days of Exercise per Week: Not on file  . Minutes of Exercise per Session: Not on file  Stress:   . Feeling of Stress : Not on file  Social Connections:   . Frequency of Communication with Friends and Family: Not on file  . Frequency of Social Gatherings with Friends and Family: Not on file  . Attends Religious Services: Not on file  . Active Member of Clubs or Organizations: Not on file  . Attends Banker Meetings: Not on file  . Marital Status: Not on file  Intimate Partner Violence:   . Fear of Current or Ex-Partner: Not on file  . Emotionally Abused: Not on file  . Physically Abused: Not on file  . Sexually Abused: Not on file   Health Maintenance  Topic Date Due  . COVID-19 Vaccine (1) Never done  . PAP SMEAR-Modifier  01/01/2021  . MAMMOGRAM  01/09/2021  .  COLONOSCOPY  11/18/2022  . TETANUS/TDAP  04/03/2027  . INFLUENZA VACCINE  Completed  . Hepatitis C Screening  Completed  . HIV Screening  Completed    The following portions of the patient's history were reviewed and updated as appropriate:  She  has a past medical history of Allergic rhinitis and Allergy. She does not have any pertinent problems on file. She  has a past surgical history that includes Breast biopsy (11-2000); Vaginal hysterectomy; and Oophorectomy. Her family history includes Breast cancer in her mother; Hypertension in her mother; Pancreatitis in her father; Stroke in her mother. She  reports that she has never smoked. She has never used smokeless tobacco. She reports current alcohol use. She reports that she does not use drugs. She has a current medication list which includes the following prescription(s): estriol, fluticasone, and nitroglycerin. Current Outpatient Medications on File Prior to Visit  Medication Sig Dispense Refill  . Estriol POWD     . fluticasone (FLONASE) 50 MCG/ACT nasal spray Place 2 sprays into both nostrils daily. 16 g 6  . nitroGLYCERIN (NITRO-DUR) 0.2 mg/hr patch Apply 1/4 of a patch to skin once daily. 30 patch 0   No current facility-administered medications on file prior to visit.  She is allergic to sulfonamide derivatives and penicillins..  Review of Systems Review of Systems  Constitutional: Negative for activity change, appetite change and fatigue.  HENT: Negative for hearing loss, congestion, tinnitus and ear discharge.  dentist q76m Eyes: Negative for visual disturbance (see optho q1y -- vision corrected to 20/20 with glasses).  Respiratory: Negative for cough, chest tightness and shortness of breath.   Cardiovascular: Negative for chest pain, palpitations and leg swelling.  Gastrointestinal: Negative for abdominal pain, diarrhea, constipation and abdominal distention.  Genitourinary: Negative for urgency, frequency, decreased urine  volume and difficulty urinating.  Musculoskeletal: Negative for back pain, arthralgias and gait problem.  Skin: Negative for color change, pallor and rash.  Neurological: Negative for dizziness, light-headedness, numbness and headaches.  Hematological: Negative for adenopathy. Does not bruise/bleed easily.  Psychiatric/Behavioral: Negative for suicidal ideas, confusion, sleep disturbance, self-injury, dysphoric mood, decreased concentration and agitation.       Objective:    BP 124/78 (BP Location: Right Arm, Patient Position: Sitting, Cuff Size: Normal)   Pulse 68   Temp 98.2 F (36.8 C) (Oral)   Resp 18   Ht 5\' 4"  (1.626 m)   Wt 168 lb (76.2 kg)   SpO2 100%   BMI 28.84 kg/m  General appearance: alert, cooperative, appears stated age and no distress Head: Normocephalic, without obvious abnormality, atraumatic Eyes: negative findings: lids and lashes normal, conjunctivae and sclerae normal and pupils equal, round, reactive to light and accomodation Ears: normal TM's and external ear canals both ears Neck: no adenopathy, no carotid bruit, no JVD, supple, symmetrical, trachea midline and thyroid not enlarged, symmetric, no tenderness/mass/nodules Back: symmetric, no curvature. ROM normal. No CVA tenderness. Lungs: clear to auscultation bilaterally Breasts: gyn Heart: regular rate and rhythm, S1, S2 normal, no murmur, click, rub or gallop Abdomen: soft, non-tender; bowel sounds normal; no masses,  no organomegaly Pelvic: deferred ---gyn Extremities: extremities normal, atraumatic, no cyanosis or edema Pulses: 2+ and symmetric Skin: Skin color, texture, turgor normal. No rashes or lesions Lymph nodes: Cervical, supraclavicular, and axillary nodes normal. Neurologic: Alert and oriented X 3, normal strength and tone. Normal symmetric reflexes. Normal coordination and gait    Assessment:    Healthy female exam.      Plan:  ghm utd Check labs Pt will send her covid vaccine  info   See After Visit Summary for Counseling Recommendations

## 2020-07-10 NOTE — Patient Instructions (Signed)

## 2020-07-11 ENCOUNTER — Ambulatory Visit: Payer: Self-pay

## 2020-07-11 ENCOUNTER — Ambulatory Visit: Payer: BLUE CROSS/BLUE SHIELD | Admitting: Family Medicine

## 2020-07-11 ENCOUNTER — Encounter: Payer: Self-pay | Admitting: Family Medicine

## 2020-07-11 VITALS — BP 124/82 | HR 89 | Ht 64.0 in | Wt 169.0 lb

## 2020-07-11 DIAGNOSIS — S56519A Strain of other extensor muscle, fascia and tendon at forearm level, unspecified arm, initial encounter: Secondary | ICD-10-CM | POA: Diagnosis not present

## 2020-07-11 LAB — COMPREHENSIVE METABOLIC PANEL
ALT: 25 U/L (ref 0–35)
AST: 25 U/L (ref 0–37)
Albumin: 4.1 g/dL (ref 3.5–5.2)
Alkaline Phosphatase: 54 U/L (ref 39–117)
BUN: 19 mg/dL (ref 6–23)
CO2: 30 mEq/L (ref 19–32)
Calcium: 9.1 mg/dL (ref 8.4–10.5)
Chloride: 101 mEq/L (ref 96–112)
Creatinine, Ser: 0.7 mg/dL (ref 0.40–1.20)
GFR: 91.82 mL/min (ref >60.00)
Glucose, Bld: 87 mg/dL (ref 70–99)
Potassium: 3.9 mEq/L (ref 3.5–5.1)
Sodium: 137 mEq/L (ref 135–145)
Total Bilirubin: 0.5 mg/dL (ref 0.2–1.2)
Total Protein: 6.9 g/dL (ref 6.0–8.3)

## 2020-07-11 LAB — TSH: TSH: 1.55 u[IU]/mL (ref 0.35–4.50)

## 2020-07-11 LAB — LIPID PANEL
Cholesterol: 175 mg/dL (ref 0–200)
HDL: 83.3 mg/dL (ref >39.00)
LDL Cholesterol: 83 mg/dL (ref 0–99)
NonHDL: 91.57
Total CHOL/HDL Ratio: 2
Triglycerides: 43 mg/dL (ref 0.0–149.0)
VLDL: 8.6 mg/dL (ref 0.0–40.0)

## 2020-07-11 LAB — CBC WITH DIFFERENTIAL/PLATELET
Basophils Absolute: 0 10*3/uL (ref 0.0–0.1)
Basophils Relative: 0.8 % (ref 0.0–3.0)
Eosinophils Absolute: 0.1 10*3/uL (ref 0.0–0.7)
Eosinophils Relative: 1.7 % (ref 0.0–5.0)
HCT: 41.1 % (ref 36.0–46.0)
Hemoglobin: 14 g/dL (ref 12.0–15.0)
Lymphocytes Relative: 42.3 % (ref 12.0–46.0)
Lymphs Abs: 1.7 10*3/uL (ref 0.7–4.0)
MCHC: 34 g/dL (ref 30.0–36.0)
MCV: 95.2 fl (ref 78.0–100.0)
Monocytes Absolute: 0.4 10*3/uL (ref 0.1–1.0)
Monocytes Relative: 9.3 % (ref 3.0–12.0)
Neutro Abs: 1.8 10*3/uL (ref 1.4–7.7)
Neutrophils Relative %: 45.9 % (ref 43.0–77.0)
Platelets: 228 10*3/uL (ref 150.0–400.0)
RBC: 4.32 Mil/uL (ref 3.87–5.11)
RDW: 13.2 % (ref 11.5–15.5)
WBC: 3.9 10*3/uL — ABNORMAL LOW (ref 4.0–10.5)

## 2020-07-11 NOTE — Patient Instructions (Signed)
Keep doing nitro and exercises See me again in 6 weeks If not 80% better, we will consider MRI

## 2020-07-11 NOTE — Assessment & Plan Note (Signed)
Mild improvement noted.  Patient still has a tear noted and does seem to be partial.  Still no retraction.  Increase in neovascularization on ultrasound.  Patient does state about 50% better symptomatically.  Patient wants to continue with the conservative therapy even given the choice of the MRI which patient declined.  Patient will continue with the home exercises, compression, nitroglycerin and icing regimen.  Follow-up again in 6 weeks for further evaluation

## 2020-07-11 NOTE — Progress Notes (Signed)
[facility] [address] [place] [address] Phone: [phone] Subjective:   [name], am serving as a scribe for Dr. Antoine Primas. This visit occurred during the SARS-CoV-2 public health emergency.  Safety protocols were in place, including screening questions prior to the visit, additional usage of staff PPE, and extensive cleaning of exam room while observing appropriate contact time as indicated for disinfecting solutions.   I'm seeing this patient by the request  of:  Donato Schultz, DO  CC: Left elbow pain  JSE:[id number]   06/07/2020 Partial tear noted, started on nitroglycerin patches.  Warned of potential side effects.  Patient has not had a history of any headaches previously.  Discussed with patient that it is only a quarter of a pack daily.  Patient understands this.  We will get x-rays of the elbow as well secondary to the small effusion noted on ultrasound.  Discussed avoiding repetitive extension of the wrist.  Work with athletic trainer to learn home exercises, follow-up again in 4 to 8 weeks  Update 07/11/2020 WENDOLYN RASO is a 64 y.o. female coming in with complaint of left elbow pain. Pain was constant but is now intermittent. Using compression sleeve and is using nitro patches. Sleeps on left side and this increases her pain. Pain improves throughout the day. Keying at work is fine but holding onto a large coffee mug will increase pain.       Past Medical History:  Diagnosis Date  . Allergic rhinitis   . Allergy    Past Surgical History:  Procedure Laterality Date  . BREAST BIOPSY  11-2000   benign  . OOPHORECTOMY    . VAGINAL HYSTERECTOMY     Social History   Socioeconomic History  . Marital status: Married    Spouse name: Not on file  . Number of children: 1  . Years of education: Not on file  . Highest education level: Not on file  Occupational History  . Occupation: International aid/development worker  Tobacco Use    . Smoking status: Never Smoker  . Smokeless tobacco: Never Used  Vaping Use  . Vaping Use: Never used  Substance and Sexual Activity  . Alcohol use: Yes    Comment: rare  . Drug use: No  . Sexual activity: Yes    Partners: Male  Other Topics Concern  . Not on file  Social History Narrative   Exercise--no   Social Determinants of Health   Financial Resource Strain:   . Difficulty of Paying Living Expenses: Not on file  Food Insecurity:   . Worried About Programme researcher, broadcasting/film/video in the Last Year: Not on file  . Ran Out of Food in the Last Year: Not on file  Transportation Needs:   . Lack of Transportation (Medical): Not on file  . Lack of Transportation (Non-Medical): Not on file  Physical Activity:   . Days of Exercise per Week: Not on file  . Minutes of Exercise per Session: Not on file  Stress:   . Feeling of Stress : Not on file  Social Connections:   . Frequency of Communication with Friends and Family: Not on file  . Frequency of Social Gatherings with Friends and Family: Not on file  . Attends Religious Services: Not on file  . Active Member of Clubs or Organizations: Not on file  . Attends Banker Meetings: Not on file  . Marital Status: Not on file   Allergies  Allergen Reactions  . Sulfonamide Derivatives Swelling  . Penicillins Rash   Family History  Problem Relation Age of Onset  . Breast cancer Mother   . Hypertension Mother   . Stroke Mother   . Pancreatitis Father      Current Outpatient Medications (Cardiovascular):  .  nitroGLYCERIN (NITRO-DUR) 0.2 mg/hr patch, Apply 1/4 of a patch to skin once daily.  Current Outpatient Medications (Respiratory):  .  fluticasone (FLONASE) 50 MCG/ACT nasal spray, Place 2 sprays into both nostrils daily.    Current Outpatient Medications (Other):  Marland Kitchen  Estriol POWD,    Reviewed prior external information including notes and imaging from  primary care provider As well as notes that were available  from care everywhere and other healthcare systems.  Past medical history, social, surgical and family history all reviewed in electronic medical record.  No pertanent information unless stated regarding to the chief complaint.   Review of Systems:  No headache, visual changes, nausea, vomiting, diarrhea, constipation, dizziness, abdominal pain, skin rash, fevers, chills, night sweats, weight loss, swollen lymph nodes, body aches, joint swelling, chest pain, shortness of breath, mood changes. POSITIVE muscle aches  Objective  Blood pressure 124/82, pulse 89, height 5\' 4"  (1.626 m), weight 169 lb (76.7 kg), SpO2 99 %.   General: No apparent distress alert and oriented x3 mood and affect normal, dressed appropriately.  HEENT: Pupils equal, extraocular movements intact  Respiratory: Patient's speak in full sentences and does not appear short of breath  Cardiovascular: No lower extremity edema, non tender, no erythema  Left elbow exam shows the patient does have tenderness to palpation still over the lateral epicondylar region.  Improvement in type of the swelling that was noted previously.  Patient does have good grip strength.  No numbness noted.  Does have pain though in the lateral epicondylar region with resisted wrist extension  Limited musculoskeletal ultrasound was performed and interpreted by  Limited ultrasound of patient's lateral epicondylar region shows the patient does have common extensor tendon tear still noted.  Very minimal retraction if any.  Patient does have significant increase in neovascularization and decrease in the hypoechoic changes that were seen previously.   Impression and Recommendations:     The above documentation has been reviewed and is accurate and complete Judi Saa, DO

## 2020-07-16 ENCOUNTER — Encounter: Payer: Self-pay | Admitting: Family Medicine

## 2020-08-22 ENCOUNTER — Other Ambulatory Visit: Payer: Self-pay

## 2020-08-22 ENCOUNTER — Encounter: Payer: Self-pay | Admitting: Family Medicine

## 2020-08-22 ENCOUNTER — Ambulatory Visit: Payer: BLUE CROSS/BLUE SHIELD | Admitting: Family Medicine

## 2020-08-22 ENCOUNTER — Ambulatory Visit: Payer: Self-pay

## 2020-08-22 VITALS — BP 132/84 | HR 63 | Ht 64.0 in | Wt 166.0 lb

## 2020-08-22 DIAGNOSIS — S56519A Strain of other extensor muscle, fascia and tendon at forearm level, unspecified arm, initial encounter: Secondary | ICD-10-CM

## 2020-08-22 DIAGNOSIS — M25522 Pain in left elbow: Secondary | ICD-10-CM | POA: Diagnosis not present

## 2020-08-22 NOTE — Assessment & Plan Note (Signed)
Patient on ultrasound still has some neovascularization and some mild hypoechoic changes of the common extensor tear.  Patient is feeling 95% better than he at this time.  He would impact, patient has discontinued the compression already at this time.  We will see if patient does relatively well or if worsening pain I would encourage patient to have an MRI and then we will discuss the possibility of PRP.  Patient will have a follow-up again in 2 to 3 months otherwise and in the interim continue to avoid any overhead lifting.

## 2020-08-22 NOTE — Progress Notes (Signed)
[facility] [address] [place] [address] Phone: [phone] Subjective:   [name], am serving as a scribe for Dr. Antoine Primas. This visit occurred during the SARS-CoV-2 public health emergency.  Safety protocols were in place, including screening questions prior to the visit, additional usage of staff PPE, and extensive cleaning of exam room while observing appropriate contact time as indicated for disinfecting solutions.   I'm seeing this patient by the request  of:  Donato Schultz, DO  CC: Left elbow pain follow-up  ASN:[id number]   07/11/2020 Mild improvement noted.  Patient still has a tear noted and does seem to be partial.  Still no retraction.  Increase in neovascularization on ultrasound.  Patient does state about 50% better symptomatically.  Patient wants to continue with the conservative therapy even given the choice of the MRI which patient declined.  Patient will continue with the home exercises, compression, nitroglycerin and icing regimen.  Follow-up again in 6 weeks for further evaluation 166lb Update 08/22/2020 [name] is a 65 y.o. female coming in with complaint of left elbow pain. Patient feels she is improving but has intermittent "twinges" of pain. Pain is over lateral epicondyle with doning clothes. No longer having pain with carrying coffee mug. Has discontinued use of compression sleeve.  Patient would state that she feels 95% better.  Patient discontinued the nitroglycerin patches on her own a couple days ago.  Patient states that most days she does not even think of it.      Past Medical History:  Diagnosis Date  . Allergic rhinitis   . Allergy    Past Surgical History:  Procedure Laterality Date  . BREAST BIOPSY  11-2000   benign  . OOPHORECTOMY    . VAGINAL HYSTERECTOMY     Social History   Socioeconomic History  . Marital status: Married    Spouse name: Not on file  . Number of children: 1   . Years of education: Not on file  . Highest education level: Not on file  Occupational History  . Occupation: International aid/development worker  Tobacco Use  . Smoking status: Never Smoker  . Smokeless tobacco: Never Used  Vaping Use  . Vaping Use: Never used  Substance and Sexual Activity  . Alcohol use: Yes    Comment: rare  . Drug use: No  . Sexual activity: Yes    Partners: Male  Other Topics Concern  . Not on file  Social History Narrative   Exercise--no   Social Determinants of Health   Financial Resource Strain: Not on file  Food Insecurity: Not on file  Transportation Needs: Not on file  Physical Activity: Not on file  Stress: Not on file  Social Connections: Not on file   Allergies  Allergen Reactions  . Sulfonamide Derivatives Swelling  . Penicillins Rash   Family History  Problem Relation Age of Onset  . Breast cancer Mother   . Hypertension Mother   . Stroke Mother   . Pancreatitis Father      Current Outpatient Medications (Cardiovascular):  .  nitroGLYCERIN (NITRO-DUR) 0.2 mg/hr patch, Apply 1/4 of a patch to skin once daily.  Current Outpatient Medications (Respiratory):  .  fluticasone (FLONASE) 50 MCG/ACT nasal spray, Place 2 sprays into both nostrils daily.    Current Outpatient Medications (Other):  Marland Kitchen  Estriol POWD,    Reviewed prior external information including notes and imaging from  primary care provider As well as  notes that were available from care everywhere and other healthcare systems.  Past medical history, social, surgical and family history all reviewed in electronic medical record.  No pertanent information unless stated regarding to the chief complaint.   Review of Systems:  No headache, visual changes, nausea, vomiting, diarrhea, constipation, dizziness, abdominal pain, skin rash, fevers, chills, night sweats, weight loss, swollen lymph nodes, body aches, joint swelling, chest pain, shortness of breath, mood changes. POSITIVE muscle  aches  Objective  Blood pressure 132/84, pulse 63, height 5\' 4"  (1.626 m), weight 166 lb (75.3 kg), SpO2 98 %.   General: No apparent distress alert and oriented x3 mood and affect normal, dressed appropriately.  HEENT: Pupils equal, extraocular movements intact  Respiratory: Patient's speak in full sentences and does not appear short of breath  Cardiovascular: No lower extremity edema, non tender, no erythema  Left elbow exam shows the patient still minorly tender to palpation over the lateral epicondylar region.  Only a twinge noted with resisted wrist extension.  Good grip strength noted.  Limited musculoskeletal ultrasound was performed and interpreted by  Limited ultrasound of patient's left elbow on the lateral epicondylar region still shows the patient has significant increase in neovascularization.  Patient does have some potential new tendon forming in the area where there was potentially a tear previously.  Still significant hypoechoic changes surrounding the area though as well. Impression: Very mild interval healing but patient does have new or increased neovascularization    Impression and Recommendations:    The above documentation has been reviewed and is accurate and complete Judi Saa, DO

## 2020-08-22 NOTE — Patient Instructions (Signed)
Discontinue nitro Keep doing exercises 2x a week Ice after activity No overhand lifting! Have appt in 2-3 months just in case otherwise see me when you need me

## 2020-09-26 DIAGNOSIS — H5213 Myopia, bilateral: Secondary | ICD-10-CM | POA: Diagnosis not present

## 2020-09-26 DIAGNOSIS — H25013 Cortical age-related cataract, bilateral: Secondary | ICD-10-CM | POA: Diagnosis not present

## 2020-09-26 DIAGNOSIS — H2513 Age-related nuclear cataract, bilateral: Secondary | ICD-10-CM | POA: Diagnosis not present

## 2020-09-26 DIAGNOSIS — H52203 Unspecified astigmatism, bilateral: Secondary | ICD-10-CM | POA: Diagnosis not present

## 2020-11-20 ENCOUNTER — Ambulatory Visit: Payer: BLUE CROSS/BLUE SHIELD | Admitting: Family Medicine

## 2020-12-07 ENCOUNTER — Encounter: Payer: Self-pay | Admitting: Emergency Medicine

## 2020-12-07 ENCOUNTER — Other Ambulatory Visit: Payer: Self-pay

## 2020-12-07 ENCOUNTER — Ambulatory Visit
Admission: EM | Admit: 2020-12-07 | Discharge: 2020-12-07 | Disposition: A | Discharge status: Home / Self Care | Payer: BLUE CROSS/BLUE SHIELD | Attending: Emergency Medicine | Admitting: Emergency Medicine

## 2020-12-07 DIAGNOSIS — J019 Acute sinusitis, unspecified: Secondary | ICD-10-CM

## 2020-12-07 MED ORDER — CEFDINIR 300 MG PO CAPS: 300.0000 mg | ORAL_CAPSULE | Freq: Two times a day (BID) | ORAL | 0 refills | Status: AC

## 2020-12-07 MED ORDER — PREDNISONE 20 MG PO TABS: 40.0000 mg | ORAL_TABLET | Freq: Every day | ORAL | 0 refills | Status: AC

## 2020-12-07 NOTE — Discharge Instructions (Signed)
Omnicef twice daily for 1 week Prednisone daily for 5 days-take with food and earlier in the day if possible May use over-the-counter cetirizine/Zyrtec or loratadine/Claritin to further help with congestion and postnasal drainage Rest and fluids Follow-up if not improving or worsening

## 2020-12-07 NOTE — ED Triage Notes (Signed)
Patient c/o nasal congestion and sinus pressure x 3 weeks.   Patient endorses chills upon onset of symptoms.   Patient was prescribed doxycycline and finished last dose on Sunday with no relief of symptoms.   Patient endorses a nonproductive cough at times.   Patient hasn't taken any other medications.

## 2020-12-08 NOTE — ED Provider Notes (Signed)
[facility]    CSN: [id number] Arrival date & time: 12/07/20  1804      History   Chief Complaint Chief Complaint  Patient presents with  . Nasal Congestion  . Facial Pain    HPI [name] is a 65 y.o. female presenting today for evaluation of nasal congestion sinus pressure.  Reports symptoms for approximately 3 weeks.  Initially prescribed course of doxycycline by PCP with some improvement, but has not fully resolved.  She has had chills and a nonproductive cough at times, but denies any symptoms within chest, mainly in throat/sinuses.  Denies fevers.  HPI  Past Medical History:  Diagnosis Date  . Allergic rhinitis   . Allergy     Patient Active Problem List   Diagnosis Date Noted  . Partial tear of common extensor tendon of elbow 06/07/2020  . Leukopenia 05/30/2019  . AC joint pain 04/04/2016  . Calcific bursitis of shoulder 04/04/2016  . Right arm pain 11/22/2015  . Sinusitis, acute maxillary 08/02/2014  . THYROIDITIS 03/14/2010  . ABNORMAL THYROID FUNCTION TESTS 02/27/2010  . NUMBNESS, HAND 02/12/2010  . EDEMA 02/12/2010  . VAGINAL BLEEDING, ABNORMAL 12/14/2008  . IRREGULAR MENSES 12/04/2008  . ALLERGIC RHINITIS 06/13/2008  . UTI 02/18/2008  . SINUSITIS- ACUTE-NOS 06/25/2007  . PHARYNGITIS, ACUTE 02/16/2007  . FOREIGN BODY, FOOT/TOE W/O INFECTION 02/16/2007    Past Surgical History:  Procedure Laterality Date  . BREAST BIOPSY  11-2000   benign  . OOPHORECTOMY    . VAGINAL HYSTERECTOMY      OB History   No obstetric history on file.      Home Medications    Prior to Admission medications   Medication Sig Start Date End Date Taking? Authorizing Provider  cefdinir (OMNICEF) 300 MG capsule Take 1 capsule (300 mg total) by mouth 2 (two) times daily for 7 days. 12/07/20 12/14/20 Yes Jansen Goodpasture, [place] C, PA-C  Estriol POWD  07/26/13  Yes Marcelle Overlie, MD  predniSONE (DELTASONE) 20 MG tablet Take 2 tablets (40 mg total) by mouth daily  with breakfast for 5 days. 12/07/20 12/12/20 Yes Margene Cherian C, PA-C  fluticasone (FLONASE) 50 MCG/ACT nasal spray Place 2 sprays into both nostrils daily. 03/27/20 12/07/20  Donato Schultz, DO  nitroGLYCERIN (NITRO-DUR) 0.2 mg/hr patch Apply 1/4 of a patch to skin once daily. 06/07/20 12/07/20  Judi Saa, DO    Family History Family History  Problem Relation Age of Onset  . Breast cancer Mother   . Hypertension Mother   . Stroke Mother   . Pancreatitis Father     Social History Social History   Tobacco Use  . Smoking status: Never Smoker  . Smokeless tobacco: Never Used  Vaping Use  . Vaping Use: Never used  Substance Use Topics  . Alcohol use: Yes    Comment: rare  . Drug use: No     Allergies   Sulfonamide derivatives and Penicillins   Review of Systems Review of Systems  Constitutional: Negative for activity change, appetite change, chills, fatigue and fever.  HENT: Positive for congestion, rhinorrhea, sinus pressure and sore throat. Negative for ear pain and trouble swallowing.   Eyes: Negative for discharge and redness.  Respiratory: Positive for cough. Negative for chest tightness and shortness of breath.   Cardiovascular: Negative for chest pain.  Gastrointestinal: Negative for abdominal pain, diarrhea, nausea and vomiting.  Musculoskeletal: Negative for myalgias.  Skin: Negative for rash.  Neurological: Negative for dizziness, light-headedness and  headaches.     Physical Exam Triage Vital Signs ED Triage Vitals  Enc Vitals Group     BP 12/07/20 1843 (!) 143/77     Pulse Rate 12/07/20 1843 71     Resp 12/07/20 1843 15     Temp 12/07/20 1843 97.9 F (36.6 C)     Temp Source 12/07/20 1843 Oral     SpO2 12/07/20 1843 98 %     Weight --      Height --      Head Circumference --      Peak Flow --      Pain Score 12/07/20 1841 8     Pain Loc --      Pain Edu? --      Excl. in GC? --    No data found.  Updated Vital Signs BP (!)  143/77 (BP Location: Left Arm)   Pulse 71   Temp 97.9 F (36.6 C) (Oral)   Resp 15   SpO2 98%   Visual Acuity Right Eye Distance:   Left Eye Distance:   Bilateral Distance:    Right Eye Near:   Left Eye Near:    Bilateral Near:     Physical Exam Vitals and nursing note reviewed.  Constitutional:      Appearance: She is well-developed.     Comments: No acute distress  HENT:     Head: Normocephalic and atraumatic.     Ears:     Comments: Bilateral ears without tenderness to palpation of external auricle, tragus and mastoid, EAC's without erythema or swelling, TM's with good bony landmarks and cone of light. Non erythematous.     Nose: Nose normal.     Mouth/Throat:     Comments: Oral mucosa pink and moist, no tonsillar enlargement or exudate. Posterior pharynx patent and nonerythematous, no uvula deviation or swelling. Normal phonation. Eyes:     Conjunctiva/sclera: Conjunctivae normal.  Cardiovascular:     Rate and Rhythm: Normal rate.  Pulmonary:     Effort: Pulmonary effort is normal. No respiratory distress.     Comments: Breathing comfortably at rest, CTABL, no wheezing, rales or other adventitious sounds auscultated Abdominal:     General: There is no distension.  Musculoskeletal:        General: Normal range of motion.     Cervical back: Neck supple.  Skin:    General: Skin is warm and dry.  Neurological:     Mental Status: She is alert and oriented to person, place, and time.      UC Treatments / Results  Labs (all labs ordered are listed, but only abnormal results are displayed) Labs Reviewed - No data to display  EKG   Radiology No results found.  Procedures Procedures (including critical care time)  Medications Ordered in UC Medications - No data to display  Initial Impression / Assessment and Plan / UC Course  I have reviewed the triage vital signs and the nursing notes.  Pertinent labs & imaging results that were available during my care  of the patient were reviewed by me and considered in my medical decision making (see chart for details).     Treating for sinusitis- has penicillin allergy, will try Omnicef as alternative along with course of prednisone, daily antihistamine rest and fluids.  Discussed strict return precautions. Patient verbalized understanding and is agreeable with plan.  Final Clinical Impressions(s) / UC Diagnoses   Final diagnoses:  Acute sinusitis with symptoms > 10 days  Discharge Instructions     Omnicef twice daily for 1 week Prednisone daily for 5 days-take with food and earlier in the day if possible May use over-the-counter cetirizine/Zyrtec or loratadine/Claritin to further help with congestion and postnasal drainage Rest and fluids Follow-up if not improving or worsening   ED Prescriptions    Medication Sig Dispense Auth. Provider   cefdinir (OMNICEF) 300 MG capsule Take 1 capsule (300 mg total) by mouth 2 (two) times daily for 7 days. 14 capsule Lorne Winkels C, PA-C   predniSONE (DELTASONE) 20 MG tablet Take 2 tablets (40 mg total) by mouth daily with breakfast for 5 days. 10 tablet Zariya Minner, [place] C, PA-C     PDMP not reviewed this encounter.   Lew Dawes, [place] 12/08/20 [phone]

## 2020-12-26 DIAGNOSIS — D2272 Melanocytic nevi of left lower limb, including hip: Secondary | ICD-10-CM | POA: Diagnosis not present

## 2020-12-26 DIAGNOSIS — L821 Other seborrheic keratosis: Secondary | ICD-10-CM | POA: Diagnosis not present

## 2020-12-26 DIAGNOSIS — D485 Neoplasm of uncertain behavior of skin: Secondary | ICD-10-CM | POA: Diagnosis not present

## 2020-12-26 DIAGNOSIS — L814 Other melanin hyperpigmentation: Secondary | ICD-10-CM | POA: Diagnosis not present

## 2020-12-26 DIAGNOSIS — D225 Melanocytic nevi of trunk: Secondary | ICD-10-CM | POA: Diagnosis not present

## 2020-12-26 DIAGNOSIS — D1801 Hemangioma of skin and subcutaneous tissue: Secondary | ICD-10-CM | POA: Diagnosis not present

## 2021-02-05 DIAGNOSIS — Z6828 Body mass index (BMI) 28.0-28.9, adult: Secondary | ICD-10-CM | POA: Diagnosis not present

## 2021-02-05 DIAGNOSIS — Z1231 Encounter for screening mammogram for malignant neoplasm of breast: Secondary | ICD-10-CM | POA: Diagnosis not present

## 2021-02-05 DIAGNOSIS — Z01419 Encounter for gynecological examination (general) (routine) without abnormal findings: Secondary | ICD-10-CM | POA: Diagnosis not present

## 2021-02-05 LAB — HM MAMMOGRAPHY

## 2021-02-05 LAB — HM PAP SMEAR: HM Pap smear: NORMAL

## 2021-07-11 ENCOUNTER — Encounter: Payer: BLUE CROSS/BLUE SHIELD | Admitting: Family Medicine

## 2021-07-18 NOTE — Progress Notes (Signed)
[facility] [address] [place] [address] Phone: [phone] Subjective:   [name], am serving as a scribe for Dr. Antoine Primas.  This visit occurred during the SARS-CoV-2 public health emergency.  Safety protocols were in place, including screening questions prior to the visit, additional usage of staff PPE, and extensive cleaning of exam room while observing appropriate contact time as indicated for disinfecting solutions.    I'm seeing this patient by the request  of:  Donato Schultz, DO  CC: left wrist pain   [id number]  08/22/2020 Patient on ultrasound still has some neovascularization and some mild hypoechoic changes of the common extensor tear.  Patient is feeling 95% better than he at this time.  He would impact, patient has discontinued the compression already at this time.  We will see if patient does relatively well or if worsening pain I would encourage patient to have an MRI and then we will discuss the possibility of PRP.  Patient will have a follow-up again in 2 to 3 months otherwise and in the interim continue to avoid any overhead lifting.  Updated 07/23/2021 [name] is a 65 y.o. female coming in with complaint of left wrist pain. Patient drank a Coke after Thanksgiving and she felt like her wrist pain increased with this grip. She went to bed and woke up with tingling in thumb 2nd and 3rd fingers. This occurred multiple other nights. Is using wrist brace to be able to sleep at night. Last night she did not use brace and she did not have tingling today. Patient also notes a "catch" in middle deltoid which made it hard to put in deodorant but this pain has subsided.     Past Medical History:  Diagnosis Date   Allergic rhinitis    Allergy    Past Surgical History:  Procedure Laterality Date   BREAST BIOPSY  11-2000   benign   OOPHORECTOMY     VAGINAL HYSTERECTOMY     Social History   Socioeconomic  History   Marital status: Married    Spouse name: Not on file   Number of children: 1   Years of education: Not on file   Highest education level: Not on file  Occupational History   Occupation: investment banking  Tobacco Use   Smoking status: Never   Smokeless tobacco: Never  Vaping Use   Vaping Use: Never used  Substance and Sexual Activity   Alcohol use: Yes    Comment: rare   Drug use: No   Sexual activity: Yes    Partners: Male  Other Topics Concern   Not on file  Social History Narrative   Exercise--no   Social Determinants of Health   Financial Resource Strain: Not on file  Food Insecurity: Not on file  Transportation Needs: Not on file  Physical Activity: Not on file  Stress: Not on file  Social Connections: Not on file   Allergies  Allergen Reactions   Sulfonamide Derivatives Swelling   Penicillins Rash   Family History  Problem Relation Age of Onset   Breast cancer Mother    Hypertension Mother    Stroke Mother    Pancreatitis Father          Current Outpatient Medications (Other):    Estriol POWD,    gabapentin (NEURONTIN) 100 MG capsule, Take 2 capsules (200 mg total) by mouth at bedtime.   Reviewed prior external information including notes and imaging  from  primary care provider As well as notes that were available from care everywhere and other healthcare systems.  Past medical history, social, surgical and family history all reviewed in electronic medical record.  No pertanent information unless stated regarding to the chief complaint.   Review of Systems:  No headache, visual changes, nausea, vomiting, diarrhea, constipation, dizziness, abdominal pain, skin rash, fevers, chills, night sweats, weight loss, swollen lymph nodes, body aches, joint swelling, chest pain, shortness of breath, mood changes. POSITIVE muscle aches  Objective  Blood pressure 116/74, pulse 89, height 5\' 4"  (1.626 m), weight 163 lb (73.9 kg), SpO2 99 %.    General: No apparent distress alert and oriented x3 mood and affect normal, dressed appropriately.  HEENT: Pupils equal, extraocular movements intact  Respiratory: Patient's speak in full sentences and does not appear short of breath  Cardiovascular: No lower extremity edema, non tender, no erythema  Gait normal with good balance and coordination.  MSK: Left wrist exam shows the patient does have some positive Tinel sign noted.  Patient does have positive Phalen sign.  No thenar eminence wasting.  Good grip strength.  Neurovascular exam good range of motion.  Limited muscular skeletal ultrasound was performed and interpreted by , M  Limited ultrasound shows the patient does have dilation noted with hypoechoic changes of the median nerve consistent with carpal tunnel.  No other significant findings noted. Impression: Carpal tunnel syndrome  Procedure: Real-time Ultrasound Guided Injection of left carpal tunnel Device: GE Logiq Q7 Ultrasound guided injection is preferred based studies that show increased duration, increased effect, greater accuracy, decreased procedural pain, increased response rate with ultrasound guided versus blind injection.  Verbal informed consent obtained.  Time-out conducted.  Noted no overlying erythema, induration, or other signs of local infection.  Skin prepped in a sterile fashion.  Local anesthesia: Topical Ethyl chloride.  With sterile technique and under real time ultrasound guidance:  median nerve visualized.  23g 5/8 inch needle inserted distal to proximal approach into nerve sheath. Pictures taken nfor needle placement. Patient did have injection of 0.5 cc of 0.5% Marcaine and 0.5 cc of Kenalog 40 mg/mL Completed without difficulty  Pain immediately resolved suggesting accurate placement of the medication.  Advised to call if fevers/chills, erythema, induration, drainage, or persistent bleeding.  Impression: Technically successful ultrasound  guided injection.    Impression and Recommendations:     The above documentation has been reviewed and is accurate and complete 7/8, DO

## 2021-07-23 ENCOUNTER — Other Ambulatory Visit: Payer: Self-pay

## 2021-07-23 ENCOUNTER — Ambulatory Visit (INDEPENDENT_AMBULATORY_CARE_PROVIDER_SITE_OTHER): Payer: BC Managed Care – PPO | Admitting: Family Medicine

## 2021-07-23 ENCOUNTER — Ambulatory Visit: Payer: Self-pay

## 2021-07-23 ENCOUNTER — Encounter: Payer: Self-pay | Admitting: Family Medicine

## 2021-07-23 VITALS — BP 116/74 | HR 89 | Ht 64.0 in | Wt 163.0 lb

## 2021-07-23 DIAGNOSIS — G5602 Carpal tunnel syndrome, left upper limb: Secondary | ICD-10-CM | POA: Diagnosis not present

## 2021-07-23 DIAGNOSIS — M25532 Pain in left wrist: Secondary | ICD-10-CM | POA: Diagnosis not present

## 2021-07-23 MED ORDER — GABAPENTIN 100 MG PO CAPS: 200.0000 mg | ORAL_CAPSULE | Freq: Every day | ORAL | 0 refills | Status: DC

## 2021-07-23 NOTE — Assessment & Plan Note (Signed)
We discussed the anatomy involved, and that carpal tunnel syndrome primarily involves the median nerve, and this typically affects digits one through 3.   We also discussed that mild cases of carpal tunnel syndrome are often improved with night splints.  If symptoms persist it is very reasonable to consider a carpal tunnel injection.   If the patient does have moderate to severe carpal tunnel syndrome based on NCV, then it is certainly reasonable to consider surgical consultation for definitive management possible carpal tunnel release.  We also discussed his severe carpal tunnel syndrome can lead to permanent nerve impairment even if released injection given today as well.  Worsening pain consider formal physical therapy and nerve conduction study.

## 2021-07-23 NOTE — Patient Instructions (Addendum)
Do prescribed exercises at least 3x a week Gabapentin 200mg  Brace at night See you again in 5-6 weeks

## 2021-08-26 NOTE — Progress Notes (Signed)
[place] Phone: [phone] Subjective:   [name], am serving as a scribe for Dr. Hulan Saas. This visit occurred during the SARS-CoV-2 public health emergency.  Safety protocols were in place, including screening questions prior to the visit, additional usage of staff PPE, and extensive cleaning of exam room while observing appropriate contact time as indicated for disinfecting solutions.  I'm seeing this patient by the request  of:  Ann Held, DO  CC: Left wrist pain  [id number]  07/23/2021 We discussed the anatomy involved, and that carpal tunnel syndrome primarily involves the median nerve, and this typically affects digits one through 3.    We also discussed that mild cases of carpal tunnel syndrome are often improved with night splints.  If symptoms persist it is very reasonable to consider a carpal tunnel injection.    If the patient does have moderate to severe carpal tunnel syndrome based on NCV, then it is certainly reasonable to consider surgical consultation for definitive management possible carpal tunnel release.  We also discussed his severe carpal tunnel syndrome can lead to permanent nerve impairment even if released injection given today as well.  Worsening pain consider formal physical therapy and nerve conduction study.  Updated 08/27/2021 [name] is a 66 y.o. female coming in with complaint of left wrist pain found to have more of a carpal tunnel syndrome.  Patient is to do conservative therapy with bracing, home exercises.  Patient states overall doing much better.  Would state that she is feeling 100% better with her wrist pain at the moment.       Past Medical History:  Diagnosis Date   Allergic rhinitis    Allergy    Past Surgical History:  Procedure Laterality Date   BREAST BIOPSY  11-2000   benign   OOPHORECTOMY     VAGINAL HYSTERECTOMY     Social  History   Socioeconomic History   Marital status: Married    Spouse name: Not on file   Number of children: 1   Years of education: Not on file   Highest education level: Not on file  Occupational History   Occupation: investment banking  Tobacco Use   Smoking status: Never   Smokeless tobacco: Never  Vaping Use   Vaping Use: Never used  Substance and Sexual Activity   Alcohol use: Yes    Comment: rare   Drug use: [name]   Sexual activity: Yes    Partners: Male  Other Topics Concern   Not on file  Social History Narrative   Exercise--[name]   Social Determinants of Health   Financial Resource Strain: Not on file  Food Insecurity: Not on file  Transportation Needs: Not on file  Physical Activity: Not on file  Stress: Not on file  Social Connections: Not on file   Allergies  Allergen Reactions   Sulfonamide Derivatives Swelling   Penicillins Rash   Family History  Problem Relation Age of Onset   Breast cancer Mother    Hypertension Mother    Stroke Mother    Pancreatitis Father          Current Outpatient Medications (Other):    Estriol POWD,    gabapentin (NEURONTIN) 100 MG capsule, Take 2 capsules (200 mg total) by mouth at bedtime.   Reviewed prior external information including notes and imaging from  primary care provider As well as notes that were available from  care everywhere and other healthcare systems.  Past medical history, social, surgical and family history all reviewed in electronic medical record.  [name] pertanent information unless stated regarding to the chief complaint.   Review of Systems:  [name] headache, visual changes, nausea, vomiting, diarrhea, constipation, dizziness, abdominal pain, skin rash, fevers, chills, night sweats, weight loss, swollen lymph nodes, body aches, joint swelling, chest pain, shortness of breath, mood changes. POSITIVE muscle aches  Objective  Blood pressure 118/80, pulse 83, height 5\' 4"  (1.626 m), weight 172 lb (78  kg), SpO2 99 %.   General: [name] apparent distress alert and oriented x3 mood and affect normal, dressed appropriately.  HEENT: Pupils equal, extraocular movements intact  Respiratory: Patient's speak in full sentences and does not appear short of breath  Cardiovascular: [name] lower extremity edema, non tender, [name] erythema  Gait normal with good balance and coordination.  MSK: Wrist exam has very mild discomfort is noted on exam today with Tinel's.  Improvement in grip strength also noted.  Good range of motion.  Limited muscular skeletal ultrasound was performed and interpreted by Hulan Saas, M  Limited ultrasound of patient's left wrist shows the patient still has some mild enlargement noted of the median nerve.  Improvement are noted from previous exam. Impression: Interval improvement    Impression and Recommendations:    The above documentation has been reviewed and is accurate and complete Lyndal Pulley, DO

## 2021-08-27 ENCOUNTER — Encounter: Payer: Self-pay | Admitting: Family Medicine

## 2021-08-27 ENCOUNTER — Other Ambulatory Visit: Payer: Self-pay

## 2021-08-27 ENCOUNTER — Ambulatory Visit: Payer: Self-pay

## 2021-08-27 ENCOUNTER — Ambulatory Visit (INDEPENDENT_AMBULATORY_CARE_PROVIDER_SITE_OTHER): Payer: BC Managed Care – PPO | Admitting: Family Medicine

## 2021-08-27 VITALS — BP 118/80 | HR 83 | Ht 64.0 in | Wt 172.0 lb

## 2021-08-27 DIAGNOSIS — G5602 Carpal tunnel syndrome, left upper limb: Secondary | ICD-10-CM

## 2021-08-27 DIAGNOSIS — M25532 Pain in left wrist: Secondary | ICD-10-CM | POA: Diagnosis not present

## 2021-08-27 NOTE — Patient Instructions (Signed)
Try 100mg  gabapentin at night for to 2 weeks Brace at night for a month to be safe See me when you need me

## 2021-08-27 NOTE — Assessment & Plan Note (Signed)
Patient is doing much better at this point.  The patient as long as does well can follow-up as needed.  We discussed some gabapentin to decrease to 100 mg and see how patient responds and then can discontinue afterwards.  Worsening pain will come back and can always consider injections.

## 2021-08-29 ENCOUNTER — Ambulatory Visit (INDEPENDENT_AMBULATORY_CARE_PROVIDER_SITE_OTHER): Payer: BC Managed Care – PPO | Admitting: Family Medicine

## 2021-08-29 ENCOUNTER — Encounter: Payer: Self-pay | Admitting: Family Medicine

## 2021-08-29 VITALS — BP 110/80 | HR 70 | Temp 97.8°F | Resp 18 | Ht 64.0 in | Wt 174.6 lb

## 2021-08-29 DIAGNOSIS — Z0000 Encounter for general adult medical examination without abnormal findings: Secondary | ICD-10-CM | POA: Diagnosis not present

## 2021-08-29 LAB — CBC WITH DIFFERENTIAL/PLATELET
Basophils Absolute: 0 10*3/uL (ref 0.0–0.1)
Basophils Relative: 0.6 % (ref 0.0–3.0)
Eosinophils Absolute: 0.1 10*3/uL (ref 0.0–0.7)
Eosinophils Relative: 2.8 % (ref 0.0–5.0)
HCT: 38.7 % (ref 36.0–46.0)
Hemoglobin: 13 g/dL (ref 12.0–15.0)
Lymphocytes Relative: 48.9 % — ABNORMAL HIGH (ref 12.0–46.0)
Lymphs Abs: 1.3 10*3/uL (ref 0.7–4.0)
MCHC: 33.5 g/dL (ref 30.0–36.0)
MCV: 95.4 fl (ref 78.0–100.0)
Monocytes Absolute: 0.3 10*3/uL (ref 0.1–1.0)
Monocytes Relative: 12.2 % — ABNORMAL HIGH (ref 3.0–12.0)
Neutro Abs: 1 10*3/uL — ABNORMAL LOW (ref 1.4–7.7)
Neutrophils Relative %: 35.5 % — ABNORMAL LOW (ref 43.0–77.0)
Platelets: 211 10*3/uL (ref 150.0–400.0)
RBC: 4.05 Mil/uL (ref 3.87–5.11)
RDW: 13.4 % (ref 11.5–15.5)
WBC: 2.7 10*3/uL — ABNORMAL LOW (ref 4.0–10.5)

## 2021-08-29 LAB — COMPREHENSIVE METABOLIC PANEL WITH GFR
ALT: 14 U/L (ref 0–35)
AST: 16 U/L (ref 0–37)
Albumin: 3.8 g/dL (ref 3.5–5.2)
Alkaline Phosphatase: 52 U/L (ref 39–117)
BUN: 19 mg/dL (ref 6–23)
CO2: 30 meq/L (ref 19–32)
Calcium: 8.7 mg/dL (ref 8.4–10.5)
Chloride: 106 meq/L (ref 96–112)
Creatinine, Ser: 0.75 mg/dL (ref 0.40–1.20)
GFR: 83.85 mL/min
Glucose, Bld: 83 mg/dL (ref 70–99)
Potassium: 4.6 meq/L (ref 3.5–5.1)
Sodium: 140 meq/L (ref 135–145)
Total Bilirubin: 0.5 mg/dL (ref 0.2–1.2)
Total Protein: 6.3 g/dL (ref 6.0–8.3)

## 2021-08-29 LAB — LIPID PANEL
Cholesterol: 162 mg/dL (ref 0–200)
HDL: 63.9 mg/dL
LDL Cholesterol: 85 mg/dL (ref 0–99)
NonHDL: 97.62
Total CHOL/HDL Ratio: 3
Triglycerides: 64 mg/dL (ref 0.0–149.0)
VLDL: 12.8 mg/dL (ref 0.0–40.0)

## 2021-08-29 LAB — TSH: TSH: 1.57 u[IU]/mL (ref 0.35–5.50)

## 2021-08-29 NOTE — Patient Instructions (Signed)

## 2021-08-29 NOTE — Progress Notes (Signed)
Subjective:     [name] is a 66 y.o. female and is here for a comprehensive physical exam. The patient reports no problems.  Social History   Socioeconomic History   Marital status: Married    Spouse name: Not on file   Number of children: 1   Years of education: Not on file   Highest education level: Not on file  Occupational History   Occupation: investment banking  Tobacco Use   Smoking status: Never   Smokeless tobacco: Never  Vaping Use   Vaping Use: Never used  Substance and Sexual Activity   Alcohol use: Yes    Comment: rare   Drug use: No   Sexual activity: Yes    Partners: Male  Other Topics Concern   Not on file  Social History Narrative   Exercise--no   Social Determinants of Health   Financial Resource Strain: Not on file  Food Insecurity: Not on file  Transportation Needs: Not on file  Physical Activity: Not on file  Stress: Not on file  Social Connections: Not on file  Intimate Partner Violence: Not on file   Health Maintenance  Topic Date Due   MAMMOGRAM  02/05/2022   COLONOSCOPY (Pts 45-6yrs Insurance coverage will need to be confirmed)  11/18/2022   TETANUS/TDAP  04/03/2027   INFLUENZA VACCINE  Completed   COVID-19 Vaccine  Completed   Hepatitis C Screening  Completed   HIV Screening  Completed   Zoster Vaccines- Shingrix  Completed   Pneumococcal Vaccine 78-91 Years old  Aged Out   HPV VACCINES  Aged Out   PAP SMEAR-Modifier  Discontinued    The following portions of the patient's history were reviewed and updated as appropriate: She  has a past medical history of Allergic rhinitis and Allergy. She does not have any pertinent problems on file. She  has a past surgical history that includes Breast biopsy (11-2000); Vaginal hysterectomy; and Oophorectomy. Her family history includes Breast cancer in her mother; Hypertension in her mother; Pancreatitis in her father; Stroke in her mother. She  reports that she has never smoked. She has  never used smokeless tobacco. She reports current alcohol use. She reports that she does not use drugs. She has a current medication list which includes the following prescription(s): estriol, gabapentin, [DISCONTINUED] fluticasone, and [DISCONTINUED] nitroglycerin. Current Outpatient Medications on File Prior to Visit  Medication Sig Dispense Refill   Estriol POWD      gabapentin (NEURONTIN) 100 MG capsule Take 2 capsules (200 mg total) by mouth at bedtime. (Patient taking differently: Take 100 mg by mouth at bedtime.) 180 capsule 0   [DISCONTINUED] fluticasone (FLONASE) 50 MCG/ACT nasal spray Place 2 sprays into both nostrils daily. 16 g 6   [DISCONTINUED] nitroGLYCERIN (NITRO-DUR) 0.2 mg/hr patch Apply 1/4 of a patch to skin once daily. 30 patch 0   No current facility-administered medications on file prior to visit.   She is allergic to sulfonamide derivatives and penicillins..  Review of Systems Review of Systems  Constitutional: Negative for activity change, appetite change and fatigue.  HENT: Negative for hearing loss, congestion, tinnitus and ear discharge.  dentist q44m Eyes: Negative for visual disturbance (see optho q1y -- vision corrected to 20/20 with glasses).  Respiratory: Negative for cough, chest tightness and shortness of breath.   Cardiovascular: Negative for chest pain, palpitations and leg swelling.  Gastrointestinal: Negative for abdominal pain, diarrhea, constipation and abdominal distention.  Genitourinary: Negative for urgency, frequency, decreased urine volume and  difficulty urinating.  Musculoskeletal: Negative for back pain, arthralgias and gait problem.  Skin: Negative for color change, pallor and rash.  Neurological: Negative for dizziness, light-headedness, numbness and headaches.  Hematological: Negative for adenopathy. Does not bruise/bleed easily.  Psychiatric/Behavioral: Negative for suicidal ideas, confusion, sleep disturbance, self-injury, dysphoric mood,  decreased concentration and agitation.      Objective:    BP 110/80 (BP Location: Left Arm, Patient Position: Sitting, Cuff Size: Normal)    Pulse 70    Temp 97.8 F (36.6 C) (Oral)    Resp 18    Ht 5\' 4"  (1.626 m)    Wt 174 lb 9.6 oz (79.2 kg)    SpO2 99%    BMI 29.97 kg/m  General appearance: alert, cooperative, appears stated age, and no distress Head: Normocephalic, without obvious abnormality, atraumatic Eyes: conjunctivae/corneas clear. PERRL, EOM's intact. Fundi benign. Ears: normal TM's and external ear canals both ears Nose: Nares normal. Septum midline. Mucosa normal. No drainage or sinus tenderness. Neck: no adenopathy, no carotid bruit, no JVD, supple, symmetrical, trachea midline, and thyroid not enlarged, symmetric, no tenderness/mass/nodules Back: symmetric, no curvature. ROM normal. No CVA tenderness. Lungs: clear to auscultation bilaterally Heart: regular rate and rhythm, S1, S2 normal, no murmur, click, rub or gallop Abdomen: soft, non-tender; bowel sounds normal; no masses,  no organomegaly Extremities: extremities normal, atraumatic, no cyanosis or edema Pulses: 2+ and symmetric Skin: Skin color, texture, turgor normal. No rashes or lesions Lymph nodes: Cervical, supraclavicular, and axillary nodes normal. Neurologic: Alert and oriented X 3, normal strength and tone. Normal symmetric reflexes. Normal coordination and gait    Assessment:    Healthy female exam.      Plan:  Ghm utd Check labs    See After Visit Summary for Counseling Recommendations   1. Preventative health care See above - CBC with Differential/Platelet - Comprehensive metabolic panel - Lipid panel - TSH Acp --- pt has one and will bring it in

## 2021-09-03 ENCOUNTER — Other Ambulatory Visit: Payer: Self-pay | Admitting: Family Medicine

## 2021-09-03 DIAGNOSIS — D729 Disorder of white blood cells, unspecified: Secondary | ICD-10-CM

## 2021-10-08 ENCOUNTER — Other Ambulatory Visit (INDEPENDENT_AMBULATORY_CARE_PROVIDER_SITE_OTHER): Payer: Medicare Other

## 2021-10-08 DIAGNOSIS — H5213 Myopia, bilateral: Secondary | ICD-10-CM | POA: Diagnosis not present

## 2021-10-08 DIAGNOSIS — D729 Disorder of white blood cells, unspecified: Secondary | ICD-10-CM

## 2021-10-08 LAB — CBC WITH DIFFERENTIAL/PLATELET
Basophils Absolute: 0 10*3/uL (ref 0.0–0.1)
Basophils Relative: 0.4 % (ref 0.0–3.0)
Eosinophils Absolute: 0.1 10*3/uL (ref 0.0–0.7)
Eosinophils Relative: 3.7 % (ref 0.0–5.0)
HCT: 40.2 % (ref 36.0–46.0)
Hemoglobin: 13.5 g/dL (ref 12.0–15.0)
Lymphocytes Relative: 41.8 % (ref 12.0–46.0)
Lymphs Abs: 1.3 10*3/uL (ref 0.7–4.0)
MCHC: 33.5 g/dL (ref 30.0–36.0)
MCV: 95.7 fl (ref 78.0–100.0)
Monocytes Absolute: 0.3 10*3/uL (ref 0.1–1.0)
Monocytes Relative: 11.4 % (ref 3.0–12.0)
Neutro Abs: 1.3 10*3/uL — ABNORMAL LOW (ref 1.4–7.7)
Neutrophils Relative %: 42.7 % — ABNORMAL LOW (ref 43.0–77.0)
Platelets: 200 10*3/uL (ref 150.0–400.0)
RBC: 4.2 Mil/uL (ref 3.87–5.11)
RDW: 13.6 % (ref 11.5–15.5)
WBC: 3 10*3/uL — ABNORMAL LOW (ref 4.0–10.5)

## 2021-10-16 ENCOUNTER — Telehealth: Payer: Self-pay | Admitting: Family Medicine

## 2021-10-16 NOTE — Telephone Encounter (Signed)
Pt would like to have previous labs redone  ? ?No active lab requests  ?Please advise    ?

## 2021-10-16 NOTE — Telephone Encounter (Signed)
Pt is wanting to repeat labs and you advised to recheck in 3 months. Okay to order? ?

## 2021-10-17 ENCOUNTER — Other Ambulatory Visit: Payer: Self-pay | Admitting: Family Medicine

## 2021-10-17 DIAGNOSIS — D729 Disorder of white blood cells, unspecified: Secondary | ICD-10-CM

## 2021-10-17 NOTE — Telephone Encounter (Signed)
Labs ordered. Pt needs lab appointment please ?

## 2021-12-10 ENCOUNTER — Other Ambulatory Visit (INDEPENDENT_AMBULATORY_CARE_PROVIDER_SITE_OTHER): Payer: Medicare Other

## 2021-12-10 DIAGNOSIS — D729 Disorder of white blood cells, unspecified: Secondary | ICD-10-CM

## 2021-12-10 LAB — CBC WITH DIFFERENTIAL/PLATELET
Basophils Absolute: 0 10*3/uL (ref 0.0–0.1)
Basophils Relative: 0.6 % (ref 0.0–3.0)
Eosinophils Absolute: 0.1 10*3/uL (ref 0.0–0.7)
Eosinophils Relative: 3 % (ref 0.0–5.0)
HCT: 39.3 % (ref 36.0–46.0)
Hemoglobin: 13.4 g/dL (ref 12.0–15.0)
Lymphocytes Relative: 43.9 % (ref 12.0–46.0)
Lymphs Abs: 1.3 10*3/uL (ref 0.7–4.0)
MCHC: 34.2 g/dL (ref 30.0–36.0)
MCV: 95.4 fl (ref 78.0–100.0)
Monocytes Absolute: 0.3 10*3/uL (ref 0.1–1.0)
Monocytes Relative: 10.8 % (ref 3.0–12.0)
Neutro Abs: 1.3 10*3/uL — ABNORMAL LOW (ref 1.4–7.7)
Neutrophils Relative %: 41.7 % — ABNORMAL LOW (ref 43.0–77.0)
Platelets: 205 10*3/uL (ref 150.0–400.0)
RBC: 4.12 Mil/uL (ref 3.87–5.11)
RDW: 13.1 % (ref 11.5–15.5)
WBC: 3.1 10*3/uL — ABNORMAL LOW (ref 4.0–10.5)

## 2021-12-25 ENCOUNTER — Telehealth: Payer: Self-pay

## 2021-12-25 NOTE — Telephone Encounter (Signed)
error 

## 2022-01-07 ENCOUNTER — Ambulatory Visit: Payer: Medicare Other | Admitting: Family Medicine

## 2022-01-21 ENCOUNTER — Other Ambulatory Visit: Payer: Self-pay

## 2022-01-21 ENCOUNTER — Encounter: Payer: Self-pay | Admitting: Emergency Medicine

## 2022-01-21 ENCOUNTER — Ambulatory Visit
Admission: EM | Admit: 2022-01-21 | Discharge: 2022-01-21 | Disposition: A | Discharge status: Home / Self Care | Payer: Medicare Other | Attending: Internal Medicine | Admitting: Internal Medicine

## 2022-01-21 DIAGNOSIS — R350 Frequency of micturition: Secondary | ICD-10-CM | POA: Diagnosis not present

## 2022-01-21 DIAGNOSIS — R300 Dysuria: Secondary | ICD-10-CM | POA: Insufficient documentation

## 2022-01-21 DIAGNOSIS — N3001 Acute cystitis with hematuria: Secondary | ICD-10-CM

## 2022-01-21 LAB — POCT URINALYSIS DIP (MANUAL ENTRY)
Bilirubin, UA: NEGATIVE
Glucose, UA: NEGATIVE mg/dL
Ketones, POC UA: NEGATIVE mg/dL
Nitrite, UA: NEGATIVE
Protein Ur, POC: 30 mg/dL — AB
Spec Grav, UA: 1.025 (ref 1.010–1.025)
Urobilinogen, UA: 0.2 E.U./dL
pH, UA: 5.5 (ref 5.0–8.0)

## 2022-01-21 MED ORDER — CEPHALEXIN 500 MG PO CAPS: 500.0000 mg | ORAL_CAPSULE | Freq: Four times a day (QID) | ORAL | 0 refills | Status: DC

## 2022-01-21 NOTE — ED Triage Notes (Signed)
Pt here for dysuria x 3 days; pt sts took azo last night

## 2022-01-21 NOTE — Discharge Instructions (Signed)
You have a urinary tract infection which is being treated with an antibiotic.  Urine culture is pending.  We will call if it is abnormal. Please follow-up if symptoms persist or worsen. 

## 2022-01-21 NOTE — ED Provider Notes (Addendum)
[facility]    CSN: [id number] Arrival date & time: 01/21/22  1604      History   Chief Complaint Chief Complaint  Patient presents with   Dysuria    HPI [name] is a 66 y.o. female.   Patient presents with urinary burning, urinary frequency, and mild lower abdominal cramping that has been present for approximately 3 days.  Patient took Azo for symptoms with minimal improvement.  Denies vaginal discharge, hematuria, back pain, fever, abnormal vaginal bleeding.   Dysuria   Past Medical History:  Diagnosis Date   Allergic rhinitis    Allergy     Patient Active Problem List   Diagnosis Date Noted   Left carpal tunnel syndrome 07/23/2021   Partial tear of common extensor tendon of elbow 06/07/2020   Leukopenia 05/30/2019   AC joint pain 04/04/2016   Calcific bursitis of shoulder 04/04/2016   Right arm pain 11/22/2015   Sinusitis, acute maxillary 08/02/2014   THYROIDITIS 03/14/2010   ABNORMAL THYROID FUNCTION TESTS 02/27/2010   NUMBNESS, HAND 02/12/2010   EDEMA 02/12/2010   VAGINAL BLEEDING, ABNORMAL 12/14/2008   IRREGULAR MENSES 12/04/2008   ALLERGIC RHINITIS 06/13/2008   UTI 02/18/2008   SINUSITIS- ACUTE-NOS 06/25/2007   PHARYNGITIS, ACUTE 02/16/2007   FOREIGN BODY, FOOT/TOE W/O INFECTION 02/16/2007    Past Surgical History:  Procedure Laterality Date   BREAST BIOPSY  11-2000   benign   OOPHORECTOMY     VAGINAL HYSTERECTOMY      OB History   No obstetric history on file.      Home Medications    Prior to Admission medications   Medication Sig Start Date End Date Taking? Authorizing Provider  cephALEXin (KEFLEX) 500 MG capsule Take 1 capsule (500 mg total) by mouth 4 (four) times daily. 01/21/22  Yes Gustavus BryantMound, Haynes Giannotti E, [place]  Estriol POWD  07/26/13   Marcelle OverlieGrewal, Michelle, MD  gabapentin (NEURONTIN) 100 MG capsule Take 2 capsules (200 mg total) by mouth at bedtime. Patient taking differently: Take 100 mg by mouth at bedtime. 07/23/21    Judi SaaSmith, Zachary M, DO  fluticasone (FLONASE) 50 MCG/ACT nasal spray Place 2 sprays into both nostrils daily. 03/27/20 12/07/20  Donato SchultzLowne Chase, Yvonne R, DO  nitroGLYCERIN (NITRO-DUR) 0.2 mg/hr patch Apply 1/4 of a patch to skin once daily. 06/07/20 12/07/20  Judi SaaSmith, Zachary M, DO    Family History Family History  Problem Relation Age of Onset   Breast cancer Mother    Hypertension Mother    Stroke Mother    Pancreatitis Father     Social History Social History   Tobacco Use   Smoking status: Never   Smokeless tobacco: Never  Vaping Use   Vaping Use: Never used  Substance Use Topics   Alcohol use: Yes    Comment: rare   Drug use: No     Allergies   Sulfonamide derivatives and Penicillins   Review of Systems Review of Systems Per HPI  Physical Exam Triage Vital Signs ED Triage Vitals [01/21/22 1623]  Enc Vitals Group     BP 133/79     Pulse Rate 89     Resp 18     Temp 98.2 F (36.8 C)     Temp Source Oral     SpO2 97 %     Weight      Height      Head Circumference      Peak Flow      Pain Score 4  Pain Loc      Pain Edu?      Excl. in GC?    No data found.  Updated Vital Signs BP 133/79 (BP Location: Left Arm)   Pulse 89   Temp 98.2 F (36.8 C) (Oral)   Resp 18   SpO2 97%   Visual Acuity Right Eye Distance:   Left Eye Distance:   Bilateral Distance:    Right Eye Near:   Left Eye Near:    Bilateral Near:     Physical Exam Constitutional:      General: She is not in acute distress.    Appearance: Normal appearance. She is not toxic-appearing or diaphoretic.  HENT:     Head: Normocephalic and atraumatic.  Eyes:     Extraocular Movements: Extraocular movements intact.     Conjunctiva/sclera: Conjunctivae normal.  Cardiovascular:     Rate and Rhythm: Normal rate and regular rhythm.     Pulses: Normal pulses.     Heart sounds: Normal heart sounds.  Pulmonary:     Effort: Pulmonary effort is normal. No respiratory distress.     Breath  sounds: Normal breath sounds.  Abdominal:     General: Bowel sounds are normal. There is no distension.     Palpations: Abdomen is soft.     Tenderness: There is no abdominal tenderness.  Neurological:     General: No focal deficit present.     Mental Status: She is alert and oriented to person, place, and time. Mental status is at baseline.  Psychiatric:        Mood and Affect: Mood normal.        Behavior: Behavior normal.        Thought Content: Thought content normal.        Judgment: Judgment normal.      UC Treatments / Results  Labs (all labs ordered are listed, but only abnormal results are displayed) Labs Reviewed  POCT URINALYSIS DIP (MANUAL ENTRY) - Abnormal; Notable for the following components:      Result Value   Clarity, UA cloudy (*)    Blood, UA large (*)    Protein Ur, POC =30 (*)    Leukocytes, UA Small (1+) (*)    All other components within normal limits  URINE CULTURE    EKG   Radiology No results found.  Procedures Procedures (including critical care time)  Medications Ordered in UC Medications - No data to display  Initial Impression / Assessment and Plan / UC Course  I have reviewed the triage vital signs and the nursing notes.  Pertinent labs & imaging results that were available during my care of the patient were reviewed by me and considered in my medical decision making (see chart for details).     Urinalysis indicate urinary tract infection.  Will treat with cephalexin antibiotic as patient has taken cephalosporins previously without reaction.  Urine culture pending.  Crcl is normal so normal dose of cephalexin should be safe. Advised patient to follow-up if symptoms persist or worsen.  Patient verbalized understanding and was agreeable with plan. Final Clinical Impressions(s) / UC Diagnoses   Final diagnoses:  Acute cystitis with hematuria  Dysuria  Urinary frequency     Discharge Instructions      You have a urinary tract  infection which is being treated with an antibiotic.  Urine culture is pending.  We will call if it is abnormal.  Please follow-up if symptoms persist or worsen.  ED Prescriptions     Medication Sig Dispense Auth. Provider   cephALEXin (KEFLEX) 500 MG capsule Take 1 capsule (500 mg total) by mouth 4 (four) times daily. 28 capsule Watkins, Acie Fredrickson, [place]      PDMP not reviewed this encounter.   Gustavus Bryant, [place] 01/21/22 1702    Gustavus Bryant, [place] 01/21/22 1723

## 2022-01-23 LAB — URINE CULTURE: Culture: >=100000 — AB

## 2022-02-03 DIAGNOSIS — D2271 Melanocytic nevi of right lower limb, including hip: Secondary | ICD-10-CM | POA: Diagnosis not present

## 2022-02-03 DIAGNOSIS — I788 Other diseases of capillaries: Secondary | ICD-10-CM | POA: Diagnosis not present

## 2022-02-03 DIAGNOSIS — D2261 Melanocytic nevi of right upper limb, including shoulder: Secondary | ICD-10-CM | POA: Diagnosis not present

## 2022-02-03 DIAGNOSIS — D225 Melanocytic nevi of trunk: Secondary | ICD-10-CM | POA: Diagnosis not present

## 2022-03-25 DIAGNOSIS — Z1231 Encounter for screening mammogram for malignant neoplasm of breast: Secondary | ICD-10-CM | POA: Diagnosis not present

## 2022-03-25 DIAGNOSIS — Z01419 Encounter for gynecological examination (general) (routine) without abnormal findings: Secondary | ICD-10-CM | POA: Diagnosis not present

## 2022-03-25 DIAGNOSIS — Z6830 Body mass index (BMI) 30.0-30.9, adult: Secondary | ICD-10-CM | POA: Diagnosis not present

## 2022-03-25 LAB — HM MAMMOGRAPHY

## 2022-08-25 IMAGING — DX DG ELBOW 2V*L*
2 series · 2 of 2 positions shown · non-contrast
Comparison: None.

CLINICAL DATA: Left elbow pain for 4 weeks

EXAM:
LEFT ELBOW - 2 VIEW

[elbow ap]
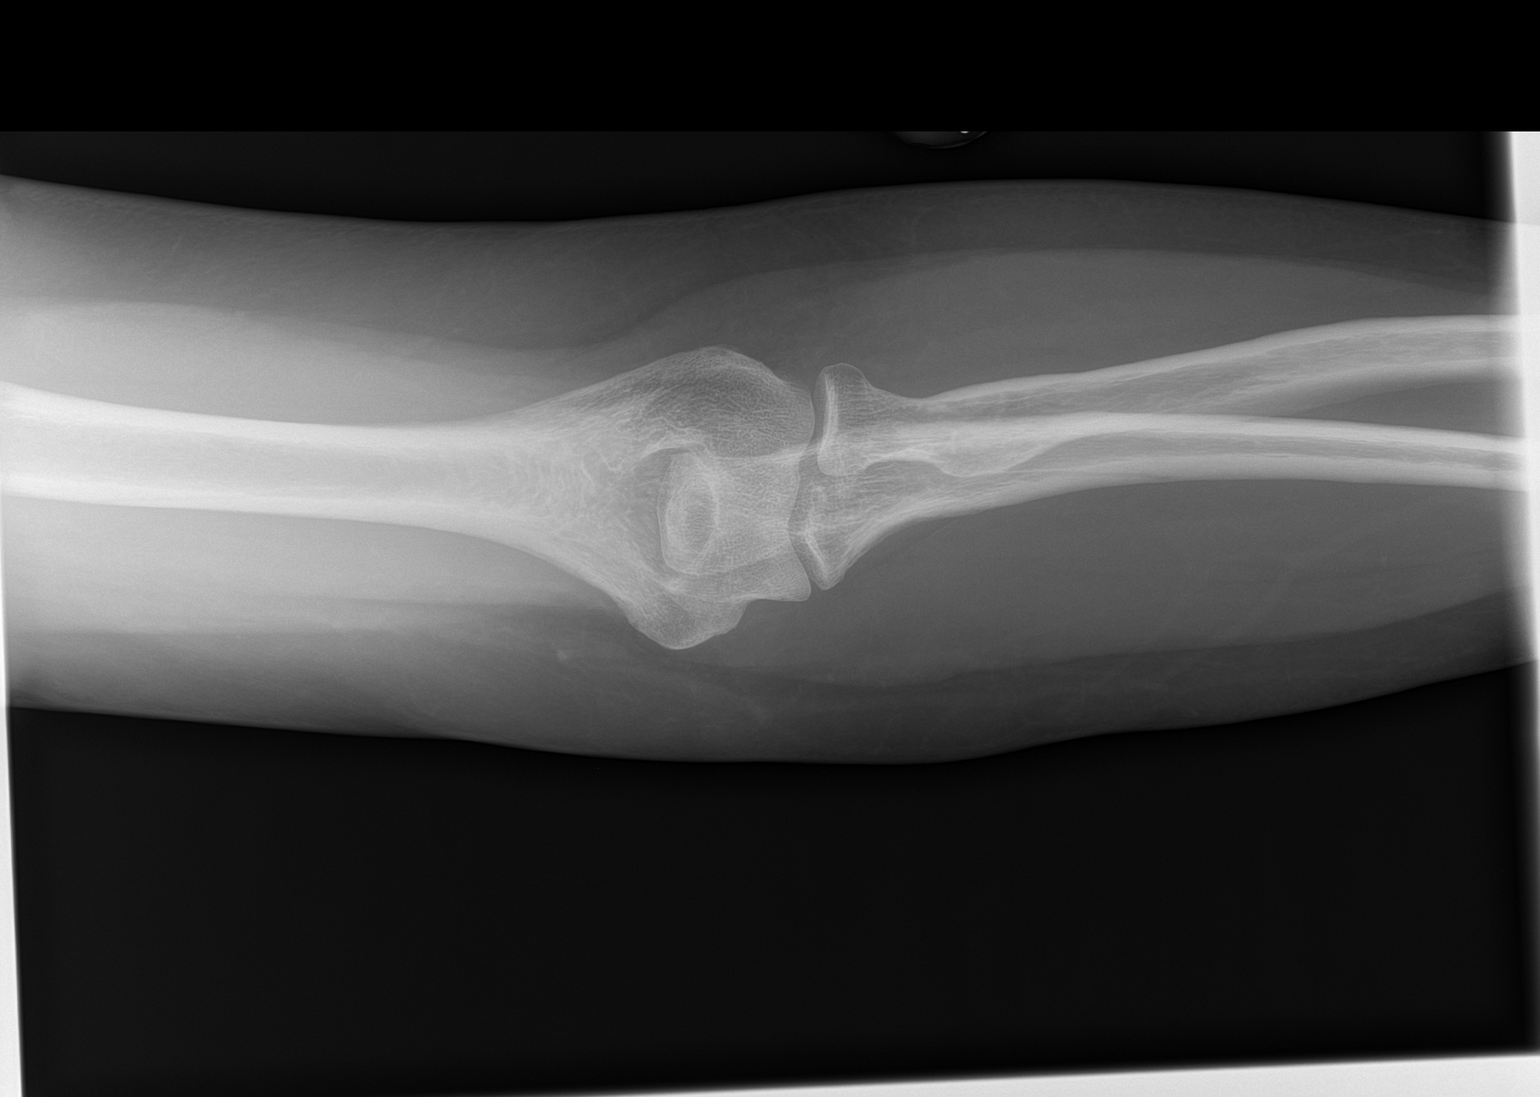

[elbow lat]
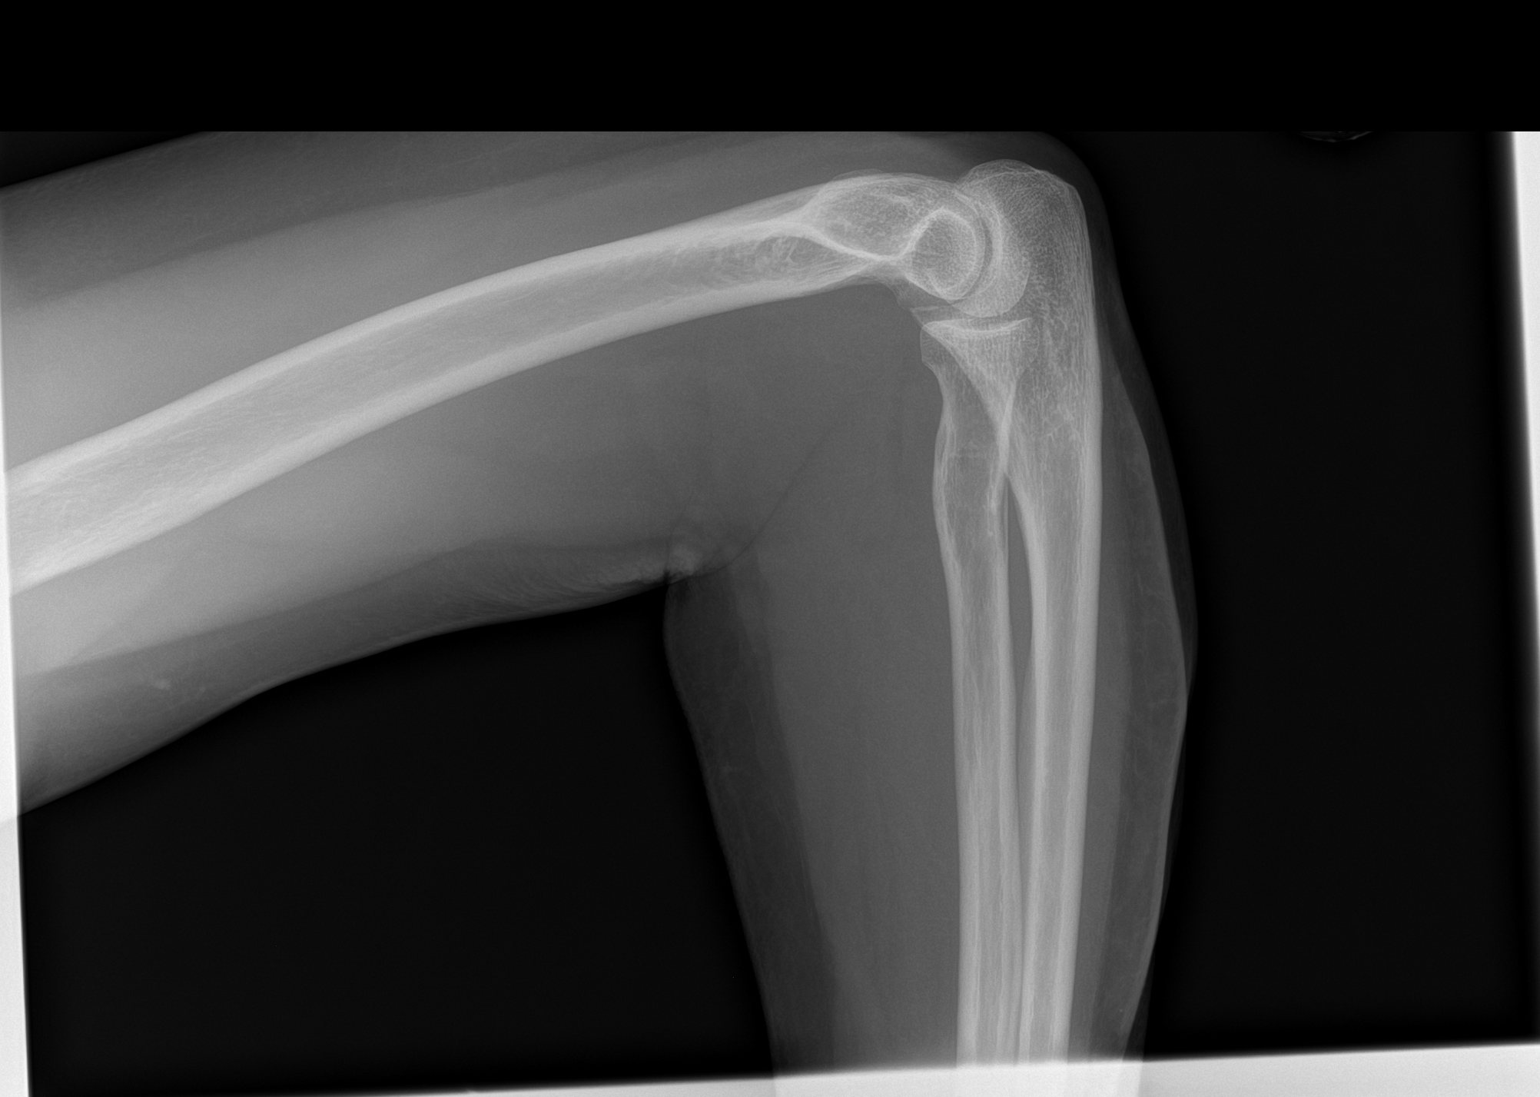

[2 of 2 positions shown; findings below may reference images not displayed]

FINDINGS: Frontal and lateral views of the left elbow demonstrate no
fractures. Alignment is anatomic. Joint spaces are well preserved.
No joint effusion.
IMPRESSION: 1. Unremarkable left elbow.

## 2022-08-28 ENCOUNTER — Ambulatory Visit: Payer: Medicare Other | Admitting: Family Medicine

## 2022-09-01 ENCOUNTER — Ambulatory Visit (INDEPENDENT_AMBULATORY_CARE_PROVIDER_SITE_OTHER): Payer: Medicare Other | Admitting: Family Medicine

## 2022-09-01 ENCOUNTER — Encounter: Payer: Self-pay | Admitting: Family Medicine

## 2022-09-01 VITALS — BP 130/80 | HR 72 | Temp 98.3°F | Resp 18 | Ht 64.0 in | Wt 181.2 lb

## 2022-09-01 DIAGNOSIS — J324 Chronic pansinusitis: Secondary | ICD-10-CM

## 2022-09-01 MED ORDER — CLARITHROMYCIN ER 500 MG PO TB24: 1000.0000 mg | ORAL_TABLET | Freq: Every day | ORAL | 0 refills | Status: DC

## 2022-09-01 MED ORDER — FLUTICASONE PROPIONATE 50 MCG/ACT NA SUSP: 2.0000 | Freq: Every day | NASAL | 6 refills | Status: AC

## 2022-09-01 NOTE — Progress Notes (Signed)
Subjective:   By signing my name below, I, [name], attest that this documentation has been prepared under the direction and in the presence of [name]. DO 09/01/2022   Patient ID: [name], female    DOB: 1955/09/15, 67 y.o.   MRN: [id number]  Chief Complaint  Patient presents with   Sinus Problem    Pt states she was seen at UC last month and was given antibiotics. Pt states still having right facial pain. Pt reports sinus pressure, congestion, sneezing.     HPI Patient is in today for a sick visit.  She complains of chronic intermittent sinus pain and pressure, temporal headaches, post nasal drip on her right side. She has associated pain and pressure on her right side of her face, jaw, and teeth. She went to Urgent Care about a month ago, after Christmas, and tested negative for Covid. Her symptoms resolved for about 3-4 days before returning.   Her current episode began on Friday and worsened on Saturday. She reports that the pain was so severe, she was unable to lift her head from her pillow. She takes Advil to resolve symptoms. Today, she has been feeling better. She has not been taking any antihistamines.  She denies having any fever, new muscle pain, new joint pain, new moles, sore throat, chest pain, palpitations, cough, SOB, wheezing, n/v/d, constipation, blood in stool, dysuria, frequency, or hematuria at this time.  Past Medical History:  Diagnosis Date   Allergic rhinitis    Allergy     Past Surgical History:  Procedure Laterality Date   BREAST BIOPSY  11-2000   benign   OOPHORECTOMY     VAGINAL HYSTERECTOMY      Family History  Problem Relation Age of Onset   Breast cancer Mother    Hypertension Mother    Stroke Mother    Pancreatitis Father     Social History   Socioeconomic History   Marital status: Married    Spouse name: Not on file   Number of children: 1   Years of education: Not on file   Highest education level: Not on  file  Occupational History   Occupation: investment banking  Tobacco Use   Smoking status: Never   Smokeless tobacco: Never  Vaping Use   Vaping Use: Never used  Substance and Sexual Activity   Alcohol use: Yes    Comment: rare   Drug use: No   Sexual activity: Yes    Partners: Male  Other Topics Concern   Not on file  Social History Narrative   Exercise- walking a little at work   Social Determinants of Radio broadcast assistant Strain: Not on file  Food Insecurity: Not on file  Transportation Needs: Not on file  Physical Activity: Not on file  Stress: Not on file  Social Connections: Not on file  Intimate Partner Violence: Not on file    Outpatient Medications Prior to Visit  Medication Sig Dispense Refill   Estriol POWD      cephALEXin (KEFLEX) 500 MG capsule Take 1 capsule (500 mg total) by mouth 4 (four) times daily. 28 capsule 0   gabapentin (NEURONTIN) 100 MG capsule Take 2 capsules (200 mg total) by mouth at bedtime. (Patient not taking: Reported on 09/01/2022) 180 capsule 0   No facility-administered medications prior to visit.    Allergies  Allergen Reactions   Sulfonamide Derivatives Swelling   Penicillins Rash    Review of Systems  Constitutional:  Negative for fever and malaise/fatigue.  HENT:  Positive for sinus pain (severe pain and pressure in temple, jaw, teeth on right side). Negative for congestion.        Post nasal drip   Eyes:  Negative for blurred vision.  Respiratory:  Negative for shortness of breath.   Cardiovascular:  Negative for chest pain, palpitations and leg swelling.  Gastrointestinal:  Negative for abdominal pain, blood in stool and nausea.  Genitourinary:  Negative for dysuria and frequency.  Musculoskeletal:  Negative for falls.  Skin:  Negative for rash.  Neurological:  Positive for headaches (Right temporal headache). Negative for dizziness and loss of consciousness.  Endo/Heme/Allergies:  Negative for environmental  allergies.  Psychiatric/Behavioral:  Negative for depression. The patient is not nervous/anxious.        Objective:    Physical Exam Vitals and nursing note reviewed.  Constitutional:      General: She is not in acute distress.    Appearance: Normal appearance.  HENT:     Head: Normocephalic and atraumatic.     Right Ear: Hearing, tympanic membrane and external ear normal.     Left Ear: Hearing, tympanic membrane and external ear normal.     Nose: Congestion present.     Right Sinus: Maxillary sinus tenderness and frontal sinus tenderness present.     Mouth/Throat:     Pharynx: No posterior oropharyngeal erythema.  Eyes:     Extraocular Movements: Extraocular movements intact.     Pupils: Pupils are equal, round, and reactive to light.  Cardiovascular:     Rate and Rhythm: Normal rate and regular rhythm.     Pulses: Normal pulses.     Heart sounds: Normal heart sounds. No murmur heard.    No gallop.  Pulmonary:     Effort: Pulmonary effort is normal. No respiratory distress.     Breath sounds: Normal breath sounds. No wheezing.  Skin:    General: Skin is warm and dry.  Neurological:     Mental Status: She is alert and oriented to person, place, and time.  Psychiatric:        Judgment: Judgment normal.     BP 130/80 (BP Location: Left Arm, Patient Position: Sitting, Cuff Size: Normal)   Pulse 72   Temp 98.3 F (36.8 C) (Oral)   Resp 18   Ht 5\' 4"  (1.626 m)   Wt 181 lb 3.2 oz (82.2 kg)   SpO2 98%   BMI 31.10 kg/m  Wt Readings from Last 3 Encounters:  09/01/22 181 lb 3.2 oz (82.2 kg)  08/29/21 174 lb 9.6 oz (79.2 kg)  08/27/21 172 lb (78 kg)    Diabetic Foot Exam - Simple   No data filed    Lab Results  Component Value Date   WBC 3.1 (L) 12/10/2021   HGB 13.4 12/10/2021   HCT 39.3 12/10/2021   PLT 205.0 12/10/2021   GLUCOSE 83 08/29/2021   CHOL 162 08/29/2021   TRIG 64.0 08/29/2021   HDL 63.90 08/29/2021   LDLCALC 85 08/29/2021   ALT 14 08/29/2021    AST 16 08/29/2021   NA 140 08/29/2021   K 4.6 08/29/2021   CL 106 08/29/2021   CREATININE 0.75 08/29/2021   BUN 19 08/29/2021   CO2 30 08/29/2021   TSH 1.57 08/29/2021    Lab Results  Component Value Date   TSH 1.57 08/29/2021   Lab Results  Component Value Date   WBC 3.1 (L) 12/10/2021  HGB 13.4 12/10/2021   HCT 39.3 12/10/2021   MCV 95.4 12/10/2021   PLT 205.0 12/10/2021   Lab Results  Component Value Date   NA 140 08/29/2021   K 4.6 08/29/2021   CO2 30 08/29/2021   GLUCOSE 83 08/29/2021   BUN 19 08/29/2021   CREATININE 0.75 08/29/2021   BILITOT 0.5 08/29/2021   ALKPHOS 52 08/29/2021   AST 16 08/29/2021   ALT 14 08/29/2021   PROT 6.3 08/29/2021   ALBUMIN 3.8 08/29/2021   CALCIUM 8.7 08/29/2021   ANIONGAP 5 11/29/2019   GFR 83.85 08/29/2021   Lab Results  Component Value Date   CHOL 162 08/29/2021   Lab Results  Component Value Date   HDL 63.90 08/29/2021   Lab Results  Component Value Date   LDLCALC 85 08/29/2021   Lab Results  Component Value Date   TRIG 64.0 08/29/2021   Lab Results  Component Value Date   CHOLHDL 3 08/29/2021   No results found for: "HGBA1C"     Assessment & Plan:   Problem List Items Addressed This Visit       Unprioritized   Pansinusitis - Primary   Relevant Medications   clarithromycin (BIAXIN XL) 500 MG 24 hr tablet   fluticasone (FLONASE) 50 MCG/ACT nasal spray   Meds ordered this encounter  Medications   clarithromycin (BIAXIN XL) 500 MG 24 hr tablet    Sig: Take 2 tablets (1,000 mg total) by mouth daily.    Dispense:  14 tablet    Refill:  0   fluticasone (FLONASE) 50 MCG/ACT nasal spray    Sig: Place 2 sprays into both nostrils daily.    Dispense:  16 g    Refill:  6   I, Pamela Held, DO, personally preformed the services described in this documentation.  All medical record entries made by the scribe were at my direction and in my presence.  I have reviewed the chart and discharge  instructions (if applicable) and agree that the record reflects my personal performance and is accurate and complete. 09/01/2022  I,Rachel Rivera,acting as a scribe for Pamela Held, DO.,have documented all relevant documentation on the behalf of Pamela Held, DO,as directed by  Pamela Held, DO while in the presence of [place], DO, have reviewed all documentation for this visit. The documentation on 09/01/22 for the exam, diagnosis, procedures, and orders are all accurate and complete.   Pamela Held, DO

## 2022-09-01 NOTE — Patient Instructions (Signed)

## 2022-09-01 NOTE — Assessment & Plan Note (Signed)
Biaxin xl x 7 days and flonase Also use otc antihistmine  Return to office as needed

## 2022-09-02 ENCOUNTER — Encounter: Payer: BC Managed Care – PPO | Admitting: Family Medicine

## 2022-09-23 NOTE — Progress Notes (Unsigned)
[name] [name] [name] [place] [name] Phone: [phone] Subjective:   [name], am serving as a scribe for Dr. Hulan Saas.  I'm seeing this patient by the request  of:  Ann Held, DO  CC: Right leg pain  [id number]  [name] is a 67 y.o. female coming in with complaint of R leg pain. Last seen for carpal tunnel in January 2023. Used her hip to push her washing machine back in place a few weeks ago. Was sore and then a few days later she was walking with a limp. Pain at night over lateral aspect of upper leg. Also notes a shooting pain from glute into hamstring on R side. This occurs when she is lying on her back and crosses the R ankle over the L. Also has days when she has [name] pain at all. Using IBU QHS. [name] history of back pain.       Past Medical History:  Diagnosis Date   Allergic rhinitis    Allergy    Past Surgical History:  Procedure Laterality Date   BREAST BIOPSY  11-2000   benign   OOPHORECTOMY     VAGINAL HYSTERECTOMY     Social History   Socioeconomic History   Marital status: Married    Spouse name: Not on file   Number of children: 1   Years of education: Not on file   Highest education level: Not on file  Occupational History   Occupation: investment banking  Tobacco Use   Smoking status: Never   Smokeless tobacco: Never  Vaping Use   Vaping Use: Never used  Substance and Sexual Activity   Alcohol use: Yes    Comment: rare   Drug use: [name]   Sexual activity: Yes    Partners: Male  Other Topics Concern   Not on file  Social History Narrative   Exercise- walking a little at work   Social Determinants of Radio broadcast assistant Strain: Not on file  Food Insecurity: Not on file  Transportation Needs: Not on file  Physical Activity: Not on file  Stress: Not on file  Social Connections: Not on file   Allergies  Allergen Reactions   Sulfonamide Derivatives Swelling    Penicillins Rash   Family History  Problem Relation Age of Onset   Breast cancer Mother    Hypertension Mother    Stroke Mother    Pancreatitis Father       Current Outpatient Medications (Respiratory):    fluticasone (FLONASE) 50 MCG/ACT nasal spray, Place 2 sprays into both nostrils daily.    Current Outpatient Medications (Other):    clarithromycin (BIAXIN XL) 500 MG 24 hr tablet, Take 2 tablets (1,000 mg total) by mouth daily.   Estriol POWD,    Reviewed prior external information including notes and imaging from  primary care provider As well as notes that were available from care everywhere and other healthcare systems.  Past medical history, social, surgical and family history all reviewed in electronic medical record.  [name] pertanent information unless stated regarding to the chief complaint.   Review of Systems:  [name] headache, visual changes, nausea, vomiting, diarrhea, constipation, dizziness, abdominal pain, skin rash, fevers, chills, night sweats, weight loss, swollen lymph nodes, body aches, joint swelling, chest pain, shortness of breath, mood changes. POSITIVE muscle aches  Objective  Blood pressure 118/84, pulse 83, height 5' 4"$  (1.626 m), weight 176 lb (79.8 kg), SpO2  99 %.   General: [name] apparent distress alert and oriented x3 mood and affect normal, dressed appropriately.  HEENT: Pupils equal, extraocular movements intact  Respiratory: Patient's speak in full sentences and does not appear short of breath  Cardiovascular: [name] lower extremity edema, non tender, [name] erythema  Right leg exam shows the patient is tender to palpation over the greater trochanteric area.  Patient does have a positive Corky Sox.  [name] pain in the groin area itself.  Some tenderness to palpation in the parascapular area in the paraspinal musculature of the lumbar spine.  Mild sacroiliac pain noted   Procedure: Real-time Ultrasound Guided Injection of right greater trochanteric bursitis secondary  to patient's body habitus Device: GE Logiq Q7 Ultrasound guided injection is preferred based studies that show increased duration, increased effect, greater accuracy, decreased procedural pain, increased response rate, and decreased cost with ultrasound guided versus blind injection.  Verbal informed consent obtained.  Time-out conducted.  Noted [name] overlying erythema, induration, or other signs of local infection.  Skin prepped in a sterile fashion.  Local anesthesia: Topical Ethyl chloride.  With sterile technique and under real time ultrasound guidance:  Greater trochanteric area was visualized and patient's bursa was noted. A 22-gauge 3 inch needle was inserted and 4 cc of 0.5% Marcaine and 1 cc of Kenalog 40 mg/dL was injected. Pictures taken Completed without difficulty  Pain immediately resolved suggesting accurate placement of the medication.  Advised to call if fevers/chills, erythema, induration, drainage, or persistent bleeding.  Impression: Technically successful ultrasound guided injection.   [id number]; 15 additional minutes spent for Therapeutic exercises as stated in above notes.  This included exercises focusing on stretching, strengthening, with significant focus on eccentric aspects.   Long term goals include an improvement in range of motion, strength, endurance as well as avoiding reinjury. Patient's frequency would include in 1-2 times a day, 3-5 times a week for a duration of 6-12 weeks. Hip strengthening exercises which included:  Pelvic tilt/bracing to help with proper recruitment of the lower abs and pelvic floor muscles  Glute strengthening to properly contract glutes without over-engaging low back and hamstrings - prone hip extension and glute bridge exercises Proper stretching techniques to increase effectiveness for the hip flexors, groin, quads, piriformic and low back when appropriate   Proper technique shown and discussed handout in great detail with ATC.  All questions  were discussed and answered.     Impression and Recommendations:    The above documentation has been reviewed and is accurate and complete Pamela Pulley, DO

## 2022-09-24 ENCOUNTER — Ambulatory Visit: Payer: Self-pay

## 2022-09-24 ENCOUNTER — Ambulatory Visit (INDEPENDENT_AMBULATORY_CARE_PROVIDER_SITE_OTHER): Payer: Medicare Other

## 2022-09-24 ENCOUNTER — Encounter: Payer: Self-pay | Admitting: Family Medicine

## 2022-09-24 ENCOUNTER — Ambulatory Visit: Payer: Medicare Other | Admitting: Family Medicine

## 2022-09-24 VITALS — BP 118/84 | HR 83 | Ht 64.0 in | Wt 176.0 lb

## 2022-09-24 DIAGNOSIS — M545 Low back pain, unspecified: Secondary | ICD-10-CM

## 2022-09-24 DIAGNOSIS — M25551 Pain in right hip: Secondary | ICD-10-CM | POA: Diagnosis not present

## 2022-09-24 DIAGNOSIS — M7061 Trochanteric bursitis, right hip: Secondary | ICD-10-CM

## 2022-09-24 DIAGNOSIS — M79604 Pain in right leg: Secondary | ICD-10-CM | POA: Diagnosis not present

## 2022-09-24 DIAGNOSIS — M47816 Spondylosis without myelopathy or radiculopathy, lumbar region: Secondary | ICD-10-CM | POA: Diagnosis not present

## 2022-09-24 NOTE — Patient Instructions (Signed)
Xray today Exercises Ice to outside of hip See me in 2 months

## 2022-09-24 NOTE — Assessment & Plan Note (Signed)
Patient given injection and tolerated the procedure well, discussed icing regimen and home exercises, discussed which activities to do and which ones to avoid, discussed that the differential includes lumbar radiculopathy and x-rays ordered today for further evaluation to see if there is any possibility of a nerve impingement that could be contributing.  Follow-up with me again in 3 months otherwise.  Worsening pain consider formal physical therapy and if any weakness with limited improvement possibility of advanced imaging.

## 2022-09-29 ENCOUNTER — Encounter: Payer: Self-pay | Admitting: Family Medicine

## 2022-09-29 ENCOUNTER — Ambulatory Visit (INDEPENDENT_AMBULATORY_CARE_PROVIDER_SITE_OTHER): Payer: Medicare Other | Admitting: Family Medicine

## 2022-09-29 ENCOUNTER — Ambulatory Visit: Payer: Medicare Other

## 2022-09-29 VITALS — BP 116/78 | HR 70 | Temp 98.7°F | Resp 18 | Ht 64.0 in | Wt 173.6 lb

## 2022-09-29 DIAGNOSIS — Z0000 Encounter for general adult medical examination without abnormal findings: Secondary | ICD-10-CM

## 2022-09-29 DIAGNOSIS — Z23 Encounter for immunization: Secondary | ICD-10-CM | POA: Diagnosis not present

## 2022-09-29 DIAGNOSIS — D729 Disorder of white blood cells, unspecified: Secondary | ICD-10-CM

## 2022-09-29 NOTE — Patient Instructions (Signed)
Preventive Care 65 Years and Older, Female Preventive care refers to lifestyle choices and visits with your health care provider that can promote health and wellness. Preventive care visits are also called wellness exams. What can I expect for my preventive care visit? Counseling Your health care provider may ask you questions about your: Medical history, including: Past medical problems. Family medical history. Pregnancy and menstrual history. History of falls. Current health, including: Memory and ability to understand (cognition). Emotional well-being. Home life and relationship well-being. Sexual activity and sexual health. Lifestyle, including: Alcohol, nicotine or tobacco, and drug use. Access to firearms. Diet, exercise, and sleep habits. Work and work environment. Sunscreen use. Safety issues such as seatbelt and bike helmet use. Physical exam Your health care provider will check your: Height and weight. These may be used to calculate your BMI (body mass index). BMI is a measurement that tells if you are at a healthy weight. Waist circumference. This measures the distance around your waistline. This measurement also tells if you are at a healthy weight and may help predict your risk of certain diseases, such as type 2 diabetes and high blood pressure. Heart rate and blood pressure. Body temperature. Skin for abnormal spots. What immunizations do I need?  Vaccines are usually given at various ages, according to a schedule. Your health care provider will recommend vaccines for you based on your age, medical history, and lifestyle or other factors, such as travel or where you work. What tests do I need? Screening Your health care provider may recommend screening tests for certain conditions. This may include: Lipid and cholesterol levels. Hepatitis C test. Hepatitis B test. HIV (human immunodeficiency virus) test. STI (sexually transmitted infection) testing, if you are at  risk. Lung cancer screening. Colorectal cancer screening. Diabetes screening. This is done by checking your blood sugar (glucose) after you have not eaten for a while (fasting). Mammogram. Talk with your health care provider about how often you should have regular mammograms. BRCA-related cancer screening. This may be done if you have a family history of breast, ovarian, tubal, or peritoneal cancers. Bone density scan. This is done to screen for osteoporosis. Talk with your health care provider about your test results, treatment options, and if necessary, the need for more tests. Follow these instructions at home: Eating and drinking  Eat a diet that includes fresh fruits and vegetables, whole grains, lean protein, and low-fat dairy products. Limit your intake of foods with high amounts of sugar, saturated fats, and salt. Take vitamin and mineral supplements as recommended by your health care provider. Do not drink alcohol if your health care provider tells you not to drink. If you drink alcohol: Limit how much you have to 0-1 drink a day. Know how much alcohol is in your drink. In the [place] one drink equals one 12 oz bottle of beer (355 mL), one 5 oz glass of wine (148 mL), or one 1 oz glass of hard liquor (44 mL). Lifestyle Brush your teeth every morning and night with fluoride toothpaste. Floss one time each day. Exercise for at least 30 minutes 5 or more days each week. Do not use any products that contain nicotine or tobacco. These products include cigarettes, chewing tobacco, and vaping devices, such as e-cigarettes. If you need help quitting, ask your health care provider. Do not use drugs. If you are sexually active, practice safe sex. Use a condom or other form of protection in order to prevent STIs. Take aspirin only as told by   your health care provider. Make sure that you understand how much to take and what form to take. Work with your health care provider to find out whether it  is safe and beneficial for you to take aspirin daily. Ask your health care provider if you need to take a cholesterol-lowering medicine (statin). Find healthy ways to manage stress, such as: Meditation, yoga, or listening to music. Journaling. Talking to a trusted person. Spending time with friends and family. Minimize exposure to UV radiation to reduce your risk of skin cancer. Safety Always wear your seat belt while driving or riding in a vehicle. Do not drive: If you have been drinking alcohol. Do not ride with someone who has been drinking. When you are tired or distracted. While texting. If you have been using any mind-altering substances or drugs. Wear a helmet and other protective equipment during sports activities. If you have firearms in your house, make sure you follow all gun safety procedures. What's next? Visit your health care provider once a year for an annual wellness visit. Ask your health care provider how often you should have your eyes and teeth checked. Stay up to date on all vaccines. This information is not intended to replace advice given to you by your health care provider. Make sure you discuss any questions you have with your health care provider. Document Revised: 01/23/2021 Document Reviewed: 01/23/2021 Elsevier Patient Education  2023 Elsevier Inc.  

## 2022-09-29 NOTE — Assessment & Plan Note (Signed)
Repeat today

## 2022-09-29 NOTE — Progress Notes (Addendum)
Subjective:   By signing my name below, I, [name], attest that this documentation has been prepared under the direction and in the presence of Ann Held, DO 09/29/22   Patient ID: [name], female    DOB: 08-Sep-1955, 67 y.o.   MRN: [id number]  Chief Complaint  Patient presents with   Annual Exam    Pt states fasting     HPI Patient is in today for a comprehensive physical exam. She is overall feeling well.   She has been walking at home about 30 minutes for 3 days a week. She reports that she used to stay more active and drink more water. Since the Covid lock down she has found it difficult to get back into her routine.   She reports she received an injection in her right hip after an injury while trying to move her washing machine. She states the injection seemed to help and she is feeling better.   Last mammogram: 03/2021. Results were normal.   Last colonoscopy: 11/2017. Results were normal. Next scheduled in  May or June.  Last pap: 01/2021.   She is  UTD on routine vision exam and dental exam. Her vision is corrected to 20/20 with contacts.   No new changes to family history. Her mother was 9 when she had a stroke.   She denies having any fever, new muscle pain, new moles, congestion, sinus pain, sore throat, chest pain, palpitations, cough, SOB, wheezing, n/v/d, constipation, blood in stool, dysuria, frequency, hematuria, or headaches at this time.  Past Medical History:  Diagnosis Date   Allergic rhinitis    Allergy     Past Surgical History:  Procedure Laterality Date   BREAST BIOPSY  11-2000   benign   OOPHORECTOMY     VAGINAL HYSTERECTOMY      Family History  Problem Relation Age of Onset   Breast cancer Mother    Hypertension Mother    Stroke Mother    Pancreatitis Father     Social History   Socioeconomic History   Marital status: Married    Spouse name: Not on file   Number of children: 1   Years of education: Not on file    Highest education level: Not on file  Occupational History   Occupation: investment banking  Tobacco Use   Smoking status: Never   Smokeless tobacco: Never  Vaping Use   Vaping Use: Never used  Substance and Sexual Activity   Alcohol use: Yes    Comment: rare   Drug use: No   Sexual activity: Yes    Partners: Male  Other Topics Concern   Not on file  Social History Narrative   Exercise- walking a little at work   Social Determinants of Health   Financial Resource Strain: [place]  (09/29/2022)   Overall Financial Resource Strain (CARDIA)    Difficulty of Paying Living Expenses: Not hard at all  Food Insecurity: Not on file  Transportation Needs: No Transportation Needs (09/29/2022)   PRAPARE - Hydrologist (Medical): No    Lack of Transportation (Non-Medical): No  Physical Activity: Insufficiently Active (09/29/2022)   Exercise Vital Sign    Days of Exercise per Week: 3 days    Minutes of Exercise per Session: 30 min  Stress: No Stress Concern Present (09/29/2022)   [place]    Feeling of Stress : Not at all  Social Connections: Socially Integrated (09/29/2022)   Social Connection and Isolation Panel [NHANES]    Frequency of Communication with Friends and Family: More than three times a week    Frequency of Social Gatherings with Friends and Family: Once a week    Attends Religious Services: More than 4 times per year    Active Member of [employer] or Organizations: Yes    Attends Music therapist: More than 4 times per year    Marital Status: Married  Human resources officer Violence: Not At Risk (09/29/2022)   Humiliation, Afraid, Rape, and Kick questionnaire    Fear of Current or Ex-Partner: No    Emotionally Abused: No    Physically Abused: No    Sexually Abused: No    Outpatient Medications Prior to Visit  Medication Sig Dispense Refill   Estriol POWD       fluticasone (FLONASE) 50 MCG/ACT nasal spray Place 2 sprays into both nostrils daily. 16 g 6   clarithromycin (BIAXIN XL) 500 MG 24 hr tablet Take 2 tablets (1,000 mg total) by mouth daily. (Patient not taking: Reported on 09/29/2022) 14 tablet 0   No facility-administered medications prior to visit.    Allergies  Allergen Reactions   Sulfonamide Derivatives Swelling   Penicillins Rash    Review of Systems  Constitutional:  Negative for fever and malaise/fatigue.  HENT:  Negative for congestion.   Eyes:  Negative for blurred vision.  Respiratory:  Negative for shortness of breath.   Cardiovascular:  Negative for chest pain, palpitations and leg swelling.  Gastrointestinal:  Negative for abdominal pain, blood in stool and nausea.  Genitourinary:  Negative for dysuria and frequency.  Musculoskeletal:  Positive for joint pain (injured hip-- received injection). Negative for falls.  Skin:  Negative for rash.  Neurological:  Negative for dizziness, loss of consciousness and headaches.  Endo/Heme/Allergies:  Negative for environmental allergies.  Psychiatric/Behavioral:  Negative for depression. The patient is not nervous/anxious.        Objective:    Physical Exam Vitals and nursing note reviewed.  Constitutional:      General: She is not in acute distress.    Appearance: She is well-developed.  HENT:     Head: Normocephalic and atraumatic.     Right Ear: External ear normal.     Left Ear: External ear normal.     Nose: Nose normal.  Eyes:     Conjunctiva/sclera: Conjunctivae normal.     Pupils: Pupils are equal, round, and reactive to light.  Neck:     Thyroid: No thyromegaly.     Vascular: No carotid bruit or JVD.  Cardiovascular:     Rate and Rhythm: Normal rate and regular rhythm.     Heart sounds: Normal heart sounds. No murmur heard. Pulmonary:     Effort: Pulmonary effort is normal. No respiratory distress.     Breath sounds: Normal breath sounds. No wheezing or  rales.  Chest:     Chest wall: No tenderness.  Abdominal:     General: Abdomen is flat. There is no distension.     Palpations: Abdomen is soft.     Tenderness: There is no abdominal tenderness. There is no right CVA tenderness, left CVA tenderness, guarding or rebound.  Musculoskeletal:        General: Normal range of motion.     Cervical back: Normal range of motion and neck supple.  Skin:    Findings: No lesion or rash.  Neurological:  General: No focal deficit present.     Mental Status: She is alert and oriented to person, place, and time.  Psychiatric:        Mood and Affect: Mood normal.        Behavior: Behavior normal.        Thought Content: Thought content normal.        Judgment: Judgment normal.   EKG--- sinus brady,  no acute changes   BP 116/78 (BP Location: Left Arm, Patient Position: Sitting, Cuff Size: Normal)   Pulse 70   Temp 98.7 F (37.1 C) (Oral)   Resp 18   Ht 5' 4"$  (1.626 m)   Wt 173 lb 9.6 oz (78.7 kg)   SpO2 95%   BMI 29.80 kg/m  Wt Readings from Last 3 Encounters:  09/29/22 173 lb 9.6 oz (78.7 kg)  09/24/22 176 lb (79.8 kg)  09/01/22 181 lb 3.2 oz (82.2 kg)       Assessment & Plan:  Abnormal WBC count Assessment & Plan: Repeat today  Orders: -     CBC with Differential/Platelet -     Comprehensive metabolic panel  Need for pneumococcal 20-valent conjugate vaccination -     Pneumococcal conjugate vaccine 20-valent  Encounter for initial annual wellness visit in Medicare patient -     EKG 12-Lead     I,Rachel Rivera,acting as a scribe for Ann Held, DO.,have documented all relevant documentation on the behalf of Ann Held, DO,as directed by  Ann Held, DO while in the presence of Ann Held, DO.   I, Ann Held, DO, personally preformed the services described in this documentation.  All medical record entries made by the scribe were at my direction and in my presence.  I have  reviewed the chart and discharge instructions (if applicable) and agree that the record reflects my personal performance and is accurate and complete. 09/29/22   Ann Held, DO

## 2022-09-30 LAB — COMPREHENSIVE METABOLIC PANEL
ALT: 22 U/L (ref 0–35)
AST: 20 U/L (ref 0–37)
Albumin: 4.3 g/dL (ref 3.5–5.2)
Alkaline Phosphatase: 59 U/L (ref 39–117)
BUN: 15 mg/dL (ref 6–23)
CO2: 28 mEq/L (ref 19–32)
Calcium: 9.2 mg/dL (ref 8.4–10.5)
Chloride: 104 mEq/L (ref 96–112)
Creatinine, Ser: 0.75 mg/dL (ref 0.40–1.20)
GFR: 83.21 mL/min (ref >60.00)
Glucose, Bld: 92 mg/dL (ref 70–99)
Potassium: 3.9 mEq/L (ref 3.5–5.1)
Sodium: 139 mEq/L (ref 135–145)
Total Bilirubin: 0.6 mg/dL (ref 0.2–1.2)
Total Protein: 7.3 g/dL (ref 6.0–8.3)

## 2022-09-30 LAB — CBC WITH DIFFERENTIAL/PLATELET
Basophils Absolute: 0 10*3/uL (ref 0.0–0.1)
Basophils Relative: 0.8 % (ref 0.0–3.0)
Eosinophils Absolute: 0.1 10*3/uL (ref 0.0–0.7)
Eosinophils Relative: 1.1 % (ref 0.0–5.0)
HCT: 43.8 % (ref 36.0–46.0)
Hemoglobin: 14.9 g/dL (ref 12.0–15.0)
Lymphocytes Relative: 27.4 % (ref 12.0–46.0)
Lymphs Abs: 1.5 10*3/uL (ref 0.7–4.0)
MCHC: 34 g/dL (ref 30.0–36.0)
MCV: 96.1 fl (ref 78.0–100.0)
Monocytes Absolute: 0.4 10*3/uL (ref 0.1–1.0)
Monocytes Relative: 8.4 % (ref 3.0–12.0)
Neutro Abs: 3.3 10*3/uL (ref 1.4–7.7)
Neutrophils Relative %: 62.3 % (ref 43.0–77.0)
Platelets: 242 10*3/uL (ref 150.0–400.0)
RBC: 4.56 Mil/uL (ref 3.87–5.11)
RDW: 13.5 % (ref 11.5–15.5)
WBC: 5.3 10*3/uL (ref 4.0–10.5)

## 2022-10-14 DIAGNOSIS — H5213 Myopia, bilateral: Secondary | ICD-10-CM | POA: Diagnosis not present

## 2022-11-26 NOTE — Progress Notes (Unsigned)
[name] [address] [place] [address] Phone: [phone] Subjective:   [name], am serving as a scribe for Dr. Antoine Primas.  I'm seeing this patient by the request  of:  Donato Schultz, DO  CC: Right leg pain  [id number]  09/24/2022 Patient given injection and tolerated the procedure well, discussed icing regimen and home exercises, discussed which activities to do and which ones to avoid, discussed that the differential includes lumbar radiculopathy and x-rays ordered today for further evaluation to see if there is any possibility of a nerve impingement that could be contributing.  Follow-up with me again in 3 months otherwise.  Worsening pain consider formal physical therapy and if any weakness with limited improvement possibility of advanced imaging.      Update 11/27/2022 [name] is a 67 y.o. female coming in with complaint of R hip and lower back pain. Patient states that her pain improved after the injection for about one month. Pain increased with having to walk up and down the stairs. Pain over R GT that radiates into the back.       Past Medical History:  Diagnosis Date   Allergic rhinitis    Allergy    Past Surgical History:  Procedure Laterality Date   BREAST BIOPSY  11-2000   benign   OOPHORECTOMY     VAGINAL HYSTERECTOMY     Social History   Socioeconomic History   Marital status: Married    Spouse name: Not on file   Number of children: 1   Years of education: Not on file   Highest education level: Not on file  Occupational History   Occupation: investment banking  Tobacco Use   Smoking status: Never   Smokeless tobacco: Never  Vaping Use   Vaping Use: Never used  Substance and Sexual Activity   Alcohol use: Yes    Comment: rare   Drug use: No   Sexual activity: Yes    Partners: Male  Other Topics Concern   Not on file  Social History Narrative   Exercise- walking a little at  work   Social Determinants of Health   Financial Resource Strain: Low Risk  (09/29/2022)   Overall Financial Resource Strain (CARDIA)    Difficulty of Paying Living Expenses: Not hard at all  Food Insecurity: Not on file  Transportation Needs: No Transportation Needs (09/29/2022)   PRAPARE - Administrator, Civil Service (Medical): No    Lack of Transportation (Non-Medical): No  Physical Activity: Insufficiently Active (09/29/2022)   Exercise Vital Sign    Days of Exercise per Week: 3 days    Minutes of Exercise per Session: 30 min  Stress: No Stress Concern Present (09/29/2022)   [employer] of Occupational Health - Occupational Stress Questionnaire    Feeling of Stress : Not at all  Social Connections: Socially Integrated (09/29/2022)   Social Connection and Isolation Panel [NHANES]    Frequency of Communication with Friends and Family: More than three times a week    Frequency of Social Gatherings with Friends and Family: Once a week    Attends Religious Services: More than 4 times per year    Active Member of [employer] or Organizations: Yes    Attends Engineer, structural: More than 4 times per year    Marital Status: Married   Allergies  Allergen Reactions   Sulfonamide Derivatives Swelling   Penicillins Rash   Family  History  Problem Relation Age of Onset   Breast cancer Mother    Hypertension Mother    Stroke Mother    Pancreatitis Father     Current Outpatient Medications (Endocrine & Metabolic):    predniSONE (DELTASONE) 20 MG tablet, Take 2 tablets (40 mg total) by mouth daily with breakfast.   Current Outpatient Medications (Respiratory):    fluticasone (FLONASE) 50 MCG/ACT nasal spray, Place 2 sprays into both nostrils daily.    Current Outpatient Medications (Other):    Estriol POWD,    Reviewed prior external information including notes and imaging from  primary care provider As well as notes that were available from care  everywhere and other healthcare systems.  Past medical history, social, surgical and family history all reviewed in electronic medical record.  No pertanent information unless stated regarding to the chief complaint.   Review of Systems:  No headache, visual changes, nausea, vomiting, diarrhea, constipation, dizziness, abdominal pain, skin rash, fevers, chills, night sweats, weight loss, swollen lymph nodes, body aches, joint swelling, chest pain, shortness of breath, mood changes. POSITIVE muscle aches  Objective  Blood pressure 128/72, pulse 68, height  (1.626 m), weight 170 lb (77.1 kg), SpO2 99 %.   General: No apparent distress alert and oriented x3 mood and affect normal, dressed appropriately.  HEENT: Pupils equal, extraocular movements intact  Respiratory: Patient's speak in full sentences and does not appear short of breath  Cardiovascular: No lower extremity edema, non tender, no erythema  Right severe tenderness to palpation over the greater trochanteric area. Negative straight leg test noted.  Neurovascular intact distally.   After verbal consent patient was prepped with alcohol swab and with a 21-gauge 2 inch needle injected into the right greater trochanteric area with 2 cc of 0.5% Marcaine and 1 cc of Kenalog 40 mg/mL.  No blood loss.  Band-Aid placed.  Postinjection instructions given   Impression and Recommendations:    The above documentation has been reviewed and is accurate and complete Judi Saa, DO

## 2022-11-27 ENCOUNTER — Ambulatory Visit: Payer: Medicare Other | Admitting: Family Medicine

## 2022-11-27 ENCOUNTER — Encounter: Payer: Self-pay | Admitting: Family Medicine

## 2022-11-27 VITALS — BP 128/72 | HR 68 | Ht 64.0 in | Wt 170.0 lb

## 2022-11-27 DIAGNOSIS — M7061 Trochanteric bursitis, right hip: Secondary | ICD-10-CM | POA: Diagnosis not present

## 2022-11-27 MED ORDER — PREDNISONE 20 MG PO TABS: 40.0000 mg | ORAL_TABLET | Freq: Every day | ORAL | 0 refills | Status: DC

## 2022-11-27 NOTE — Assessment & Plan Note (Signed)
Repeat injection given today and tolerated the procedure well, discussed with patient about which activities to do and which ones to avoid.  Patient is going to the one of the primary caregivers for her daughter who likely has cancer.  Discussed which activities to do and which ones to avoid.  Would like to see patient again in 3 months to make sure patient is improving otherwise further workup including advanced imaging of potentially lumbar and pelvis will be necessary.

## 2022-11-27 NOTE — Patient Instructions (Signed)
Great to see you as always  Know we are here if you need [place] Try to do the exercises If no better or worsening pain take prednisone daily for 5 days See me again in 3 months

## 2022-12-17 DIAGNOSIS — K648 Other hemorrhoids: Secondary | ICD-10-CM | POA: Diagnosis not present

## 2022-12-17 DIAGNOSIS — Z1211 Encounter for screening for malignant neoplasm of colon: Secondary | ICD-10-CM | POA: Diagnosis not present

## 2022-12-17 DIAGNOSIS — Z09 Encounter for follow-up examination after completed treatment for conditions other than malignant neoplasm: Secondary | ICD-10-CM | POA: Diagnosis not present

## 2022-12-17 DIAGNOSIS — Z86010 Personal history of colonic polyps: Secondary | ICD-10-CM | POA: Diagnosis not present

## 2023-01-06 ENCOUNTER — Encounter: Payer: Self-pay | Admitting: Family Medicine

## 2023-01-06 ENCOUNTER — Ambulatory Visit (INDEPENDENT_AMBULATORY_CARE_PROVIDER_SITE_OTHER): Payer: Medicare Other | Admitting: Family Medicine

## 2023-01-06 VITALS — BP 120/68 | HR 81 | Temp 97.9°F | Resp 12 | Ht 64.0 in | Wt 172.0 lb

## 2023-01-06 DIAGNOSIS — H6501 Acute serous otitis media, right ear: Secondary | ICD-10-CM | POA: Diagnosis not present

## 2023-01-06 MED ORDER — AZITHROMYCIN 250 MG PO TABS: ORAL_TABLET | ORAL | 0 refills | Status: DC

## 2023-01-06 NOTE — Progress Notes (Addendum)
Subjective:   By signing my name below, I, Shehryar Baig, attest that this documentation has been prepared under the direction and in the presence of Donato Schultz, DO. 01/06/2023   Patient ID: [name], female    DOB: 05-26-1956, 67 y.o.   MRN: [id number]  Chief Complaint  Patient presents with   Otalgia    Right ear    Sinusitis    Otalgia  Pertinent negatives include no abdominal pain, coughing, diarrhea, headaches, hearing loss, rash, sore throat or vomiting.  Sinusitis Associated symptoms include ear pain (left ear pain). Pertinent negatives include no chills, congestion, coughing, headaches, shortness of breath or sore throat.   Patient is in today for a office visit.   She complains of constant right ear pain. She recently recovered from sore throat and sinuitis. She denies sore throat at this time. She continues using Flonase nasal spray to manage her symptoms. She is not taking any anti-histamines or decongestants at this time.     Past Medical History:  Diagnosis Date   Allergic rhinitis    Allergy     Past Surgical History:  Procedure Laterality Date   BREAST BIOPSY  11-2000   benign   OOPHORECTOMY     VAGINAL HYSTERECTOMY      Family History  Problem Relation Age of Onset   Breast cancer Mother    Hypertension Mother    Stroke Mother    Pancreatitis Father     Social History   Socioeconomic History   Marital status: Married    Spouse name: Not on file   Number of children: 1   Years of education: Not on file   Highest education level: Not on file  Occupational History   Occupation: investment banking  Tobacco Use   Smoking status: Never   Smokeless tobacco: Never  Vaping Use   Vaping Use: Never used  Substance and Sexual Activity   Alcohol use: Yes    Comment: rare   Drug use: No   Sexual activity: Yes    Partners: Male  Other Topics Concern   Not on file  Social History Narrative   Exercise- walking a little at work    Social Determinants of Health   Financial Resource Strain: Low Risk  (09/29/2022)   Overall Financial Resource Strain (CARDIA)    Difficulty of Paying Living Expenses: Not hard at all  Food Insecurity: Not on file  Transportation Needs: No Transportation Needs (09/29/2022)   PRAPARE - Administrator, Civil Service (Medical): No    Lack of Transportation (Non-Medical): No  Physical Activity: Insufficiently Active (09/29/2022)   Exercise Vital Sign    Days of Exercise per Week: 3 days    Minutes of Exercise per Session: 30 min  Stress: No Stress Concern Present (09/29/2022)   [employer] of Occupational Health - Occupational Stress Questionnaire    Feeling of Stress : Not at all  Social Connections: Socially Integrated (09/29/2022)   Social Connection and Isolation Panel [NHANES]    Frequency of Communication with Friends and Family: More than three times a week    Frequency of Social Gatherings with Friends and Family: Once a week    Attends Religious Services: More than 4 times per year    Active Member of [employer] or Organizations: Yes    Attends Banker Meetings: More than 4 times per year    Marital Status: Married  Catering manager Violence: Not At Risk (09/29/2022)  Humiliation, Afraid, Rape, and Kick questionnaire    Fear of Current or Ex-Partner: No    Emotionally Abused: No    Physically Abused: No    Sexually Abused: No    Outpatient Medications Prior to Visit  Medication Sig Dispense Refill   Estriol POWD      fluticasone (FLONASE) 50 MCG/ACT nasal spray Place 2 sprays into both nostrils daily. 16 g 6   predniSONE (DELTASONE) 20 MG tablet Take 2 tablets (40 mg total) by mouth daily with breakfast. 10 tablet 0   No facility-administered medications prior to visit.    Allergies  Allergen Reactions   Sulfonamide Derivatives Swelling   Penicillins Rash    Review of Systems  Constitutional:  Negative for chills, fever and  malaise/fatigue.  HENT:  Positive for ear pain (left ear pain). Negative for congestion, hearing loss and sore throat.   Eyes:  Negative for discharge.  Respiratory:  Negative for cough, sputum production and shortness of breath.   Cardiovascular:  Negative for chest pain, palpitations and leg swelling.  Gastrointestinal:  Negative for abdominal pain, blood in stool, constipation, diarrhea, heartburn, nausea and vomiting.  Genitourinary:  Negative for dysuria, frequency, hematuria and urgency.  Musculoskeletal:  Negative for back pain, falls and myalgias.  Skin:  Negative for rash.  Neurological:  Negative for dizziness, sensory change, loss of consciousness, weakness and headaches.  Endo/Heme/Allergies:  Negative for environmental allergies. Does not bruise/bleed easily.  Psychiatric/Behavioral:  Negative for depression and suicidal ideas. The patient is not nervous/anxious and does not have insomnia.        Objective:    Physical Exam Vitals and nursing note reviewed.  Constitutional:      General: She is not in acute distress.    Appearance: Normal appearance. She is not ill-appearing.  HENT:     Head: Normocephalic and atraumatic.     Right Ear: Ear canal and external ear normal. Tenderness present. A middle ear effusion is present. Tympanic membrane is retracted.     Left Ear: Tympanic membrane, ear canal and external ear normal.     Ears:     Comments: Dull right TM Eyes:     Extraocular Movements: Extraocular movements intact.     Pupils: Pupils are equal, round, and reactive to light.  Cardiovascular:     Rate and Rhythm: Normal rate and regular rhythm.     Heart sounds: Normal heart sounds. No murmur heard.    No gallop.  Pulmonary:     Effort: Pulmonary effort is normal. No respiratory distress.     Breath sounds: Normal breath sounds. No wheezing or rales.  Skin:    General: Skin is warm and dry.  Neurological:     General: No focal deficit present.     Mental  Status: She is alert and oriented to person, place, and time.  Psychiatric:        Mood and Affect: Mood normal.        Judgment: Judgment normal.     BP 120/68 (BP Location: Left Arm, Cuff Size: Large)   Pulse 81   Temp 97.9 F (36.6 C) (Oral)   Resp 12   Ht 5\' 4"  (1.626 m)   Wt 172 lb (78 kg)   SpO2 99%   BMI 29.52 kg/m  Wt Readings from Last 3 Encounters:  01/06/23 172 lb (78 kg)  11/27/22 170 lb (77.1 kg)  09/29/22 173 lb 9.6 oz (78.7 kg)       Assessment &  Plan:  Non-recurrent acute serous otitis media of right ear -     Azithromycin; As directed  Dispense: 6 each; Refill: 0  Con't flonase and add antihistamine Z pak  Return to office as needed   I, Donato Schultz, DO, personally preformed the services described in this documentation.  All medical record entries made by the scribe were at my direction and in my presence.  I have reviewed the chart and discharge instructions (if applicable) and agree that the record reflects my personal performance and is accurate and complete. 01/06/2023   I,Shehryar Baig,acting as a scribe for Donato Schultz, DO.,have documented all relevant documentation on the behalf of Donato Schultz, DO,as directed by  Donato Schultz, DO while in the presence of Donato Schultz, DO.   Donato Schultz, DO

## 2023-01-19 ENCOUNTER — Ambulatory Visit (INDEPENDENT_AMBULATORY_CARE_PROVIDER_SITE_OTHER): Payer: Medicare Other | Admitting: Family Medicine

## 2023-01-19 ENCOUNTER — Encounter: Payer: Self-pay | Admitting: Family Medicine

## 2023-01-19 VITALS — BP 116/52 | HR 84 | Temp 98.7°F | Ht 64.0 in | Wt 171.4 lb

## 2023-01-19 DIAGNOSIS — J0140 Acute pansinusitis, unspecified: Secondary | ICD-10-CM | POA: Diagnosis not present

## 2023-01-19 MED ORDER — PREDNISONE 20 MG PO TABS: 40.0000 mg | ORAL_TABLET | Freq: Every day | ORAL | 0 refills | Status: AC

## 2023-01-19 MED ORDER — DOXYCYCLINE HYCLATE 100 MG PO CAPS: 100.0000 mg | ORAL_CAPSULE | Freq: Two times a day (BID) | ORAL | 0 refills | Status: DC

## 2023-01-19 NOTE — Progress Notes (Signed)
Acute Office Visit  Subjective:     Patient ID: [name], female    DOB: [birth year], 67 y.o.   MRN: [id number]  Chief Complaint  Patient presents with   Ear Pain    right    HPI Patient is in today for sinus symptoms, right ear pain.  Discussed the use of AI scribe software for clinical note transcription with the patient, who gave verbal consent to proceed.  History of Present Illness   The patient, with a history of sinus infections and earaches, presents ten days after a previous visit with Dr. Laury Axon. She was prescribed a Z-Pak (azithromycin) for a sinus infection and earache. However, she reports that the sinus headache and earache have returned. The pain is primarily located in the maxillary sinuses and radiates throughout the entire head, worse on the right side. She denies fever and notes that it took nearly five days for the pain to subside after starting the Z-Pak. She has been adhering to a regimen of Flonase and Claritin as advised by Dr. Laury Axon. She has a history of allergies to penicillin and sulfa drugs but tolerates doxycycline. She denies chest pain, shortness of breath, and coughing, and reports no changes in hearing or sore throat. She does report pain when pressure is applied to the outside of the ear.          ROS All review of systems negative except what is listed in the HPI      Objective:    BP (!) 116/52   Pulse 84   Temp 98.7 F (37.1 C)   Ht 5\' 4"  (1.626 m)   Wt 171 lb 6.4 oz (77.7 kg)   SpO2 99%   BMI 29.42 kg/m    Physical Exam Vitals reviewed.  Constitutional:      Appearance: Normal appearance.  HENT:     Head: Normocephalic and atraumatic.     Comments: Right maxillary/frontal sinus TTP    Right Ear: A middle ear effusion is present.     Nose: Nose normal.     Mouth/Throat:     Mouth: Mucous membranes are moist.  Eyes:     Extraocular Movements: Extraocular movements intact.     Pupils: Pupils are equal, round, and  reactive to light.  Cardiovascular:     Rate and Rhythm: Normal rate and regular rhythm.     Pulses: Normal pulses.     Heart sounds: Normal heart sounds.  Pulmonary:     Effort: Pulmonary effort is normal.     Breath sounds: Normal breath sounds.  Musculoskeletal:     Cervical back: Normal range of motion and neck supple. No tenderness.  Lymphadenopathy:     Cervical: No cervical adenopathy.  Skin:    General: Skin is warm and dry.  Neurological:     Mental Status: She is alert and oriented to person, place, and time.  Psychiatric:        Mood and Affect: Mood normal.        Behavior: Behavior normal.        Thought Content: Thought content normal.        Judgment: Judgment normal.     No results found for any visits on 01/19/23.      Assessment & Plan:   Problem List Items Addressed This Visit     SINUSITIS-  - Primary Adding doxycycline and prednisone burst.  Continue Flonase and antihistamine.  Continue supportive measures including rest, hydration, humidifier use, steam  showers, warm compresses to sinuses, warm liquids with lemon and honey, and over-the-counter cough, cold, and analgesics as needed.   Please contact office for follow-up if symptoms do not improve or worsen. Seek emergency care if symptoms become severe.     Relevant Medications   doxycycline (VIBRAMYCIN) 100 MG capsule   predniSONE (DELTASONE) 20 MG tablet    Meds ordered this encounter  Medications   doxycycline (VIBRAMYCIN) 100 MG capsule    Sig: Take 1 capsule (100 mg total) by mouth 2 (two) times daily.    Dispense:  20 capsule    Refill:  0    Order Specific Question:   Supervising Provider    Answer:   Danise Edge A [[id number]]   predniSONE (DELTASONE) 20 MG tablet    Sig: Take 2 tablets (40 mg total) by mouth daily with breakfast for 5 days.    Dispense:  10 tablet    Refill:  0    Order Specific Question:   Supervising Provider    Answer:   Danise Edge A [[id number]]    Return if  symptoms worsen or fail to improve.  Clayborne Dana, NP

## 2023-01-19 NOTE — Patient Instructions (Addendum)
Adding doxycycline and prednisone burst.  Continue Flonase and antihistamine.  Continue supportive measures including rest, hydration, humidifier use, steam showers, warm compresses to sinuses, warm liquids with lemon and honey, and over-the-counter cough, cold, and analgesics as needed.   Please contact office for follow-up if symptoms do not improve or worsen. Seek emergency care if symptoms become severe.   Enjoy your trip!!

## 2023-01-21 ENCOUNTER — Encounter: Payer: Self-pay | Admitting: Gastroenterology

## 2023-01-23 ENCOUNTER — Telehealth: Payer: Self-pay

## 2023-01-23 ENCOUNTER — Telehealth: Payer: Self-pay | Admitting: Family Medicine

## 2023-01-23 DIAGNOSIS — H9201 Otalgia, right ear: Secondary | ICD-10-CM

## 2023-01-23 MED ORDER — CIPROFLOXACIN-DEXAMETHASONE 0.3-0.1 % OT SUSP: 4.0000 [drp] | Freq: Two times a day (BID) | OTIC | 0 refills | Status: AC

## 2023-01-23 NOTE — Telephone Encounter (Signed)
LVM for patient to call back to discuss.

## 2023-01-23 NOTE — Addendum Note (Signed)
Addended by: Hyman Hopes B on: 01/23/2023 11:35 AM   Modules accepted: Orders

## 2023-01-23 NOTE — Telephone Encounter (Signed)
Patient made aware of recommendations from [facility].

## 2023-01-23 NOTE — Telephone Encounter (Signed)
PA initiated via Covermymeds; KEY: [id number]. Awaiting determination.

## 2023-01-23 NOTE — Telephone Encounter (Signed)
On exam, it looked more like an effusion which we treat with the prednisone burst and Flonase plus a daily antihistamine. Effusions can take up to 12 weeks to fully resolve and if no improvement or worsening we would then refer to ENT. Since she is about to be traveling, I can try to send in some drops just in case there is an infectious component. If not improving, recommend reevaluation and/or ENT referral.

## 2023-01-23 NOTE — Telephone Encounter (Signed)
Pt said her ear is still bothering her and she flies out on Sunday and will be gone for 2 weeks. She wants to know if there is something else that can be prescribed.

## 2023-01-23 NOTE — Telephone Encounter (Signed)
Seen 01/19/2023:  SINUSITIS-  - Primary Adding doxycycline and prednisone burst.  Continue Flonase and antihistamine.  Continue supportive measures including rest, hydration, humidifier use, steam showers, warm compresses to sinuses, warm liquids with lemon and honey, and over-the-counter cough, cold, and analgesics as needed.    Please contact office for follow-up if symptoms do not improve or worsen. Seek emergency care if symptoms become severe.

## 2023-01-26 NOTE — Telephone Encounter (Signed)
LVM letting patient know medication denied. To call with questions or if not better.

## 2023-01-26 NOTE — Telephone Encounter (Signed)
PA denied.   Denied. We denied this request under Medicare Part D because Ciprofloxacin-Dexamethasone otic suspension is not being prescribed for an FDA labeled or medically accepted use. A medically accepted use is approved by the FDA or supported by the [facility] Formulary Service Drug Information and the DRUGDEX Information System. In this case, Ciprofloxacin-Dexamethasone otic suspension is not being prescribed in accordance with an FDA labeled use or use accepted by the Medicare approved drug compendia.

## 2023-02-19 ENCOUNTER — Encounter: Payer: Self-pay | Admitting: Family Medicine

## 2023-02-19 NOTE — Telephone Encounter (Signed)
Please advise on accepting them as new patients.

## 2023-02-25 NOTE — Progress Notes (Signed)
[facility] [address] [place] [address] Phone: [phone] Subjective:   [name], am serving as a scribe for Dr. Antoine Primas.  I'm seeing this patient by the request  of:  Donato Schultz, DO  CC:   [id number]  11/27/2022 Repeat injection given today and tolerated the procedure well, discussed with patient about which activities to do and which ones to avoid.  Patient is going to the one of the primary caregivers for her daughter who likely has cancer.  Discussed which activities to do and which ones to avoid.  Would like to see patient again in 3 months to make sure patient is improving otherwise further workup including advanced imaging of potentially lumbar and pelvis will be necessary.    Update 02/26/2023 [name] is a 67 y.o. female coming in with complaint of R hip pain. Patient states that the last injection did not give her any relief. Pain in glute and into R side of her back. Stepping up will increase the pain in lateral quad. Painful to lie on either side at night. Denies any tingling or numbness. Patient is no longer able exercise due to pain. Patient used prednisone and this was helpful but when course ended her pain returned. Was on another prescription of prednisone for sinus infection but that dose did not help her hip pain and she also noted swelling in the ankles while on medication.      Past Medical History:  Diagnosis Date   Allergic rhinitis    Allergy    Past Surgical History:  Procedure Laterality Date   BREAST BIOPSY  11-2000   benign   OOPHORECTOMY     VAGINAL HYSTERECTOMY     Social History   Socioeconomic History   Marital status: Married    Spouse name: Not on file   Number of children: 1   Years of education: Not on file   Highest education level: Not on file  Occupational History   Occupation: investment banking  Tobacco Use   Smoking status: Never   Smokeless tobacco: Never   Vaping Use   Vaping status: Never Used  Substance and Sexual Activity   Alcohol use: Yes    Comment: rare   Drug use: No   Sexual activity: Yes    Partners: Male  Other Topics Concern   Not on file  Social History Narrative   Exercise- walking a little at work   Social Determinants of Health   Financial Resource Strain: Low Risk  (09/29/2022)   Overall Financial Resource Strain (CARDIA)    Difficulty of Paying Living Expenses: Not hard at all  Food Insecurity: Not on file  Transportation Needs: No Transportation Needs (09/29/2022)   PRAPARE - Administrator, Civil Service (Medical): No    Lack of Transportation (Non-Medical): No  Physical Activity: Insufficiently Active (09/29/2022)   Exercise Vital Sign    Days of Exercise per Week: 3 days    Minutes of Exercise per Session: 30 min  Stress: No Stress Concern Present (09/29/2022)   [employer] of Occupational Health - Occupational Stress Questionnaire    Feeling of Stress : Not at all  Social Connections: Socially Integrated (09/29/2022)   Social Connection and Isolation Panel [NHANES]    Frequency of Communication with Friends and Family: More than three times a week    Frequency of Social Gatherings with Friends and Family: Once a week    Attends  Religious Services: More than 4 times per year    Active Member of Clubs or Organizations: Yes    Attends Banker Meetings: More than 4 times per year    Marital Status: Married   Allergies  Allergen Reactions   Sulfonamide Derivatives Swelling   Penicillins Rash   Family History  Problem Relation Age of Onset   Breast cancer Mother    Hypertension Mother    Stroke Mother    Pancreatitis Father       Current Outpatient Medications (Respiratory):    fluticasone (FLONASE) 50 MCG/ACT nasal spray, Place 2 sprays into both nostrils daily.    Current Outpatient Medications (Other):    azithromycin (ZITHROMAX Z-PAK) 250 MG tablet, As  directed   doxycycline (VIBRAMYCIN) 100 MG capsule, Take 1 capsule (100 mg total) by mouth 2 (two) times daily.   Estriol POWD,    Reviewed prior external information including notes and imaging from  primary care provider As well as notes that were available from care everywhere and other healthcare systems.  Past medical history, social, surgical and family history all reviewed in electronic medical record.  No pertanent information unless stated regarding to the chief complaint.   Review of Systems:  No headache, visual changes, nausea, vomiting, diarrhea, constipation, dizziness, abdominal pain, skin rash, fevers, chills, night sweats, weight loss, swollen lymph nodes, body aches, joint swelling, chest pain, shortness of breath, mood changes. POSITIVE muscle aches  Objective  Blood pressure 132/82, pulse 80, height 5\' 4"  (1.626 m), weight 176 lb (79.8 kg), SpO2 99%.   General: No apparent distress alert and oriented x3 mood and affect normal, dressed appropriately.  HEENT: Pupils equal, extraocular movements intact  Respiratory: Patient's speak in full sentences and does not appear short of breath  Cardiovascular: No lower extremity edema, non tender, no erythema  Antalgic gait favoring still the right leg.  Mild positive straight leg test noted.  Patient still tender to palpation over the gluteal area and the lateral aspect of the hip.  Has some difficulty even bleeding comfortable with sitting.    Impression and Recommendations:     The above documentation has been reviewed and is accurate and complete Judi Saa, DO

## 2023-02-26 ENCOUNTER — Encounter: Payer: Self-pay | Admitting: Family Medicine

## 2023-02-26 ENCOUNTER — Ambulatory Visit: Payer: Medicare Other | Admitting: Family Medicine

## 2023-02-26 VITALS — BP 132/82 | HR 80 | Ht 64.0 in | Wt 176.0 lb

## 2023-02-26 DIAGNOSIS — M545 Low back pain, unspecified: Secondary | ICD-10-CM | POA: Diagnosis not present

## 2023-02-26 DIAGNOSIS — M25551 Pain in right hip: Secondary | ICD-10-CM | POA: Diagnosis not present

## 2023-02-26 DIAGNOSIS — M5416 Radiculopathy, lumbar region: Secondary | ICD-10-CM | POA: Diagnosis not present

## 2023-02-26 NOTE — Assessment & Plan Note (Signed)
Patient's right hip pain seems to have a differential that is quite broad at the moment.  Did discuss with icing regimen and home exercises.  Patient has failed this as well as formal physical therapy, as well as injections.  Did not seem to make any significant improvement in imaging concern with physical exam today as well that patient could be having more of a lumbar radiculopathy.  Does have degenerative disc disease noted.  Will get MRI to further evaluate for any nerve root impingement depending on see if she is a candidate for this.  We will also get an MRI of the hip to further evaluate if there is any gluteal tearing that could be potentially contributing as well.  Discussed which activities to do and which ones to avoid.  Follow-up again after imaging to discuss further.

## 2023-02-26 NOTE — Patient Instructions (Signed)
Good to see you MRI lumbar and R hip Kathryne Sharper We will be in touch

## 2023-03-03 ENCOUNTER — Encounter: Payer: Self-pay | Admitting: Family Medicine

## 2023-03-09 ENCOUNTER — Ambulatory Visit (INDEPENDENT_AMBULATORY_CARE_PROVIDER_SITE_OTHER): Payer: Medicare Other

## 2023-03-09 DIAGNOSIS — S76011A Strain of muscle, fascia and tendon of right hip, initial encounter: Secondary | ICD-10-CM | POA: Diagnosis not present

## 2023-03-09 DIAGNOSIS — M5116 Intervertebral disc disorders with radiculopathy, lumbar region: Secondary | ICD-10-CM | POA: Diagnosis not present

## 2023-03-09 DIAGNOSIS — M545 Low back pain, unspecified: Secondary | ICD-10-CM

## 2023-03-09 DIAGNOSIS — M4726 Other spondylosis with radiculopathy, lumbar region: Secondary | ICD-10-CM | POA: Diagnosis not present

## 2023-03-09 DIAGNOSIS — M25551 Pain in right hip: Secondary | ICD-10-CM

## 2023-03-09 DIAGNOSIS — M5117 Intervertebral disc disorders with radiculopathy, lumbosacral region: Secondary | ICD-10-CM | POA: Diagnosis not present

## 2023-03-09 DIAGNOSIS — M7601 Gluteal tendinitis, right hip: Secondary | ICD-10-CM | POA: Diagnosis not present

## 2023-03-19 ENCOUNTER — Other Ambulatory Visit: Payer: Self-pay

## 2023-03-19 DIAGNOSIS — M5416 Radiculopathy, lumbar region: Secondary | ICD-10-CM

## 2023-03-24 ENCOUNTER — Other Ambulatory Visit: Payer: Medicare Other

## 2023-03-25 ENCOUNTER — Ambulatory Visit: Admission: RE | Admit: 2023-03-25 | Payer: Medicare Other | Source: Ambulatory Visit

## 2023-03-25 DIAGNOSIS — M5136 Other intervertebral disc degeneration, lumbar region: Secondary | ICD-10-CM | POA: Diagnosis not present

## 2023-03-25 DIAGNOSIS — M5416 Radiculopathy, lumbar region: Secondary | ICD-10-CM

## 2023-03-25 MED ORDER — METHYLPREDNISOLONE ACETATE 40 MG/ML INJ SUSP (RADIOLOG
80.0000 mg | Freq: Once | INTRAMUSCULAR | Status: AC
  Administered 2023-03-25: 80 mg via EPIDURAL

## 2023-03-25 MED ORDER — IOPAMIDOL (ISOVUE-M 200) INJECTION 41%
1.0000 mL | Freq: Once | INTRAMUSCULAR | Status: AC
  Administered 2023-03-25: 1 mL via EPIDURAL

## 2023-03-25 NOTE — Discharge Instructions (Signed)

## 2023-03-25 NOTE — Progress Notes (Signed)
[facility] [address] [place] [address] Phone: [phone] Subjective:   [name], am serving as a scribe for Dr. Antoine Primas.  I'm seeing this patient by the request  of:  Donato Schultz, DO  CC: back pain follow up   [id number]  02/26/2023 Patient's right hip pain seems to have a differential that is quite broad at the moment.  Did discuss with icing regimen and home exercises.  Patient has failed this as well as formal physical therapy, as well as injections.  Did not seem to make any significant improvement in imaging concern with physical exam today as well that patient could be having more of a lumbar radiculopathy.  Does have degenerative disc disease noted.  Will get MRI to further evaluate for any nerve root impingement depending on see if she is a candidate for this.  We will also get an MRI of the hip to further evaluate if there is any gluteal tearing that could be potentially contributing as well.  Discussed which activities to do and which ones to avoid.  Follow-up again after imaging to discuss further.   Updated 03/31/2023 [name] is a 67 y.o. female coming in with complaint of back pain. Epidural was effective. Less pain in lumbar spine and at night her pain is much less. Stairs continue to bother the lateral hip. Pain does not radiate down the leg from the hip   MRI IMPRESSION: 1. At L4-5, a right eccentric disc bulge and facet arthropathy displace the traversing right L5 nerve root in the subarticular zone. 2. At L5-S1, a left central disc protrusion compresses the traversing left S1 nerve root in the subarticular zone.  IMPRESSION: 1. No hip fracture, dislocation or avascular necrosis. 2. Moderate tendinosis of the right gluteus minimus insertion with a small partial-thickness tear.   Epidural L4/5- 8/14    Past Medical History:  Diagnosis Date   Allergic rhinitis    Allergy    Past Surgical  History:  Procedure Laterality Date   BREAST BIOPSY  11-2000   benign   OOPHORECTOMY     VAGINAL HYSTERECTOMY     Social History   Socioeconomic History   Marital status: Married    Spouse name: Not on file   Number of children: 1   Years of education: Not on file   Highest education level: Not on file  Occupational History   Occupation: investment banking  Tobacco Use   Smoking status: Never   Smokeless tobacco: Never  Vaping Use   Vaping status: Never Used  Substance and Sexual Activity   Alcohol use: Yes    Comment: rare   Drug use: No   Sexual activity: Yes    Partners: Male  Other Topics Concern   Not on file  Social History Narrative   Exercise- walking a little at work   Social Determinants of Health   Financial Resource Strain: Low Risk  (09/29/2022)   Overall Financial Resource Strain (CARDIA)    Difficulty of Paying Living Expenses: Not hard at all  Food Insecurity: Not on file  Transportation Needs: No Transportation Needs (09/29/2022)   PRAPARE - Administrator, Civil Service (Medical): No    Lack of Transportation (Non-Medical): No  Physical Activity: Insufficiently Active (09/29/2022)   Exercise Vital Sign    Days of Exercise per Week: 3 days    Minutes of Exercise per Session: 30 min  Stress: No Stress  Concern Present (09/29/2022)   [employer] of Occupational Health - Occupational Stress Questionnaire    Feeling of Stress : Not at all  Social Connections: Socially Integrated (09/29/2022)   Social Connection and Isolation Panel [NHANES]    Frequency of Communication with Friends and Family: More than three times a week    Frequency of Social Gatherings with Friends and Family: Once a week    Attends Religious Services: More than 4 times per year    Active Member of [employer] or Organizations: Yes    Attends Engineer, structural: More than 4 times per year    Marital Status: Married   Allergies  Allergen Reactions    Sulfonamide Derivatives Swelling   Penicillins Rash   Family History  Problem Relation Age of Onset   Breast cancer Mother    Hypertension Mother    Stroke Mother    Pancreatitis Father       Current Outpatient Medications (Respiratory):    fluticasone (FLONASE) 50 MCG/ACT nasal spray, Place 2 sprays into both nostrils daily.    Current Outpatient Medications (Other):    azithromycin (ZITHROMAX Z-PAK) 250 MG tablet, As directed   doxycycline (VIBRAMYCIN) 100 MG capsule, Take 1 capsule (100 mg total) by mouth 2 (two) times daily.   Estriol POWD,    Reviewed prior external information including notes and imaging from  primary care provider As well as notes that were available from care everywhere and other healthcare systems.  Past medical history, social, surgical and family history all reviewed in electronic medical record.  No pertanent information unless stated regarding to the chief complaint.   Review of Systems:  No headache, visual changes, nausea, vomiting, diarrhea, constipation, dizziness, abdominal pain, skin rash, fevers, chills, night sweats, weight loss, swollen lymph nodes, body aches, joint swelling, chest pain, shortness of breath, mood changes. POSITIVE muscle aches  Objective  Blood pressure 116/70, pulse 63, height 5\' 4"  (1.626 m), weight 176 lb (79.8 kg), SpO2 98%.   General: No apparent distress alert and oriented x3 mood and affect normal, dressed appropriately.  HEENT: Pupils equal, extraocular movements intact  Respiratory: Patient's speak in full sentences and does not appear short of breath  Cardiovascular: No lower extremity edema, non tender, no erythema  Noted.  Does have some loss lordosis noted. Tenderness to palpation noted.  Patient patient overwhelmed right.  Area.  4-5 strength compared to the contralateral side.  Procedure: Real-time Ultrasound Guided Injection of right gluteal tendon sheath Device: GE Logiq Q7 Ultrasound guided  injection is preferred based studies that show increased duration, increased effect, greater accuracy, decreased procedural pain, increased response rate, and decreased cost with ultrasound guided versus blind injection.  Verbal informed consent obtained.  Time-out conducted.  Noted no overlying erythema, induration, or other signs of local infection.  Skin prepped in a sterile fashion.  Local anesthesia: Topical Ethyl chloride.  With sterile technique and under real time ultrasound guidance: With a 21-gauge 2 inch needle injected with 0.5 cc of 0.5% Marcaine and 0.5 cc of Kenalog 40 mg/mL Completed without difficulty  Pain immediately resolved suggesting accurate placement of the medication.  Advised to call if fevers/chills, erythema, induration, drainage, or persistent bleeding.  Impression: Technically successful ultrasound guided injection.    Impression and Recommendations:     The above documentation has been reviewed and is accurate and complete Judi Saa, DO

## 2023-03-31 ENCOUNTER — Encounter: Payer: Self-pay | Admitting: Family Medicine

## 2023-03-31 ENCOUNTER — Ambulatory Visit: Payer: Medicare Other | Admitting: Family Medicine

## 2023-03-31 ENCOUNTER — Other Ambulatory Visit: Payer: Self-pay

## 2023-03-31 VITALS — BP 116/70 | HR 63 | Ht 64.0 in | Wt 176.0 lb

## 2023-03-31 DIAGNOSIS — M5416 Radiculopathy, lumbar region: Secondary | ICD-10-CM

## 2023-03-31 DIAGNOSIS — M79604 Pain in right leg: Secondary | ICD-10-CM | POA: Diagnosis not present

## 2023-03-31 DIAGNOSIS — M7601 Gluteal tendinitis, right hip: Secondary | ICD-10-CM | POA: Diagnosis not present

## 2023-03-31 NOTE — Patient Instructions (Addendum)
Injected hip today Ok to start walking 10-15 minutes 3x a week Adding 5 minutes every week See me again in 8 weeks

## 2023-03-31 NOTE — Assessment & Plan Note (Signed)
Patient has responded well to the L4-L5 epidural.  Patient states that 75% of the pain was still away.  Still walking differently at this time.  Discussed icing regimen and home exercises, discussed with patient about hip abductor strengthening and given injection and attending to see if this would help with the rest of the pain but I do feel that the pain was worsening and secondary.  Likely the weakness from the lumbar radiculopathy.  Follow-up with me again in 2 months.

## 2023-04-22 ENCOUNTER — Encounter: Payer: Self-pay | Admitting: Family Medicine

## 2023-04-23 ENCOUNTER — Other Ambulatory Visit: Payer: Self-pay

## 2023-04-23 DIAGNOSIS — Z01419 Encounter for gynecological examination (general) (routine) without abnormal findings: Secondary | ICD-10-CM | POA: Diagnosis not present

## 2023-04-23 DIAGNOSIS — M5416 Radiculopathy, lumbar region: Secondary | ICD-10-CM

## 2023-04-23 DIAGNOSIS — Z1231 Encounter for screening mammogram for malignant neoplasm of breast: Secondary | ICD-10-CM | POA: Diagnosis not present

## 2023-04-27 ENCOUNTER — Other Ambulatory Visit: Payer: Self-pay | Admitting: Obstetrics and Gynecology

## 2023-04-27 DIAGNOSIS — R928 Other abnormal and inconclusive findings on diagnostic imaging of breast: Secondary | ICD-10-CM

## 2023-04-28 DIAGNOSIS — L821 Other seborrheic keratosis: Secondary | ICD-10-CM | POA: Diagnosis not present

## 2023-04-28 DIAGNOSIS — D225 Melanocytic nevi of trunk: Secondary | ICD-10-CM | POA: Diagnosis not present

## 2023-04-28 DIAGNOSIS — D2261 Melanocytic nevi of right upper limb, including shoulder: Secondary | ICD-10-CM | POA: Diagnosis not present

## 2023-04-30 NOTE — Discharge Instructions (Signed)

## 2023-05-01 ENCOUNTER — Ambulatory Visit
Admission: RE | Admit: 2023-05-01 | Discharge: 2023-05-01 | Disposition: A | Discharge status: Home / Self Care | Payer: Medicare Other | Source: Ambulatory Visit | Attending: Family Medicine | Admitting: Family Medicine

## 2023-05-01 DIAGNOSIS — M4727 Other spondylosis with radiculopathy, lumbosacral region: Secondary | ICD-10-CM | POA: Diagnosis not present

## 2023-05-01 DIAGNOSIS — M5416 Radiculopathy, lumbar region: Secondary | ICD-10-CM

## 2023-05-01 MED ORDER — METHYLPREDNISOLONE ACETATE 40 MG/ML INJ SUSP (RADIOLOG
80.0000 mg | Freq: Once | INTRAMUSCULAR | Status: AC
  Administered 2023-05-01: 80 mg via EPIDURAL

## 2023-05-01 MED ORDER — IOPAMIDOL (ISOVUE-M 200) INJECTION 41%
1.0000 mL | Freq: Once | INTRAMUSCULAR | Status: AC
  Administered 2023-05-01: 1 mL via EPIDURAL

## 2023-05-08 ENCOUNTER — Ambulatory Visit
Admission: RE | Admit: 2023-05-08 | Discharge: 2023-05-08 | Disposition: A | Discharge status: Home / Self Care | Payer: Medicare Other | Source: Ambulatory Visit | Attending: Obstetrics and Gynecology | Admitting: Obstetrics and Gynecology

## 2023-05-08 ENCOUNTER — Ambulatory Visit: Payer: Medicare Other

## 2023-05-08 DIAGNOSIS — R928 Other abnormal and inconclusive findings on diagnostic imaging of breast: Secondary | ICD-10-CM

## 2023-05-08 DIAGNOSIS — Z803 Family history of malignant neoplasm of breast: Secondary | ICD-10-CM | POA: Diagnosis not present

## 2023-05-27 NOTE — Progress Notes (Unsigned)
[facility] [address] [place] [address] Phone: [phone] Subjective:   [name], am serving as a scribe for Dr. Antoine Primas.  I'm seeing this patient by the request  of:  Donato Schultz, DO  CC: right low back pain   [id number]  03/31/2023 Patient has responded well to the L4-L5 epidural. Patient states that 75% of the pain was still away. Still walking differently at this time. Discussed icing regimen and home exercises, discussed with patient about hip abductor strengthening and given injection and attending to see if this would help with the rest of the pain but I do feel that the pain was worsening and secondary. Likely the weakness from the lumbar radiculopathy. Follow-up with me again in 2 months.   Update 05/28/2023 [name] is a 67 y.o. female coming in with complaint of R lumbar spine pain. Epidural on 05/01/2023. Patient states that the epidural has been helpful. Able to walk without pain. Continues to have pain with lumbar flexion. Most of her pain is on R side. Stair climbing increases pain in R quad.        Past Medical History:  Diagnosis Date   Allergic rhinitis    Allergy    Past Surgical History:  Procedure Laterality Date   BREAST BIOPSY  11-2000   benign   OOPHORECTOMY     VAGINAL HYSTERECTOMY     Social History   Socioeconomic History   Marital status: Married    Spouse name: Not on file   Number of children: 1   Years of education: Not on file   Highest education level: Not on file  Occupational History   Occupation: investment banking  Tobacco Use   Smoking status: Never   Smokeless tobacco: Never  Vaping Use   Vaping status: Never Used  Substance and Sexual Activity   Alcohol use: Yes    Comment: rare   Drug use: No   Sexual activity: Yes    Partners: Male  Other Topics Concern   Not on file  Social History Narrative   Exercise- walking a little at work   Social  Determinants of Health   Financial Resource Strain: Low Risk  (09/29/2022)   Overall Financial Resource Strain (CARDIA)    Difficulty of Paying Living Expenses: Not hard at all  Food Insecurity: Not on file  Transportation Needs: No Transportation Needs (09/29/2022)   PRAPARE - Administrator, Civil Service (Medical): No    Lack of Transportation (Non-Medical): No  Physical Activity: Insufficiently Active (09/29/2022)   Exercise Vital Sign    Days of Exercise per Week: 3 days    Minutes of Exercise per Session: 30 min  Stress: No Stress Concern Present (09/29/2022)   [employer] of Occupational Health - Occupational Stress Questionnaire    Feeling of Stress : Not at all  Social Connections: Socially Integrated (09/29/2022)   Social Connection and Isolation Panel [NHANES]    Frequency of Communication with Friends and Family: More than three times a week    Frequency of Social Gatherings with Friends and Family: Once a week    Attends Religious Services: More than 4 times per year    Active Member of [employer] or Organizations: Yes    Attends Engineer, structural: More than 4 times per year    Marital Status: Married   Allergies  Allergen Reactions   Sulfonamide Derivatives Swelling   Penicillins Rash  Family History  Problem Relation Age of Onset   Breast cancer Mother    Hypertension Mother    Stroke Mother    Pancreatitis Father       Current Outpatient Medications (Respiratory):    fluticasone (FLONASE) 50 MCG/ACT nasal spray, Place 2 sprays into both nostrils daily.    Current Outpatient Medications (Other):    azithromycin (ZITHROMAX Z-PAK) 250 MG tablet, As directed   doxycycline (VIBRAMYCIN) 100 MG capsule, Take 1 capsule (100 mg total) by mouth 2 (two) times daily.   Estriol POWD,    Reviewed prior external information including notes and imaging from  primary care provider As well as notes that were available from care everywhere and  other healthcare systems.  Past medical history, social, surgical and family history all reviewed in electronic medical record.  No pertanent information unless stated regarding to the chief complaint.   Review of Systems:  No headache, visual changes, nausea, vomiting, diarrhea, constipation, dizziness, abdominal pain, skin rash, fevers, chills, night sweats, weight loss, swollen lymph nodes, body aches, joint swelling, chest pain, shortness of breath, mood changes. POSITIVE muscle aches  Objective  Blood pressure 110/82, pulse 79, height 5\' 4"  (1.626 m), weight 180 lb (81.6 kg), SpO2 98%.   General: No apparent distress alert and oriented x3 mood and affect normal, dressed appropriately.  HEENT: Pupils equal, extraocular movements intact  Respiratory: Patient's speak in full sentences and does not appear short of breath  Cardiovascular: No lower extremity edema, non tender, no erythema  Right low back pain does have tightness noted.  Patient does have tightness with straight leg test but no true radicular symptoms.  Some arthritic changes noted otherwise.  Patient still has some loss of extension of the back noted at this time.    Impression and Recommendations:    The above documentation has been reviewed and is accurate and complete Judi Saa, DO

## 2023-05-28 ENCOUNTER — Encounter: Payer: Self-pay | Admitting: Family Medicine

## 2023-05-28 ENCOUNTER — Ambulatory Visit: Payer: Medicare Other | Admitting: Family Medicine

## 2023-05-28 DIAGNOSIS — M5416 Radiculopathy, lumbar region: Secondary | ICD-10-CM

## 2023-05-28 NOTE — Assessment & Plan Note (Signed)
Likely radicular symptoms seem to be better.  Will start patient though on Cymbalta with patient continuing to have chronic discomfort.  Hopeful that this will make some improvement.  Warned of potential side effects.  Discussed icing regimen and home exercises otherwise.  Continue to work on core strengthening.  Follow-up with me again in 2 to 3 months.

## 2023-05-28 NOTE — Patient Instructions (Signed)
Cymbalta 20mg  Continue to stay active Send message in 2 weeks See you again in 2 months

## 2023-05-29 ENCOUNTER — Encounter: Payer: Self-pay | Admitting: Family Medicine

## 2023-06-01 ENCOUNTER — Other Ambulatory Visit: Payer: Self-pay

## 2023-06-01 MED ORDER — DULOXETINE HCL 20 MG PO CPEP: 20.0000 mg | ORAL_CAPSULE | Freq: Every day | ORAL | 0 refills | Status: DC

## 2023-06-17 ENCOUNTER — Encounter: Payer: Self-pay | Admitting: Family Medicine

## 2023-06-18 ENCOUNTER — Other Ambulatory Visit: Payer: Self-pay

## 2023-06-18 DIAGNOSIS — M5416 Radiculopathy, lumbar region: Secondary | ICD-10-CM

## 2023-06-29 DIAGNOSIS — Z6830 Body mass index (BMI) 30.0-30.9, adult: Secondary | ICD-10-CM | POA: Diagnosis not present

## 2023-06-29 DIAGNOSIS — M5416 Radiculopathy, lumbar region: Secondary | ICD-10-CM | POA: Diagnosis not present

## 2023-07-01 ENCOUNTER — Encounter: Payer: Self-pay | Admitting: Family Medicine

## 2023-07-01 ENCOUNTER — Other Ambulatory Visit: Payer: Self-pay | Admitting: Neurological Surgery

## 2023-07-23 ENCOUNTER — Ambulatory Visit: Payer: Medicare Other | Admitting: Family Medicine

## 2023-07-23 NOTE — Pre-Procedure Instructions (Signed)
Surgical Instructions   Your procedure is scheduled on Thursday, December 19th. Report to [facility] Main Entrance "A" at 09:50 A.M., then check in with the Admitting office. Any questions or running late day of surgery: call [phone]  Questions prior to your surgery date: call [phone], Monday-Friday, 8am-4pm. If you experience any cold or flu symptoms such as cough, fever, chills, shortness of breath, etc. between now and your scheduled surgery, please notify us at the above number.     Remember:  Do not eat or drink after midnight the night before your surgery    Take these medicines the morning of surgery with A SIP OF WATER  May take these medicines IF NEEDED: fluticasone (FLONASE)    One week prior to surgery, STOP taking any Aspirin (unless otherwise instructed by your surgeon) Aleve, Naproxen, Ibuprofen, Motrin, Advil, Goody's, BC's, all herbal medications, fish oil, and non-prescription vitamins.                     Do NOT Smoke (Tobacco/Vaping) for 24 hours prior to your procedure.  If you use a CPAP at night, you may bring your mask/headgear for your overnight stay.   You will be asked to remove any contacts, glasses, piercing's, hearing aid's, dentures/partials prior to surgery. Please bring cases for these items if needed.    Patients discharged the day of surgery will not be allowed to drive home, and someone needs to stay with them for 24 hours.  SURGICAL WAITING ROOM VISITATION Patients may have no more than 2 support people in the waiting area - these visitors may rotate.   Pre-op nurse will coordinate an appropriate time for 1 ADULT support person, who may not rotate, to accompany patient in pre-op.  Children under the age of 7 must have an adult with them who is not the patient and must remain in the main waiting area with an adult.  If the patient needs to stay at the hospital during part of their recovery, the visitor guidelines for inpatient rooms  apply.  Please refer to the [facility] website for the visitor guidelines for any additional information.   If you received a COVID test during your pre-op visit  it is requested that you wear a mask when out in public, stay away from anyone that may not be feeling well and notify your surgeon if you develop symptoms. If you have been in contact with anyone that has tested positive in the last 10 days please notify you surgeon.      Pre-operative 5 CHG Bathing Instructions   You can play a key role in reducing the risk of infection after surgery. Your skin needs to be as free of germs as possible. You can reduce the number of germs on your skin by washing with CHG (chlorhexidine gluconate) soap before surgery. CHG is an antiseptic soap that kills germs and continues to kill germs even after washing.   DO NOT use if you have an allergy to chlorhexidine/CHG or antibacterial soaps. If your skin becomes reddened or irritated, stop using the CHG and notify one of our RNs at [phone].   Please shower with the CHG soap starting 4 days before surgery using the following schedule:     Please keep in mind the following:  DO NOT shave, including legs and underarms, starting the day of your first shower.   You may shave your face at any point before/day of surgery.  Place clean sheets on your  bed the day you start using CHG soap. Use a clean washcloth (not used since being washed) for each shower. DO NOT sleep with pets once you start using the CHG.   CHG Shower Instructions:  Wash your face and private area with normal soap. If you choose to wash your hair, wash first with your normal shampoo.  After you use shampoo/soap, rinse your hair and body thoroughly to remove shampoo/soap residue.  Turn the water OFF and apply about 3 tablespoons (45 ml) of CHG soap to a CLEAN washcloth.  Apply CHG soap ONLY FROM YOUR NECK DOWN TO YOUR TOES (washing for 3-5 minutes)  DO NOT use CHG soap on face,  private areas, open wounds, or sores.  Pay special attention to the area where your surgery is being performed.  If you are having back surgery, having someone wash your back for you may be helpful. Wait 2 minutes after CHG soap is applied, then you may rinse off the CHG soap.  Pat dry with a clean towel  Put on clean clothes/pajamas   If you choose to wear lotion, please use ONLY the CHG-compatible lotions on the back of this paper.   Additional instructions for the day of surgery: DO NOT APPLY any lotions, deodorants, cologne, or perfumes.   Do not bring valuables to the hospital. [facility] is not responsible for any belongings/valuables. Do not wear nail polish, gel polish, artificial nails, or any other type of covering on natural nails (fingers and toes) Do not wear jewelry or makeup Put on clean/comfortable clothes.  Please brush your teeth.  Ask your nurse before applying any prescription medications to the skin.     CHG Compatible Lotions   Aveeno Moisturizing lotion  Cetaphil Moisturizing Cream  Cetaphil Moisturizing Lotion  Clairol Herbal Essence Moisturizing Lotion, Dry Skin  Clairol Herbal Essence Moisturizing Lotion, Extra Dry Skin  Clairol Herbal Essence Moisturizing Lotion, Normal Skin  Curel Age Defying Therapeutic Moisturizing Lotion with Alpha Hydroxy  Curel Extreme Care Body Lotion  Curel Soothing Hands Moisturizing Hand Lotion  Curel Therapeutic Moisturizing Cream, Fragrance-Free  Curel Therapeutic Moisturizing Lotion, Fragrance-Free  Curel Therapeutic Moisturizing Lotion, Original Formula  Eucerin Daily Replenishing Lotion  Eucerin Dry Skin Therapy Plus Alpha Hydroxy Crme  Eucerin Dry Skin Therapy Plus Alpha Hydroxy Lotion  Eucerin Original Crme  Eucerin Original Lotion  Eucerin Plus Crme Eucerin Plus Lotion  Eucerin TriLipid Replenishing Lotion  Keri Anti-Bacterial Hand Lotion  Keri Deep Conditioning Original Lotion Dry Skin Formula Softly  Scented  Keri Deep Conditioning Original Lotion, Fragrance Free Sensitive Skin Formula  Keri Lotion Fast Absorbing Fragrance Free Sensitive Skin Formula  Keri Lotion Fast Absorbing Softly Scented Dry Skin Formula  Keri Original Lotion  Keri Skin Renewal Lotion Keri Silky Smooth Lotion  Keri Silky Smooth Sensitive Skin Lotion  Nivea Body Creamy Conditioning Oil  Nivea Body Extra Enriched Lotion  Nivea Body Original Lotion  Nivea Body Sheer Moisturizing Lotion Nivea Crme  Nivea Skin Firming Lotion  NutraDerm 30 Skin Lotion  NutraDerm Skin Lotion  NutraDerm Therapeutic Skin Cream  NutraDerm Therapeutic Skin Lotion  ProShield Protective Hand Cream  Provon moisturizing lotion  Please read over the following fact sheets that you were given.

## 2023-07-24 ENCOUNTER — Other Ambulatory Visit: Payer: Self-pay

## 2023-07-24 ENCOUNTER — Encounter (HOSPITAL_COMMUNITY): Payer: Self-pay

## 2023-07-24 ENCOUNTER — Encounter (HOSPITAL_COMMUNITY)
Admission: RE | Admit: 2023-07-24 | Discharge: 2023-07-24 | Disposition: A | Discharge status: Home / Self Care | Payer: Medicare Other | Source: Ambulatory Visit | Attending: Neurological Surgery

## 2023-07-24 VITALS — BP 115/70 | HR 78 | Temp 98.4°F | Resp 18 | Ht 64.0 in | Wt 181.4 lb

## 2023-07-24 DIAGNOSIS — Z01818 Encounter for other preprocedural examination: Secondary | ICD-10-CM | POA: Insufficient documentation

## 2023-07-24 HISTORY — DX: Other specified postprocedural states: R11.2

## 2023-07-24 HISTORY — DX: Other specified postprocedural states: Z98.890

## 2023-07-24 LAB — CBC
HCT: 40.8 % (ref 36.0–46.0)
Hemoglobin: 13.8 g/dL (ref 12.0–15.0)
MCH: 32.5 pg (ref 26.0–34.0)
MCHC: 33.8 g/dL (ref 30.0–36.0)
MCV: 96.2 fL (ref 80.0–100.0)
Platelets: 242 10*3/uL (ref 150–400)
RBC: 4.24 MIL/uL (ref 3.87–5.11)
RDW: 13 % (ref 11.5–15.5)
WBC: 5.8 10*3/uL (ref 4.0–10.5)
nRBC: 0 % (ref 0.0–0.2)

## 2023-07-24 LAB — SURGICAL PCR SCREEN
MRSA, PCR: NEGATIVE
Staphylococcus aureus: NEGATIVE

## 2023-07-24 NOTE — Progress Notes (Signed)
PCP -  Donato Schultz, DO  Cardiologist -  denies  PPM/ICD - denies Device Orders - n/a Rep Notified - n/a  Chest x-ray - denies EKG - 09-29-22 Stress Test - denies ECHO - denies Cardiac Cath - denies  Sleep Study - denies CPAP -   DM- denies Blood Thinner Instructions:denies Aspirin Instructions:n.a  ERAS Protcol - NPO   COVID TEST- na   Anesthesia review: no  Patient denies shortness of breath, fever, cough and chest pain at PAT appointment   All instructions explained to the patient, with a verbal understanding of the material. Patient agrees to go over the instructions while at home for a better understanding. Patient also instructed to self quarantine after being tested for COVID-19. The opportunity to ask questions was provided.

## 2023-07-30 ENCOUNTER — Encounter (HOSPITAL_COMMUNITY)
Admission: RE | Disposition: A | Discharge status: Home / Self Care | Payer: Self-pay | Source: Home / Self Care | Attending: Neurological Surgery

## 2023-07-30 ENCOUNTER — Ambulatory Visit (HOSPITAL_COMMUNITY): Payer: Medicare Other

## 2023-07-30 ENCOUNTER — Observation Stay (HOSPITAL_COMMUNITY)
Admission: RE | Admit: 2023-07-30 | Discharge: 2023-07-30 | Disposition: A | Discharge status: Home / Self Care | Payer: Medicare Other | Attending: Neurological Surgery | Admitting: Neurological Surgery

## 2023-07-30 ENCOUNTER — Other Ambulatory Visit: Payer: Self-pay

## 2023-07-30 ENCOUNTER — Ambulatory Visit (HOSPITAL_COMMUNITY): Payer: Medicare Other | Admitting: Certified Registered Nurse Anesthetist

## 2023-07-30 ENCOUNTER — Ambulatory Visit (HOSPITAL_BASED_OUTPATIENT_CLINIC_OR_DEPARTMENT_OTHER): Payer: Medicare Other | Admitting: Certified Registered Nurse Anesthetist

## 2023-07-30 ENCOUNTER — Encounter (HOSPITAL_COMMUNITY): Payer: Self-pay | Admitting: Neurological Surgery

## 2023-07-30 DIAGNOSIS — M48061 Spinal stenosis, lumbar region without neurogenic claudication: Secondary | ICD-10-CM | POA: Diagnosis not present

## 2023-07-30 DIAGNOSIS — M5416 Radiculopathy, lumbar region: Secondary | ICD-10-CM | POA: Diagnosis not present

## 2023-07-30 DIAGNOSIS — M5116 Intervertebral disc disorders with radiculopathy, lumbar region: Secondary | ICD-10-CM | POA: Diagnosis not present

## 2023-07-30 HISTORY — PX: LUMBAR LAMINECTOMY/ DECOMPRESSION WITH MET-RX: SHX5959

## 2023-07-30 SURGERY — LUMBAR LAMINECTOMY/ DECOMPRESSION WITH MET-RX
Anesthesia: General | Laterality: Right

## 2023-07-30 MED ORDER — HYDROCODONE-ACETAMINOPHEN 5-325 MG PO TABS: 1.0000 | ORAL_TABLET | ORAL | Status: DC | PRN

## 2023-07-30 MED ORDER — MIDAZOLAM HCL 5 MG/5ML IJ SOLN
INTRAMUSCULAR | Status: DC | PRN
  Administered 2023-07-30: 2 mg via INTRAVENOUS

## 2023-07-30 MED ORDER — SODIUM CHLORIDE 0.9% FLUSH: 3.0000 mL | INTRAVENOUS | Status: DC | PRN

## 2023-07-30 MED ORDER — ACETAMINOPHEN 500 MG PO TABS
1000.0000 mg | ORAL_TABLET | Freq: Once | ORAL | Status: AC
Start: 2023-07-30 — End: 2023-07-30
  Administered 2023-07-30: 1000 mg via ORAL

## 2023-07-30 MED ORDER — ONDANSETRON HCL 4 MG/2ML IJ SOLN
INTRAMUSCULAR | Status: AC
  Filled 2023-07-30: qty 2

## 2023-07-30 MED ORDER — LIDOCAINE 2% (20 MG/ML) 5 ML SYRINGE
INTRAMUSCULAR | Status: DC | PRN
  Administered 2023-07-30: 60 mg via INTRAVENOUS

## 2023-07-30 MED ORDER — KETOROLAC TROMETHAMINE 15 MG/ML IJ SOLN: 7.5000 mg | Freq: Four times a day (QID) | INTRAMUSCULAR | Status: DC

## 2023-07-30 MED ORDER — ORAL CARE MOUTH RINSE
15.0000 mL | Freq: Once | OROMUCOSAL | Status: AC
Start: 2023-07-30 — End: 2023-07-30

## 2023-07-30 MED ORDER — LIDOCAINE-EPINEPHRINE 1 %-1:100000 IJ SOLN
INTRAMUSCULAR | Status: DC | PRN
  Administered 2023-07-30: 5 mL

## 2023-07-30 MED ORDER — THROMBIN 5000 UNITS EX SOLR
CUTANEOUS | Status: AC
  Filled 2023-07-30: qty 5000

## 2023-07-30 MED ORDER — PROPOFOL 1000 MG/100ML IV EMUL
INTRAVENOUS | Status: AC
  Filled 2023-07-30: qty 100

## 2023-07-30 MED ORDER — LIDOCAINE 2% (20 MG/ML) 5 ML SYRINGE
INTRAMUSCULAR | Status: AC
  Filled 2023-07-30: qty 5

## 2023-07-30 MED ORDER — PROPOFOL 10 MG/ML IV BOLUS
INTRAVENOUS | Status: AC
Start: 2023-07-30 — End: ?
  Filled 2023-07-30: qty 20

## 2023-07-30 MED ORDER — DEXAMETHASONE SODIUM PHOSPHATE 10 MG/ML IJ SOLN
INTRAMUSCULAR | Status: DC | PRN
  Administered 2023-07-30: 10 mg via INTRAVENOUS

## 2023-07-30 MED ORDER — HYDROMORPHONE HCL 1 MG/ML IJ SOLN: 0.2500 mg | INTRAMUSCULAR | Status: DC | PRN

## 2023-07-30 MED ORDER — CEFAZOLIN SODIUM-DEXTROSE 2-4 GM/100ML-% IV SOLN
INTRAVENOUS | Status: AC
  Filled 2023-07-30: qty 100

## 2023-07-30 MED ORDER — ACETAMINOPHEN 325 MG PO TABS: 650.0000 mg | ORAL_TABLET | ORAL | Status: DC | PRN

## 2023-07-30 MED ORDER — FENTANYL CITRATE (PF) 250 MCG/5ML IJ SOLN
INTRAMUSCULAR | Status: DC | PRN
  Administered 2023-07-30 (×5): 50 ug via INTRAVENOUS

## 2023-07-30 MED ORDER — THROMBIN 5000 UNITS EX SOLR
OROMUCOSAL | Status: DC | PRN
  Administered 2023-07-30: 5 mL via TOPICAL

## 2023-07-30 MED ORDER — HYDROMORPHONE HCL 1 MG/ML IJ SOLN: 0.5000 mg | INTRAMUSCULAR | Status: DC | PRN

## 2023-07-30 MED ORDER — GELATIN ABSORBABLE 12-7 MM EX MISC
CUTANEOUS | Status: DC | PRN
  Administered 2023-07-30: 1 via TOPICAL

## 2023-07-30 MED ORDER — PHENOL 1.4 % MT LIQD: 1.0000 | OROMUCOSAL | Status: DC | PRN

## 2023-07-30 MED ORDER — 0.9 % SODIUM CHLORIDE (POUR BTL) OPTIME
TOPICAL | Status: DC | PRN
  Administered 2023-07-30: 1000 mL

## 2023-07-30 MED ORDER — MENTHOL 3 MG MT LOZG: 1.0000 | LOZENGE | OROMUCOSAL | Status: DC | PRN

## 2023-07-30 MED ORDER — ONDANSETRON HCL 4 MG PO TABS: 4.0000 mg | ORAL_TABLET | Freq: Four times a day (QID) | ORAL | Status: DC | PRN

## 2023-07-30 MED ORDER — DEXAMETHASONE SODIUM PHOSPHATE 10 MG/ML IJ SOLN
INTRAMUSCULAR | Status: AC
  Filled 2023-07-30: qty 1

## 2023-07-30 MED ORDER — CHLORHEXIDINE GLUCONATE CLOTH 2 % EX PADS: 6.0000 | MEDICATED_PAD | Freq: Once | CUTANEOUS | Status: DC

## 2023-07-30 MED ORDER — FENTANYL CITRATE (PF) 250 MCG/5ML IJ SOLN
INTRAMUSCULAR | Status: AC
  Filled 2023-07-30: qty 5

## 2023-07-30 MED ORDER — METHOCARBAMOL 750 MG PO TABS: 750.0000 mg | ORAL_TABLET | Freq: Four times a day (QID) | ORAL | 0 refills | Status: AC

## 2023-07-30 MED ORDER — BUPIVACAINE-EPINEPHRINE (PF) 0.5% -1:200000 IJ SOLN
INTRAMUSCULAR | Status: AC
  Filled 2023-07-30: qty 30

## 2023-07-30 MED ORDER — BUPIVACAINE-EPINEPHRINE (PF) 0.5% -1:200000 IJ SOLN
INTRAMUSCULAR | Status: DC | PRN
  Administered 2023-07-30: 5 mL

## 2023-07-30 MED ORDER — PROPOFOL 500 MG/50ML IV EMUL
INTRAVENOUS | Status: DC | PRN
  Administered 2023-07-30: 150 ug/kg/min via INTRAVENOUS

## 2023-07-30 MED ORDER — LIDOCAINE-EPINEPHRINE 1 %-1:100000 IJ SOLN
INTRAMUSCULAR | Status: AC
  Filled 2023-07-30: qty 1

## 2023-07-30 MED ORDER — LACTATED RINGERS IV SOLN: INTRAVENOUS | Status: DC | PRN

## 2023-07-30 MED ORDER — SODIUM CHLORIDE 0.9% FLUSH: 3.0000 mL | Freq: Two times a day (BID) | INTRAVENOUS | Status: DC

## 2023-07-30 MED ORDER — MIDAZOLAM HCL 2 MG/2ML IJ SOLN
INTRAMUSCULAR | Status: AC
  Filled 2023-07-30: qty 2

## 2023-07-30 MED ORDER — POLYETHYLENE GLYCOL 3350 17 G PO PACK
17.0000 g | PACK | Freq: Every day | ORAL | Status: DC | PRN
Start: 2023-07-30 — End: 2023-07-31

## 2023-07-30 MED ORDER — PROPOFOL 10 MG/ML IV BOLUS
INTRAVENOUS | Status: DC | PRN
  Administered 2023-07-30: 100 mg via INTRAVENOUS

## 2023-07-30 MED ORDER — DOCUSATE SODIUM 100 MG PO CAPS: 100.0000 mg | ORAL_CAPSULE | Freq: Two times a day (BID) | ORAL | 0 refills | Status: DC

## 2023-07-30 MED ORDER — VANCOMYCIN HCL IN DEXTROSE 1-5 GM/200ML-% IV SOLN
1000.0000 mg | INTRAVENOUS | Status: DC
  Filled 2023-07-30: qty 200

## 2023-07-30 MED ORDER — HYDROCODONE-ACETAMINOPHEN 5-325 MG PO TABS: 1.0000 | ORAL_TABLET | ORAL | 0 refills | Status: DC | PRN

## 2023-07-30 MED ORDER — METHYLPREDNISOLONE ACETATE 80 MG/ML IJ SUSP
INTRAMUSCULAR | Status: DC | PRN
  Administered 2023-07-30: 40 mg

## 2023-07-30 MED ORDER — FENTANYL CITRATE (PF) 100 MCG/2ML IJ SOLN
INTRAMUSCULAR | Status: DC | PRN
  Administered 2023-07-30: 50 ug via INTRAVENOUS

## 2023-07-30 MED ORDER — SUGAMMADEX SODIUM 200 MG/2ML IV SOLN
INTRAVENOUS | Status: DC | PRN
  Administered 2023-07-30: 200 mg via INTRAVENOUS

## 2023-07-30 MED ORDER — ACETAMINOPHEN 500 MG PO TABS
ORAL_TABLET | ORAL | Status: AC
  Filled 2023-07-30: qty 2

## 2023-07-30 MED ORDER — ACETAMINOPHEN 650 MG RE SUPP: 650.0000 mg | RECTAL | Status: DC | PRN

## 2023-07-30 MED ORDER — VANCOMYCIN HCL IN DEXTROSE 1-5 GM/200ML-% IV SOLN: 1000.0000 mg | Freq: Once | INTRAVENOUS | Status: DC

## 2023-07-30 MED ORDER — METHYLPREDNISOLONE ACETATE 80 MG/ML IJ SUSP
INTRAMUSCULAR | Status: AC
  Filled 2023-07-30: qty 1

## 2023-07-30 MED ORDER — DOCUSATE SODIUM 100 MG PO CAPS: 100.0000 mg | ORAL_CAPSULE | Freq: Two times a day (BID) | ORAL | Status: DC

## 2023-07-30 MED ORDER — CHLORHEXIDINE GLUCONATE 0.12 % MT SOLN
15.0000 mL | Freq: Once | OROMUCOSAL | Status: AC
  Administered 2023-07-30: 15 mL via OROMUCOSAL
  Filled 2023-07-30: qty 15

## 2023-07-30 MED ORDER — METHOCARBAMOL 500 MG PO TABS
500.0000 mg | ORAL_TABLET | Freq: Four times a day (QID) | ORAL | Status: DC | PRN
Start: 2023-07-30 — End: 2023-07-30

## 2023-07-30 MED ORDER — ONDANSETRON HCL 4 MG/2ML IJ SOLN
INTRAMUSCULAR | Status: DC | PRN
  Administered 2023-07-30: 4 mg via INTRAVENOUS

## 2023-07-30 MED ORDER — ROCURONIUM BROMIDE 10 MG/ML (PF) SYRINGE
PREFILLED_SYRINGE | INTRAVENOUS | Status: DC | PRN
  Administered 2023-07-30: 20 mg via INTRAVENOUS
  Administered 2023-07-30: 60 mg via INTRAVENOUS

## 2023-07-30 MED ORDER — METHOCARBAMOL 1000 MG/10ML IJ SOLN: 500.0000 mg | Freq: Four times a day (QID) | INTRAMUSCULAR | Status: DC | PRN

## 2023-07-30 MED ORDER — ONDANSETRON HCL 4 MG/2ML IJ SOLN: 4.0000 mg | Freq: Four times a day (QID) | INTRAMUSCULAR | Status: DC | PRN

## 2023-07-30 MED ORDER — VANCOMYCIN HCL IN DEXTROSE 1-5 GM/200ML-% IV SOLN
INTRAVENOUS | Status: AC
  Administered 2023-07-30: 1000 mg via INTRAVENOUS
  Filled 2023-07-30: qty 200

## 2023-07-30 MED ORDER — ROCURONIUM BROMIDE 10 MG/ML (PF) SYRINGE
PREFILLED_SYRINGE | INTRAVENOUS | Status: AC
  Filled 2023-07-30: qty 10

## 2023-07-30 MED ORDER — FENTANYL CITRATE (PF) 100 MCG/2ML IJ SOLN
INTRAMUSCULAR | Status: AC
  Filled 2023-07-30: qty 2

## 2023-07-30 MED ORDER — HYDROCODONE-ACETAMINOPHEN 5-325 MG PO TABS: 2.0000 | ORAL_TABLET | ORAL | Status: DC | PRN

## 2023-07-30 MED ORDER — CEFAZOLIN SODIUM-DEXTROSE 2-4 GM/100ML-% IV SOLN: 2.0000 g | Freq: Once | INTRAVENOUS | Status: DC

## 2023-07-30 SURGICAL SUPPLY — 54 items
BAG COUNTER SPONGE SURGICOUNT (BAG) ×1 IMPLANT
BLADE SURG 11 STRL SS (BLADE) ×1 IMPLANT
BUR MATCHSTICK NEURO 3.0 LAGG (BURR) ×1 IMPLANT
CNTNR URN SCR LID CUP LEK RST (MISCELLANEOUS) IMPLANT
COVER BACK TABLE 60X90IN (DRAPES) ×1 IMPLANT
COVER MAYO STAND STRL (DRAPES) ×1 IMPLANT
DERMABOND ADVANCED .7 DNX12 (GAUZE/BANDAGES/DRESSINGS) ×1 IMPLANT
DRAIN JACKSON RD 7FR 3/32 (WOUND CARE) IMPLANT
DRAPE C-ARM 42X72 X-RAY (DRAPES) ×1 IMPLANT
DRAPE LAPAROTOMY 100X72X124 (DRAPES) ×1 IMPLANT
DRAPE MICROSCOPE SLANT 54X150 (MISCELLANEOUS) ×1 IMPLANT
DRAPE SURG 17X23 STRL (DRAPES) ×1 IMPLANT
DRSG OPSITE POSTOP 3X4 (GAUZE/BANDAGES/DRESSINGS) ×1 IMPLANT
DURAPREP 26ML APPLICATOR (WOUND CARE) ×1 IMPLANT
ELECT BLADE 6.5 EXT (BLADE) ×1 IMPLANT
ELECT BLADE INSULATED 6.5IN (ELECTROSURGICAL) ×1 IMPLANT
ELECT COATED BLADE 2.86 ST (ELECTRODE) ×1 IMPLANT
ELECT REM PT RETURN 9FT ADLT (ELECTROSURGICAL) ×1 IMPLANT
ELECTRODE BLDE INSULATED 6.5IN (ELECTROSURGICAL) ×1 IMPLANT
ELECTRODE REM PT RTRN 9FT ADLT (ELECTROSURGICAL) ×1 IMPLANT
GAUZE 4X4 16PLY ~~LOC~~+RFID DBL (SPONGE) IMPLANT
GLOVE BIO SURGEON STRL SZ7 (GLOVE) ×1 IMPLANT
GLOVE BIOGEL PI IND STRL 7.5 (GLOVE) ×1 IMPLANT
GLOVE BIOGEL PI IND STRL 8 (GLOVE) ×2 IMPLANT
GLOVE ECLIPSE 8.0 STRL XLNG CF (GLOVE) ×2 IMPLANT
GOWN STRL REUS W/ TWL LRG LVL3 (GOWN DISPOSABLE) IMPLANT
GOWN STRL REUS W/ TWL XL LVL3 (GOWN DISPOSABLE) ×2 IMPLANT
GOWN STRL REUS W/TWL 2XL LVL3 (GOWN DISPOSABLE) IMPLANT
HEMOSTAT POWDER KIT SURGIFOAM (HEMOSTASIS) ×1 IMPLANT
KIT BASIN OR (CUSTOM PROCEDURE TRAY) ×1 IMPLANT
KIT POSITION SURG JACKSON T1 (MISCELLANEOUS) ×1 IMPLANT
KIT TURNOVER KIT B (KITS) ×1 IMPLANT
MARKER SKIN DUAL TIP RULER LAB (MISCELLANEOUS) ×1 IMPLANT
NDL HYPO 25X1 1.5 SAFETY (NEEDLE) ×1 IMPLANT
NDL SPNL 18GX3.5 QUINCKE PK (NEEDLE) ×1 IMPLANT
NEEDLE HYPO 25X1 1.5 SAFETY (NEEDLE) ×1 IMPLANT
NEEDLE SPNL 18GX3.5 QUINCKE PK (NEEDLE) ×1 IMPLANT
NS IRRIG 1000ML POUR BTL (IV SOLUTION) ×1 IMPLANT
PACK LAMINECTOMY NEURO (CUSTOM PROCEDURE TRAY) ×1 IMPLANT
PAD ARMBOARD 7.5X6 YLW CONV (MISCELLANEOUS) ×3 IMPLANT
PATTIES SURGICAL .5 X.5 (GAUZE/BANDAGES/DRESSINGS) IMPLANT
PATTIES SURGICAL .5 X1 (DISPOSABLE) IMPLANT
PATTIES SURGICAL 1X1 (DISPOSABLE) IMPLANT
SPIKE FLUID TRANSFER (MISCELLANEOUS) ×1 IMPLANT
SPONGE SURGIFOAM ABS GEL 12-7 (HEMOSTASIS) ×1 IMPLANT
SPONGE T-LAP 4X18 ~~LOC~~+RFID (SPONGE) IMPLANT
STAPLER VISISTAT 35W (STAPLE) IMPLANT
SUT VIC AB 0 CT1 27XBRD ANBCTR (SUTURE) IMPLANT
SUT VIC AB 2-0 CP2 18 (SUTURE) ×1 IMPLANT
SUT VIC AB 3-0 SH 8-18 (SUTURE) ×1 IMPLANT
TOWEL GREEN STERILE (TOWEL DISPOSABLE) IMPLANT
TOWEL GREEN STERILE FF (TOWEL DISPOSABLE) IMPLANT
TRAY FOLEY MTR SLVR 16FR STAT (SET/KITS/TRAYS/PACK) IMPLANT
WATER STERILE IRR 1000ML POUR (IV SOLUTION) ×1 IMPLANT

## 2023-07-30 NOTE — Anesthesia Procedure Notes (Signed)
Procedure Name: Intubation Date/Time: 07/30/2023 11:16 AM  Performed by: Randon Goldsmith, CRNAPre-anesthesia Checklist: Patient identified, Emergency Drugs available, Suction available and Patient being monitored Patient Re-evaluated:Patient Re-evaluated prior to induction Oxygen Delivery Method: Circle system utilized Preoxygenation: Pre-oxygenation with 100% oxygen Induction Type: IV induction Ventilation: Mask ventilation without difficulty Laryngoscope Size: Mac and 3 Grade View: Grade I Tube type: Oral Tube size: 7.0 mm Number of attempts: 1 Airway Equipment and Method: Stylet and Oral airway Placement Confirmation: ETT inserted through vocal cords under direct vision, positive ETCO2 and breath sounds checked- equal and bilateral Secured at: 21 cm Tube secured with: Tape Dental Injury: Teeth and Oropharynx as per pre-operative assessment

## 2023-07-30 NOTE — Transfer of Care (Signed)
Immediate Anesthesia Transfer of Care Note  Patient: [name]  Procedure(s) Performed: MINIMALLY INVASIVE LAMINECTOMY, LATERAL RECESS DECOMPRESSION, LUMBAR FOUR-FIVE (Right)  Patient Location: PACU  Anesthesia Type:General  Level of Consciousness: awake, alert , oriented, and patient cooperative  Airway & Oxygen Therapy: Patient Spontanous Breathing and Patient connected to nasal cannula oxygen  Post-op Assessment: Report given to RN and Post -op Vital signs reviewed and stable  Post vital signs: Reviewed and stable  Last Vitals:  Vitals Value Taken Time  BP 125/66 07/30/23 1245  Temp    Pulse 78 07/30/23 1246  Resp 15 07/30/23 1246  SpO2 100 % 07/30/23 1246  Vitals shown include unfiled device data.  Last Pain:  Vitals:   07/30/23 1002  TempSrc:   PainSc: 0-No pain         Complications: No notable events documented.

## 2023-07-30 NOTE — H&P (Signed)
    Providing Compassionate, Quality Care - Together  NEUROSURGERY HISTORY & PHYSICAL   [name] is an 67 y.o. female.   Chief Complaint: RLE radiculoapthy HPI: This is a 67 year old female with a history of worsening right lower extremity radiculopathy in the L5 distribution.  She had failed multiple conservative measures and has had continued pain radiating down her right leg causing her difficulty standing and walking.  MRI revealed broad-based disc bulging and facet hypertrophy causing compression of the right L5 nerve root at L4-5.  She presents today for surgical intervention.  Past Medical History:  Diagnosis Date   Allergic rhinitis    Allergy    PONV (postoperative nausea and vomiting)     Past Surgical History:  Procedure Laterality Date   BREAST BIOPSY  11-2000   benign   OOPHORECTOMY     VAGINAL HYSTERECTOMY      Family History  Problem Relation Age of Onset   Breast cancer Mother    Hypertension Mother    Stroke Mother    Pancreatitis Father    Social History:  reports that she has never smoked. She has never used smokeless tobacco. She reports current alcohol use. She reports that she does not use drugs.  Allergies:  Allergies  Allergen Reactions   Sulfonamide Derivatives Swelling   Penicillins Rash    Medications Prior to Admission  Medication Sig Dispense Refill   fluticasone (FLONASE) 50 MCG/ACT nasal spray Place 2 sprays into both nostrils daily. (Patient taking differently: Place 2 sprays into both nostrils daily as needed for allergies.) 16 g 6   Hormone Cream Base (HRT BASE) CREA Apply 1 Application topically as directed. Biest (Estriol/Estradiol) 80:20 (apply to both arms once daily in the morning)     Multiple Vitamin (MULTIVITAMIN WITH MINERALS) TABS tablet Take 1 tablet by mouth in the morning.      No results found for this or any previous visit (from the past 48 hours). No results found.  ROS All pertinent positives and negatives are  listed in HPI above  Blood pressure 119/66, pulse 72, temperature 98.1 F (36.7 C), temperature source Oral, resp. rate 18, height 5\' 4"  (1.626 m), weight 81.6 kg, SpO2 99%. Physical Exam  Awake alert oriented x 3, no acute distress PERRLA Speech fluent appropriate Nonlabored breathing Full strength in bilateral upper and lower extremities throughout Decreased sensation to light touch in the L5 distribution on the right  Assessment/Plan 67 year old female with  Right L4-5 lumbar stenosis with right radiculopathy  -OR today for right L4-5 lumbar decompression, laminectomy, we discussed all risks, benefits and expected outcomes as well as alternatives to treatment.  Informed consent was obtained and witnessed.   Thank you for allowing me to participate in this patient's care.  Please do not hesitate to call with questions or concerns.   Monia Pouch, DO Neurosurgeon [facility] [phone]

## 2023-07-30 NOTE — Anesthesia Preprocedure Evaluation (Addendum)
Anesthesia Evaluation  Patient identified by MRN, date of birth, ID band Patient awake    Reviewed: Allergy & Precautions, H&P , NPO status , Patient's Chart, lab work & pertinent test results  History of Anesthesia Complications (+) PONV and history of anesthetic complications  Airway Mallampati: II  TM Distance: >3 FB Neck ROM: Full    Dental no notable dental hx. (+) Teeth Intact, Dental Advisory Given   Pulmonary neg pulmonary ROS   Pulmonary exam normal breath sounds clear to auscultation       Cardiovascular negative cardio ROS  Rhythm:Regular Rate:Normal     Neuro/Psych negative neurological ROS  negative psych ROS   GI/Hepatic negative GI ROS, Neg liver ROS,,,  Endo/Other  negative endocrine ROS    Renal/GU negative Renal ROS  negative genitourinary   Musculoskeletal   Abdominal   Peds  Hematology negative hematology ROS (+)   Anesthesia Other Findings   Reproductive/Obstetrics negative OB ROS                             Anesthesia Physical Anesthesia Plan  ASA: 2  Anesthesia Plan: General   Post-op Pain Management: Tylenol PO (pre-op)* and Toradol IV (intra-op)*   Induction: Intravenous  PONV Risk Score and Plan: 4 or greater and Ondansetron, Dexamethasone, Propofol infusion and Midazolam  Airway Management Planned: Oral ETT  Additional Equipment:   Intra-op Plan:   Post-operative Plan: Extubation in OR  Informed Consent: I have reviewed the patients History and Physical, chart, labs and discussed the procedure including the risks, benefits and alternatives for the proposed anesthesia with the patient or authorized representative who has indicated his/her understanding and acceptance.     Dental advisory given  Plan Discussed with: CRNA  Anesthesia Plan Comments:        Anesthesia Quick Evaluation

## 2023-07-30 NOTE — Op Note (Signed)
Providing Compassionate, Quality Care - Together  Date of service: 07/30/2023  PREOP DIAGNOSIS: Right L4-5 lateral recess stenosis with L5 radiculopathy  POSTOP DIAGNOSIS: Same  PROCEDURE: 1.  Right minimally invasive laminectomy, partial facetectomy and lateral recess decompression for decompression of the L5 nerve root 2. Use of operating microscope 3. Use of intraoperative fluoroscopy  SURGEON: Dr. Kendell Bane C. Donnae Michels, DO  ASSISTANT: Patrici Ranks, PA  ANESTHESIA: General Endotracheal  EBL: Less than 10 cc  SPECIMENS: None  DRAINS: None  COMPLICATIONS: None  CONDITION: Hemodynamically stable  HISTORY: [name] is a 67 y.o. female with progressively worsening right lower extremity L5 radiculopathy resistant to conservative measures.  MRI revealed focal lateral recess stenosis due to facet and ligamentum hypertrophy therefore offered her a right L4-5 laminectomy, lateral recess decompression via minimally invasive approach.  We discussed all risks, benefits and expected outcomes as well as alternatives to treatment.  Informed consent was obtained and witnessed.  PROCEDURE IN DETAIL: After informed consent was obtained and witnessed, the patient was brought to the operating room. After induction of general anesthesia, the patient was positioned on the operative table in the prone position with all pressure points meticulously padded. The skin of the low back was then prepped and draped in the usual sterile fashion. Physician driven timeout was performed.  Under fluoroscopy, the L 4-5 level was identified and marked out on the skin, and after timeout was conducted. Skin incision was then made sharply with a 10 blade and Bovie electrocautery was used to dissect the subcutaneous tissue until the lumbodorsal fascia was identified. The fascia was then incised using Bovie electrocautery and the lamina at the right L4 levels was identified and dissection was carried out in the  subperiosteal plane using the Metrx dilators.  An appropriate sized Metrx tube was placed, and intraoperative x-ray was taken to confirm appropriate placement.  The microscope was sterilely draped and brought into the field and used for the remainder of the case.  Using Bovie electrocautery, soft tissue was cleared from the lamina and medial facet.  Using a high-speed drill, laminotomy was completed with a partial medial facetectomy. The ligamentum flavum was then identified and removed and the lateral edge of the thecal sac was identified. This was then traced down to identify the traversing nerve root.  I followed the traversing nerve root along its entire course and decompressed it with Kerrison rongeur removing the medial facet and superior portion of the L5 lamina.  I then followed the entire course of the L5 nerve root as well as its branch outside the L5-S1 foramen and it appeared appropriately decompressed and pulsatile.    Hemostasis was then secured using a combination of morcellized Gelfoam and thrombin and bipolar electrocautery. The wound is irrigated with copious amounts of antibiotic saline irrigation.  The traversing and exiting nerve roots were felt with a Murphy ball probe and noted to be decompressed.  The wound was noted to be excellently hemostatic.  The nerve root was then covered with a long-acting steroid solution.  The Metrx tube was then removed, hemostasis was achieved with bipolar cautery and the wound is closed in layers using 2-0 Vicryl stitches. The skin was closed using standard skin glue.  Sterile dressing was applied.  Drapes were taken down.  At the end of the case all sponge, needle, and instrument counts were correct. The patient was then fully transferred to the stretcher, extubated and taken to the postanesthesia care unit in stable hemodynamic condition.

## 2023-07-30 NOTE — Evaluation (Signed)
Occupational Therapy Evaluation and DC Summary  Patient Details Name: [name] MRN: [id number] DOB: [birth year] Today's Date: 07/30/2023   History of Present Illness 67 yo admitted 07/30/23 for Rt L5 laminectomy, partial facetectomy and lateral decompression of nerve root. PMH PONV   Clinical Impression   Pt admitted for above, she reports much improved back pain and feeling better about OOB mobility. Pt ambulating supervision no AD and demonstrated good use of learned compensatory strategies to complete functional ADLs, discussed strategies to use at sink and to don bras. Pt needed 1 VC to limit twisting during session, her husband made good sure to identify the mistake. Pt currently has no acute skilled OT needs, no post acute OT needed.        If plan is discharge home, recommend the following: Assistance with cooking/housework;Assist for transportation    Functional Status Assessment  Patient has not had a recent decline in their functional status  Equipment Recommendations  None recommended by OT    Recommendations for Other Services       Precautions / Restrictions Precautions Precautions: Back Precaution Booklet Issued: Yes (comment) Precaution Comments: pt able to state and adhered with min cues Required Braces or Orthoses:  (none) Restrictions Weight Bearing Restrictions Per Provider Order: No      Mobility Bed Mobility   Bed Mobility: Rolling, Sidelying to Sit, Sit to Supine Rolling: Supervision, Used rails Sidelying to sit: Supervision, Used rails       General bed mobility comments: Pt left seated EOB with husband at bedside    Transfers Overall transfer level: Modified independent Equipment used: None               General transfer comment: pt moving slowly, cautiously      Balance Overall balance assessment: No apparent balance deficits (not formally assessed)                                         ADL either  performed or assessed with clinical judgement   ADL Overall ADL's : Modified independent;At baseline                                       General ADL Comments: Pt re-educated on POB precautions, discussed compensatory strategies to use at sink with oral care and for wiping from the front to maintain back precautions. Also reviewed car transfer with pt and alt options and ways to don her bra while maintaining precautions. Pt navigated 3 steps using R rail very good (reinforeced up with good LLE and down with back RLE). Pt using figure four technique sitting EOB to don pants, underwear, and socks. 1 verbal cue to bring BLE closer to body to reduce the amount of reach which could lead to bending.     Vision         Perception         Praxis         Pertinent Vitals/Pain Pain Assessment Pain Assessment: No/denies pain Pain Location: pt reports no pain     Extremity/Trunk Assessment Upper Extremity Assessment Upper Extremity Assessment: Overall WFL for tasks assessed   Lower Extremity Assessment Lower Extremity Assessment: Overall WFL for tasks assessed   Cervical / Trunk Assessment Cervical / Trunk Assessment: Back Surgery   Communication Communication  Communication: No apparent difficulties Cueing Techniques: Verbal cues   Cognition Arousal: Alert Behavior During Therapy: WFL for tasks assessed/performed Overall Cognitive Status: Within Functional Limits for tasks assessed                                       General Comments  Husband present and recalls precautions as well    Exercises     Shoulder Instructions      Home Living Family/patient expects to be discharged to:: Private residence Living Arrangements: Spouse/significant other Available Help at Discharge: Family;Available 24 hours/day Type of Home: House Home Access: Stairs to enter [employer] of Steps: 3 Entrance Stairs-Rails: Right Home Layout: Two  level;Able to live on main level with bedroom/bathroom     Bathroom Shower/Tub: Producer, television/film/video: Standard     Home Equipment: Shower seat - built in          Prior Functioning/Environment Prior Level of Function : Independent/Modified Independent;Driving;Working/employed             Mobility Comments: ind ADLs Comments: ind        OT Problem List: Pain;Impaired balance (sitting and/or standing);Decreased knowledge of precautions      OT Treatment/Interventions:      OT Goals(Current goals can be found in the care plan section) Acute Rehab OT Goals Patient Stated Goal: To go home OT Goal Formulation: With patient/family Time For Goal Achievement: 08/13/23 Potential to Achieve Goals: Good  OT Frequency:      Co-evaluation              AM-PAC OT "6 Clicks" Daily Activity     Outcome Measure Help from another person eating meals?: None Help from another person taking care of personal grooming?: None Help from another person toileting, which includes using toliet, bedpan, or urinal?: None Help from another person bathing (including washing, rinsing, drying)?: None Help from another person to put on and taking off regular upper body clothing?: None Help from another person to put on and taking off regular lower body clothing?: None 6 Click Score: 24   End of Session    Activity Tolerance: Patient tolerated treatment well Patient left: in bed;with call bell/phone within reach;with family/visitor present;Other (comment) (sitting EOB)  OT Visit Diagnosis: Unsteadiness on feet (R26.81);Pain Pain - Right/Left:  (back)                Time: 3016-0109 OT Time Calculation (min): 20 min Charges:  OT General Charges $OT Visit: 1 Visit OT Evaluation $OT Eval Low Complexity: 1 Low  07/30/2023  AB, OTR/L  Acute Rehabilitation Services  Office: [phone]   Tristan Schroeder 07/30/2023, 4:27 PM

## 2023-07-30 NOTE — Evaluation (Signed)
Physical Therapy Brief Evaluation and Discharge Note Patient Details Name: [name] MRN: [id number] DOB: 03/06/1956 Today's Date: 07/30/2023   History of Present Illness  67 yo admitted 07/30/23 for Rt L5 laminectomy, partial facetectomy and lateral decompression of nerve root. PMH PONV  Clinical Impression  Patient evaluated by Physical Therapy with no further acute PT needs identified. All education has been completed and the patient has no further questions. PT is signing off. Thank you for this referral.        PT Assessment    Assistance Needed at Discharge       Equipment Recommendations None recommended by PT  Recommendations for Other Services       Precautions/Restrictions Precautions Precautions: Back (per pt, MD told her to follow) Precaution Booklet Issued: Yes (comment) Precaution Comments: pt able to state and adhered with min cues Required Braces or Orthoses:  (none)        Mobility  Bed Mobility Rolling: Min assist        Transfers Overall transfer level: Modified independent Equipment used: None               General transfer comment: pt moving slowly, cautiously    Ambulation/Gait Ambulation/Gait assistance: Contact guard assist, Supervision Gait Distance (Feet): 200 Feet Assistive device: None Gait Pattern/deviations: Step-through pattern, Decreased stride length   General Gait Details: initially cautious; no imbalance  Home Activity Instructions    Stairs Stairs:  (pt able to verbalize correct sequencing (she's been using this technique PTA))          Modified Rankin (Stroke Patients Only)        Balance                          Pertinent Vitals/Pain   Pain Assessment Pain Assessment: Faces Faces Pain Scale: Hurts a little bit Pain Location: low back Pain Descriptors / Indicators: Operative site guarding Pain Intervention(s): Limited activity within patient's tolerance, Monitored during session      Home Living Family/patient expects to be discharged to:: Private residence Living Arrangements: Spouse/significant other Available Help at Discharge: Family;Available 24 hours/day Home Environment: Stairs to enter;Rail - right   Home Equipment: Shower seat - built in        Prior Function Level of Independence: Independent      UE/LE Assessment               Communication   Communication Communication: No apparent difficulties Cueing Techniques: Verbal cues     Cognition         General Comments General comments (skin integrity, edema, etc.): Husband present.    Exercises     Assessment/Plan    PT Problem List         PT Visit Diagnosis Other abnormalities of gait and mobility (R26.89)    No Skilled PT All education completed;Patient will have necessary level of assist by caregiver at discharge   Co-evaluation                AMPAC 6 Clicks Help needed turning from your back to your side while in a flat bed without using bedrails?: A Little Help needed moving from lying on your back to sitting on the side of a flat bed without using bedrails?: A Little Help needed moving to and from a bed to a chair (including a wheelchair)?: A Little Help needed standing up from a chair using your arms (e.g., wheelchair or bedside  chair)?: A Little Help needed to walk in hospital room?: A Little Help needed climbing 3-5 steps with a railing? : A Little 6 Click Score: 18      End of Session Equipment Utilized During Treatment: Gait belt Activity Tolerance: Patient tolerated treatment well Patient left: in bed;with call bell/phone within reach;with family/visitor present Nurse Communication: Mobility status;Precautions PT Visit Diagnosis: Other abnormalities of gait and mobility (R26.89)     Time: 1610-9604 PT Time Calculation (min) (ACUTE ONLY): 21 min  Charges:   PT Evaluation $PT Eval Low Complexity: 1 Low       [name], PT Acute  Rehabilitation Services  Office [phone]   [name]  07/30/2023, 3:28 PM

## 2023-07-30 NOTE — Discharge Summary (Signed)
  Patient ID: [name] MRN: [id number] DOB/AGE: 66-Jan-1957 66 y.o.  Admit date: 07/30/2023 Discharge date: 07/30/2023  Admission Diagnoses: Lumbar spinal stenosis [M48.061]   Discharge Diagnoses: Same   Discharged Condition: Stable  Hospital Course:  [name] is a 67 y.o. female who was admitted following an uncomplicated L4-5 laminectomy. They were recovered in PACU and transferred to [facility]. Patient was appropriate for same day discharge from the floor after ambulation with staff. Pt to f/u in office for routine post op visit. Pt is in agreement w/ plan.    Discharge Exam: Blood pressure 125/66, pulse 86, temperature 98.1 F (36.7 C), temperature source Oral, resp. rate 12, height 5\' 4"  (1.626 m), weight 81.6 kg, SpO2 99%. A&O x3 Speech fluent, appropriate Strength 5/5 x4.  SILTx4.  Dressing c/d/I.   Disposition: Discharge disposition: 01-Home or Self Care       Discharge Instructions     Incentive spirometry RT   Complete by: As directed       Allergies as of 07/30/2023       Reactions   Sulfonamide Derivatives Swelling   Penicillins Rash        Medication List     TAKE these medications    docusate sodium 100 MG capsule Commonly known as: Colace Take 1 capsule (100 mg total) by mouth 2 (two) times daily.   fluticasone 50 MCG/ACT nasal spray Commonly known as: FLONASE Place 2 sprays into both nostrils daily. What changed:  when to take this reasons to take this   HRT Base Crea Apply 1 Application topically as directed. Biest (Estriol/Estradiol) 80:20 (apply to both arms once daily in the morning)   HYDROcodone-acetaminophen 5-325 MG tablet Commonly known as: NORCO/VICODIN Take 1 tablet by mouth every 4 (four) hours as needed for moderate pain (pain score 4-6).   methocarbamol 750 MG tablet Commonly known as: Robaxin-750 Take 1 tablet (750 mg total) by mouth 4 (four) times daily.   multivitamin with minerals Tabs tablet Take 1  tablet by mouth in the morning.         Signed: Clovis Riley 07/30/2023, 12:47 PM

## 2023-07-30 NOTE — Anesthesia Postprocedure Evaluation (Signed)
Anesthesia Post Note  Patient: ANDRINA BRECKNER  Procedure(s) Performed: MINIMALLY INVASIVE LAMINECTOMY, LATERAL RECESS DECOMPRESSION, LUMBAR FOUR-FIVE (Right)     Patient location during evaluation: PACU Anesthesia Type: General Level of consciousness: awake and alert Pain management: pain level controlled Vital Signs Assessment: post-procedure vital signs reviewed and stable Respiratory status: spontaneous breathing, nonlabored ventilation and respiratory function stable Cardiovascular status: blood pressure returned to baseline and stable Postop Assessment: no apparent nausea or vomiting Anesthetic complications: no  No notable events documented.  Last Vitals:  Vitals:   07/30/23 1415 07/30/23 1430  BP: (!) 115/42 111/61  Pulse: (!) 58 65  Resp: 12 18  Temp: 36.6 C 36.6 C  SpO2: 96% 98%    Last Pain:  Vitals:   07/30/23 1446  TempSrc:   PainSc: 0-No pain                 Esco Joslyn,W. EDMOND

## 2023-07-30 NOTE — Progress Notes (Signed)
Patient alert and oriented, void, ambulate, surgical site clean and dry. Patient awaiting transportation.

## 2023-07-31 ENCOUNTER — Encounter (HOSPITAL_COMMUNITY): Payer: Self-pay | Admitting: Neurological Surgery

## 2023-08-28 ENCOUNTER — Telehealth (INDEPENDENT_AMBULATORY_CARE_PROVIDER_SITE_OTHER): Payer: Medicare Other | Admitting: Family Medicine

## 2023-08-28 DIAGNOSIS — U071 COVID-19: Secondary | ICD-10-CM

## 2023-08-28 MED ORDER — NIRMATRELVIR/RITONAVIR (PAXLOVID)TABLET: 3.0000 | ORAL_TABLET | Freq: Two times a day (BID) | ORAL | 0 refills | Status: AC

## 2023-08-28 NOTE — Progress Notes (Signed)
MyChart Video Visit    Virtual Visit via Video Note   This patient is at least at moderate risk for complications without adequate follow up. This format is felt to be most appropriate for this patient at this time. Physical exam was limited by quality of the video and audio technology used for the visit. Herbert Seta was able to get the patient set up on a video visit.  Patient location: home  Patient and provider in visit Provider location: Office  I discussed the limitations of evaluation and management by telemedicine and the availability of in person appointments. The patient expressed understanding and agreed to proceed.  Visit Date: 08/28/2023  Today's healthcare provider: Donato Schultz, DO     Subjective:    Patient ID: [name], female    DOB: 12/11/1955, 68 y.o.   MRN: [id number]  Chief Complaint  Patient presents with   Covid Positive    HPI Patient is in today for + covid Discussed the use of AI scribe software for clinical note transcription with the patient, who gave verbal consent to proceed.  History of Present Illness   The patient, who has been in close contact with a confirmed COVID-19 case, began experiencing symptoms two days ago. The initial symptom was severe chills, which persisted throughout the following day and night. As of the current day, the chills have subsided, but the patient reports significant muscle aches.  In addition to the muscle aches, the patient is experiencing congestion and headaches. She has been managing these symptoms with over-the-counter Tylenol and Flonase, which she had at home. Despite the severe chills, the patient reports that she has not had a fever.  The patient also underwent a test for COVID-19, flu A, and flu B, which returned a positive result for COVID-19 only. The patient's sleep has been disrupted due to the chills and discomfort, but she denies having a cough. The patient has been managing her symptoms at  home and has not required any additional medications or interventions at this time.       Past Medical History:  Diagnosis Date   Allergic rhinitis    Allergy    PONV (postoperative nausea and vomiting)     Past Surgical History:  Procedure Laterality Date   BREAST BIOPSY  11-2000   benign   LUMBAR LAMINECTOMY/ DECOMPRESSION WITH MET-RX Right 07/30/2023   Procedure: MINIMALLY INVASIVE LAMINECTOMY, LATERAL RECESS DECOMPRESSION, LUMBAR FOUR-FIVE;  Surgeon: Dawley, Alan Mulder, DO;  Location: [facility] OR;  Service: Neurosurgery;  Laterality: Right;  3C   OOPHORECTOMY     VAGINAL HYSTERECTOMY      Family History  Problem Relation Age of Onset   Breast cancer Mother    Hypertension Mother    Stroke Mother    Pancreatitis Father     Social History   Socioeconomic History   Marital status: Married    Spouse name: Not on file   Number of children: 1   Years of education: Not on file   Highest education level: Associate degree: occupational, Scientist, product/process development, or vocational program  Occupational History   Occupation: International aid/development worker  Tobacco Use   Smoking status: Never   Smokeless tobacco: Never  Vaping Use   Vaping status: Never Used  Substance and Sexual Activity   Alcohol use: Yes    Comment: rare   Drug use: No   Sexual activity: Yes    Partners: Male  Other Topics Concern   Not on file  Social History Narrative   Exercise- walking a little at work   Social Drivers of Corporate investment banker Strain: Low Risk  (08/28/2023)   Overall Financial Resource Strain (CARDIA)    Difficulty of Paying Living Expenses: Not hard at all  Food Insecurity: No Food Insecurity (08/28/2023)   Hunger Vital Sign    Worried About Running Out of Food in the Last Year: Never true    Ran Out of Food in the Last Year: Never true  Transportation Needs: No Transportation Needs (08/28/2023)   PRAPARE - Administrator, Civil Service (Medical): No    Lack of Transportation (Non-Medical): No   Physical Activity: Inactive (08/28/2023)   Exercise Vital Sign    Days of Exercise per Week: 0 days    Minutes of Exercise per Session: 30 min  Stress: No Stress Concern Present (08/28/2023)   [employer] of Occupational Health - Occupational Stress Questionnaire    Feeling of Stress : Not at all  Social Connections: Socially Integrated (08/28/2023)   Social Connection and Isolation Panel [NHANES]    Frequency of Communication with Friends and Family: More than three times a week    Frequency of Social Gatherings with Friends and Family: More than three times a week    Attends Religious Services: More than 4 times per year    Active Member of [employer] or Organizations: Yes    Attends Engineer, structural: More than 4 times per year    Marital Status: Married  Catering manager Violence: Not At Risk (09/29/2022)   Humiliation, Afraid, Rape, and Kick questionnaire    Fear of Current or Ex-Partner: No    Emotionally Abused: No    Physically Abused: No    Sexually Abused: No    Outpatient Medications Prior to Visit  Medication Sig Dispense Refill   fluticasone (FLONASE) 50 MCG/ACT nasal spray Place 2 sprays into both nostrils daily. (Patient taking differently: Place 2 sprays into both nostrils daily as needed for allergies.) 16 g 6   Hormone Cream Base (HRT BASE) CREA Apply 1 Application topically as directed. Biest (Estriol/Estradiol) 80:20 (apply to both arms once daily in the morning)     methocarbamol (ROBAXIN-750) 750 MG tablet Take 1 tablet (750 mg total) by mouth 4 (four) times daily. 120 tablet 0   Multiple Vitamin (MULTIVITAMIN WITH MINERALS) TABS tablet Take 1 tablet by mouth in the morning.     docusate sodium (COLACE) 100 MG capsule Take 1 capsule (100 mg total) by mouth 2 (two) times daily. 60 capsule 0   HYDROcodone-acetaminophen (NORCO/VICODIN) 5-325 MG tablet Take 1 tablet by mouth every 4 (four) hours as needed for moderate pain (pain score 4-6). 20 tablet 0    No facility-administered medications prior to visit.    Allergies  Allergen Reactions   Sulfonamide Derivatives Swelling   Penicillins Rash    Review of Systems  Constitutional:  Negative for fever and malaise/fatigue.  HENT:  Negative for congestion.   Eyes:  Negative for blurred vision.  Respiratory:  Negative for cough and shortness of breath.   Cardiovascular:  Negative for chest pain, palpitations and leg swelling.  Gastrointestinal:  Negative for abdominal pain, blood in stool, nausea and vomiting.  Genitourinary:  Negative for dysuria and frequency.  Musculoskeletal:  Negative for back pain and falls.  Skin:  Negative for rash.  Neurological:  Negative for dizziness, loss of consciousness and headaches.  Endo/Heme/Allergies:  Negative for environmental allergies.  Psychiatric/Behavioral:  Negative for depression. The patient is not nervous/anxious.        Objective:    Physical Exam Vitals and nursing note reviewed.  Constitutional:      General: She is not in acute distress.    Appearance: Normal appearance. She is well-developed.  HENT:     Head: Normocephalic and atraumatic.  Eyes:     General: No scleral icterus.       Right eye: No discharge.        Left eye: No discharge.  Pulmonary:     Effort: Pulmonary effort is normal.  Neurological:     Mental Status: She is alert and oriented to person, place, and time.  Psychiatric:        Mood and Affect: Mood normal.        Behavior: Behavior normal.        Thought Content: Thought content normal.        Judgment: Judgment normal.     There were no vitals taken for this visit. Wt Readings from Last 3 Encounters:  07/30/23 180 lb (81.6 kg)  07/24/23 181 lb 6.4 oz (82.3 kg)  05/28/23 180 lb (81.6 kg)       Assessment & Plan:  COVID-19 -     nirmatrelvir/ritonavir; Take 3 tablets by mouth 2 (two) times daily for 5 days. (Take nirmatrelvir 150 mg two tablets twice daily for 5 days and ritonavir 100 mg  one tablet twice daily for 5 days) Patient GFR is 83  Dispense: 30 tablet; Refill: 0   Assessment and Plan    COVID-19 Infection Confirmed COVID-19 infection with symptoms starting Wednesday night, including severe chills, myalgia, congestion, headache, and body aches. Tested positive on a combined COVID, flu A, and flu B test strip. No fever detected. Symptoms have slightly improved; chills have subsided but myalgia persists. Discussed Paxlovid to shorten symptom duration. Prescribe Paxlovid. Use Flonase for congestion. Alternate acetaminophen and ibuprofen every three hours if fever exceeds 102F. Take lukewarm baths if fever persists. Quarantine for five days from symptom onset. Wear a mask for five additional days after quarantine if symptoms are improving.  General Health Maintenance Monitor symptoms and seek medical attention if she worsens. Adhere to quarantine guidelines. Wear a mask for five additional days after quarantine if symptoms are improving.        I discussed the assessment and treatment plan with the patient. The patient was provided an opportunity to ask questions and all were answered. The patient agreed with the plan and demonstrated an understanding of the instructions.   The patient was advised to call back or seek an in-person evaluation if the symptoms worsen or if the condition fails to improve as anticipated.  Donato Schultz, DO [facility] [phone] (phone) [phone] (fax)  [facility]

## 2023-09-03 ENCOUNTER — Telehealth: Payer: Self-pay | Admitting: Family Medicine

## 2023-09-03 NOTE — Telephone Encounter (Signed)
Copied from CRM [id number]. Topic: Medicare AWV >> Sep 03, 2023 11:24 AM Payton Doughty wrote: Reason for CRM: Called LVM 09/03/2023 to schedule AWV. Please schedule office or virtual visits  Verlee Rossetti; Care Guide Ambulatory Clinical Support [facility] Direct Dial: [phone]

## 2023-10-01 ENCOUNTER — Ambulatory Visit: Payer: Medicare Other

## 2023-10-01 VITALS — Ht 64.0 in | Wt 180.0 lb

## 2023-10-01 DIAGNOSIS — Z0000 Encounter for general adult medical examination without abnormal findings: Secondary | ICD-10-CM | POA: Diagnosis not present

## 2023-10-01 NOTE — Patient Instructions (Addendum)
 Ms. Riling , Thank you for taking time to come for your Medicare Wellness Visit. I appreciate your ongoing commitment to your health goals. Please review the following plan we discussed and let me know if I can assist you in the future.   Referrals/Orders/Follow-Ups/Clinician Recommendations:   This is a list of the screening recommended for you and due dates:  Health Maintenance  Topic Date Due   COVID-19 Vaccine (5 - 2024-25 season) 04/12/2023   Mammogram  04/22/2024   Medicare Annual Wellness Visit  09/30/2024   DTaP/Tdap/Td vaccine (3 - Td or Tdap) 04/03/2027   Colon Cancer Screening  12/17/2027   Pneumonia Vaccine  Completed   Flu Shot  Completed   DEXA scan (bone density measurement)  Completed   Hepatitis C Screening  Completed   Zoster (Shingles) Vaccine  Completed   HPV Vaccine  Aged Out    Advanced directives: (Copy Requested) Please bring a copy of your health care power of attorney and living will to the office to be added to your chart at your convenience.  Next Medicare Annual Wellness Visit scheduled for next year: Yes

## 2023-10-01 NOTE — Progress Notes (Signed)
 Subjective:   [name] is a 68 y.o. female who presents for Medicare Annual (Subsequent) preventive examination.  Visit Complete: Virtual I connected with  [name] on 10/01/23 by a audio enabled telemedicine application and verified that I am speaking with the correct person using two identifiers.  Patient Location: Home  Provider Location: Home Office  I discussed the limitations of evaluation and management by telemedicine. The patient expressed understanding and agreed to proceed.  Vital Signs: Because this visit was a virtual/telehealth visit, some criteria may be missing or patient reported. Any vitals not documented were not able to be obtained and vitals that have been documented are patient reported.    Cardiac Risk Factors include: advanced age (>30men, >4 women)     Objective:    Today's Vitals   10/01/23 1014  Weight: 180 lb (81.6 kg)  Height: 5\' 4"  (1.626 m)   Body mass index is 30.9 kg/m.     10/01/2023   10:20 AM 07/24/2023   10:09 AM 11/29/2019   11:06 AM 05/31/2019   11:44 AM 05/08/2016    8:49 AM  Advanced Directives  Does Patient Have a Medical Advance Directive? Yes Yes No No No  Type of Estate agent of [place];Living will      Does patient want to make changes to medical advance directive?  No - Patient declined     Copy of Healthcare Power of Attorney in Chart? No - copy requested      Would patient like information on creating a medical advance directive?   No - Patient declined No - Patient declined No - patient declined information    Current Medications (verified) Outpatient Encounter Medications as of 10/01/2023  Medication Sig   fluticasone (FLONASE) 50 MCG/ACT nasal spray Place 2 sprays into both nostrils daily. (Patient taking differently: Place 2 sprays into both nostrils daily as needed for allergies.)   Hormone Cream Base (HRT BASE) CREA Apply 1 Application topically as directed. Biest  (Estriol/Estradiol) 80:20 (apply to both arms once daily in the morning)   methocarbamol (ROBAXIN-750) 750 MG tablet Take 1 tablet (750 mg total) by mouth 4 (four) times daily.   Multiple Vitamin (MULTIVITAMIN WITH MINERALS) TABS tablet Take 1 tablet by mouth in the morning.   [DISCONTINUED] nitroGLYCERIN (NITRO-DUR) 0.2 mg/hr patch Apply 1/4 of a patch to skin once daily.   No facility-administered encounter medications on file as of 10/01/2023.    Allergies (verified) Sulfonamide derivatives and Penicillins   History: Past Medical History:  Diagnosis Date   Allergic rhinitis    Allergy    PONV (postoperative nausea and vomiting)    Past Surgical History:  Procedure Laterality Date   BREAST BIOPSY  11-2000   benign   LUMBAR LAMINECTOMY/ DECOMPRESSION WITH MET-RX Right 07/30/2023   Procedure: MINIMALLY INVASIVE LAMINECTOMY, LATERAL RECESS DECOMPRESSION, LUMBAR FOUR-FIVE;  Surgeon: Dawley, Alan Mulder, DO;  Location: [facility] OR;  Service: Neurosurgery;  Laterality: Right;  3C   OOPHORECTOMY     VAGINAL HYSTERECTOMY     Family History  Problem Relation Age of Onset   Breast cancer Mother    Hypertension Mother    Stroke Mother    Pancreatitis Father    Social History   Socioeconomic History   Marital status: Married    Spouse name: Not on file   Number of children: 1   Years of education: Not on file   Highest education level: Associate degree: occupational, Scientist, product/process development, or vocational program  Occupational History   Occupation: International aid/development worker  Tobacco Use   Smoking status: Never   Smokeless tobacco: Never  Vaping Use   Vaping status: Never Used  Substance and Sexual Activity   Alcohol use: Yes    Comment: rare   Drug use: No   Sexual activity: Yes    Partners: Male  Other Topics Concern   Not on file  Social History Narrative   Exercise- walking a little at work   Social Drivers of Corporate investment banker Strain: Low Risk  (10/01/2023)   Overall Financial  Resource Strain (CARDIA)    Difficulty of Paying Living Expenses: Not hard at all  Food Insecurity: No Food Insecurity (10/01/2023)   Hunger Vital Sign    Worried About Running Out of Food in the Last Year: Never true    Ran Out of Food in the Last Year: Never true  Transportation Needs: No Transportation Needs (10/01/2023)   PRAPARE - Administrator, Civil Service (Medical): No    Lack of Transportation (Non-Medical): No  Physical Activity: Insufficiently Active (10/01/2023)   Exercise Vital Sign    Days of Exercise per Week: 3 days    Minutes of Exercise per Session: 30 min  Stress: No Stress Concern Present (10/01/2023)   [employer] of Occupational Health - Occupational Stress Questionnaire    Feeling of Stress : Not at all  Social Connections: Socially Integrated (10/01/2023)   Social Connection and Isolation Panel [NHANES]    Frequency of Communication with Friends and Family: More than three times a week    Frequency of Social Gatherings with Friends and Family: More than three times a week    Attends Religious Services: More than 4 times per year    Active Member of [employer] or Organizations: Yes    Attends Engineer, structural: More than 4 times per year    Marital Status: Married    Tobacco Counseling Counseling given: Not Answered   Clinical Intake:  Pre-visit preparation completed: Yes  Pain : No/denies pain     BMI - recorded: 30.9 Nutritional Status: BMI > 30  Obese Nutritional Risks: None Diabetes: No  How often do you need to have someone help you when you read instructions, pamphlets, or other written materials from your doctor or pharmacy?: 1 - Never  Interpreter Needed?: No  Information entered by :: Theresa Mulligan LPN   Activities of Daily Living    10/01/2023   10:19 AM 07/24/2023   10:12 AM  In your present state of health, do you have any difficulty performing the following activities:  Hearing? 0   Vision? 0    Difficulty concentrating or making decisions? 0   Walking or climbing stairs? 0   Dressing or bathing? 0   Doing errands, shopping? 0 0  Preparing Food and eating ? N   Using the Toilet? N   In the past six months, have you accidently leaked urine? N   Do you have problems with loss of bowel control? N   Managing your Medications? N   Managing your Finances? N   Housekeeping or managing your Housekeeping? N     Patient Care Team: Zola Button, Grayling Congress, DO as PCP - General Marcelle Overlie, MD as Consulting Physician (Obstetrics and Gynecology) [place], Amy, MD as Consulting Physician (Dermatology) Judi Saa, DO as Consulting Physician (Family Medicine)  Indicate any recent Medical Services you may have received from other than [facility] providers in  the past year (date may be approximate).     Assessment:   This is a routine wellness examination for Cybele.  Hearing/Vision screen Hearing Screening - Comments:: Denies hearing difficulties   Vision Screening - Comments:: Wears rx glasses - up to date with routine eye exams with  [facility]   Goals Addressed               This Visit's Progress     Increase physical activity (pt-stated)        Stay Active.       Depression Screen    10/01/2023   10:18 AM 01/06/2023    4:34 PM 09/29/2022    1:44 PM 09/01/2022    2:31 PM 08/29/2021    9:55 AM 07/10/2020    2:41 PM 05/06/2018    1:39 PM  PHQ 2/9 Scores  PHQ - 2 Score 0 0 0 0 0 0 0  PHQ- 9 Score   0    0    Fall Risk    10/01/2023   10:19 AM 01/06/2023    4:34 PM 09/01/2022    2:31 PM  Fall Risk   Falls in the past year? 0 0 0  Number falls in past yr: 0 0 0  Injury with Fall? 0 0 0  Risk for fall due to : No Fall Risks No Fall Risks   Follow up Falls prevention discussed;Falls evaluation completed  Falls evaluation completed    MEDICARE RISK AT HOME: Medicare Risk at Home Any stairs in or around the home?: Yes If so, are there any without handrails?:  No Home free of loose throw rugs in walkways, pet beds, electrical cords, etc?: Yes Adequate lighting in your home to reduce risk of falls?: Yes Life alert?: No Use of a cane, walker or w/c?: No Grab bars in the bathroom?: Yes Shower chair or bench in shower?: Yes Elevated toilet seat or a handicapped toilet?: Yes  TIMED UP AND GO:  Was the test performed?  No    Cognitive Function:        10/01/2023   10:20 AM  6CIT Screen  What Year? 0 points  What month? 0 points  What time? 0 points  Count back from 20 0 points  Months in reverse 0 points  Repeat phrase 0 points  Total Score 0 points    Immunizations Immunization History  Administered Date(s) Administered   Influenza, High Dose Seasonal PF 06/20/2022, 05/26/2023   Influenza,inj,Quad PF,6+ Mos 05/06/2018, 05/09/2019   Influenza-Unspecified 06/19/2020, 04/11/2021, 06/20/2022   Moderna Covid-19 Vaccine Bivalent Booster 41yrs & up 03/02/2021   Moderna Sars-Covid-2 Vaccination 11/02/2019, 11/28/2019, 07/13/2020   PNEUMOCOCCAL CONJUGATE-20 09/29/2022   Td 07/26/2004   Tdap 04/02/2017   Zoster Recombinant(Shingrix) 05/06/2018, 07/21/2018   Zoster, Live 06/22/2014, 05/06/2018, 07/21/2018    TDAP status: Up to date  Flu Vaccine status: Up to date  Pneumococcal vaccine status: Up to date  Covid-19 vaccine status: Declined, Education has been provided regarding the importance of this vaccine but patient still declined. Advised may receive this vaccine at local pharmacy or Health Dept.or vaccine clinic. Aware to provide a copy of the vaccination record if obtained from local pharmacy or Health Dept. Verbalized acceptance and understanding.  Qualifies for Shingles Vaccine? Yes   Zostavax completed Yes   Shingrix Completed?: Yes  Screening Tests Health Maintenance  Topic Date Due   COVID-19 Vaccine (5 - 2024-25 season) 04/12/2023   MAMMOGRAM  04/22/2024   Medicare Annual  Wellness (AWV)  09/30/2024   DTaP/Tdap/Td (3  - Td or Tdap) 04/03/2027   Colonoscopy  12/17/2027   Pneumonia Vaccine 24+ Years old  Completed   INFLUENZA VACCINE  Completed   DEXA SCAN  Completed   Hepatitis C Screening  Completed   Zoster Vaccines- Shingrix  Completed   HPV VACCINES  Aged Out    Health Maintenance  Health Maintenance Due  Topic Date Due   COVID-19 Vaccine (5 - 2024-25 season) 04/12/2023    Colorectal cancer screening: Type of screening: Colonoscopy. Completed 12/17/22. Repeat every 5 years  Mammogram status: Completed 04/23/23. Repeat every year  Bone Density status: Completed 01/01/18. Results reflect: Bone density results: OSTEOPENIA. Repeat every   years.    Additional Screening:  Hepatitis C Screening: does qualify; Completed 05/31/19  Vision Screening: Recommended annual ophthalmology exams for early detection of glaucoma and other disorders of the eye. Is the patient up to date with their annual eye exam?  Yes  Who is the provider or what is the name of the office in which the patient attends annual eye exams? [facility] If pt is not established with a provider, would they like to be referred to a provider to establish care? No .   Dental Screening: Recommended annual dental exams for proper oral hygiene    Community Resource Referral / Chronic Care Management:  CRR required this visit?  No   CCM required this visit?  No     Plan:     I have personally reviewed and noted the following in the patient's chart:   Medical and social history Use of alcohol, tobacco or illicit drugs  Current medications and supplements including opioid prescriptions. Patient is not currently taking opioid prescriptions. Functional ability and status Nutritional status Physical activity Advanced directives List of other physicians Hospitalizations, surgeries, and ER visits in previous 12 months Vitals Screenings to include cognitive, depression, and falls Referrals and appointments  In addition, I  have reviewed and discussed with patient certain preventive protocols, quality metrics, and best practice recommendations. A written personalized care plan for preventive services as well as general preventive health recommendations were provided to patient.     Tillie Rung, LPN   1/61/0960   After Visit Summary: (MyChart) Due to this being a telephonic visit, the after visit summary with patients personalized plan was offered to patient via MyChart   Nurse Notes: None

## 2023-10-15 ENCOUNTER — Encounter: Payer: Self-pay | Admitting: Family Medicine

## 2023-10-15 ENCOUNTER — Ambulatory Visit (INDEPENDENT_AMBULATORY_CARE_PROVIDER_SITE_OTHER): Payer: Medicare Other | Admitting: Family Medicine

## 2023-10-15 VITALS — BP 120/70 | HR 78 | Temp 97.8°F | Resp 18 | Ht 64.0 in | Wt 183.8 lb

## 2023-10-15 DIAGNOSIS — Z136 Encounter for screening for cardiovascular disorders: Secondary | ICD-10-CM | POA: Diagnosis not present

## 2023-10-15 DIAGNOSIS — Z0000 Encounter for general adult medical examination without abnormal findings: Secondary | ICD-10-CM | POA: Diagnosis not present

## 2023-10-15 DIAGNOSIS — D729 Disorder of white blood cells, unspecified: Secondary | ICD-10-CM | POA: Diagnosis not present

## 2023-10-15 DIAGNOSIS — H5213 Myopia, bilateral: Secondary | ICD-10-CM | POA: Diagnosis not present

## 2023-10-15 LAB — CBC WITH DIFFERENTIAL/PLATELET
Basophils Absolute: 0 10*3/uL (ref 0.0–0.1)
Basophils Relative: 0.6 % (ref 0.0–3.0)
Eosinophils Absolute: 0.1 10*3/uL (ref 0.0–0.7)
Eosinophils Relative: 2.4 % (ref 0.0–5.0)
HCT: 41.5 % (ref 36.0–46.0)
Hemoglobin: 13.8 g/dL (ref 12.0–15.0)
Lymphocytes Relative: 28.2 % (ref 12.0–46.0)
Lymphs Abs: 1.5 10*3/uL (ref 0.7–4.0)
MCHC: 33.2 g/dL (ref 30.0–36.0)
MCV: 97.8 fl (ref 78.0–100.0)
Monocytes Absolute: 0.5 10*3/uL (ref 0.1–1.0)
Monocytes Relative: 9.7 % (ref 3.0–12.0)
Neutro Abs: 3.1 10*3/uL (ref 1.4–7.7)
Neutrophils Relative %: 59.1 % (ref 43.0–77.0)
Platelets: 242 10*3/uL (ref 150.0–400.0)
RBC: 4.24 Mil/uL (ref 3.87–5.11)
RDW: 14 % (ref 11.5–15.5)
WBC: 5.2 10*3/uL (ref 4.0–10.5)

## 2023-10-15 LAB — LIPID PANEL
Cholesterol: 173 mg/dL (ref 0–200)
HDL: 81.2 mg/dL (ref >39.00)
LDL Cholesterol: 83 mg/dL (ref 0–99)
NonHDL: 92.05
Total CHOL/HDL Ratio: 2
Triglycerides: 44 mg/dL (ref 0.0–149.0)
VLDL: 8.8 mg/dL (ref 0.0–40.0)

## 2023-10-15 LAB — COMPREHENSIVE METABOLIC PANEL
ALT: 18 U/L (ref 0–35)
AST: 21 U/L (ref 0–37)
Albumin: 3.9 g/dL (ref 3.5–5.2)
Alkaline Phosphatase: 56 U/L (ref 39–117)
BUN: 28 mg/dL — ABNORMAL HIGH (ref 6–23)
CO2: 28 meq/L (ref 19–32)
Calcium: 8.8 mg/dL (ref 8.4–10.5)
Chloride: 104 meq/L (ref 96–112)
Creatinine, Ser: 0.7 mg/dL (ref 0.40–1.20)
GFR: 89.74 mL/min (ref >60.00)
Glucose, Bld: 94 mg/dL (ref 70–99)
Potassium: 4.2 meq/L (ref 3.5–5.1)
Sodium: 138 meq/L (ref 135–145)
Total Bilirubin: 0.6 mg/dL (ref 0.2–1.2)
Total Protein: 6.4 g/dL (ref 6.0–8.3)

## 2023-10-15 LAB — TSH: TSH: 1.49 u[IU]/mL (ref 0.35–5.50)

## 2023-10-15 NOTE — Assessment & Plan Note (Signed)
 Ghm utd Check labs  See AVS Health Maintenance  Topic Date Due   COVID-19 Vaccine (5 - 2024-25 season) 10/31/2023 (Originally 04/12/2023)   MAMMOGRAM  04/22/2024   Medicare Annual Wellness (AWV)  09/30/2024   DTaP/Tdap/Td (3 - Td or Tdap) 04/03/2027   Colonoscopy  12/17/2027   Pneumonia Vaccine 33+ Years old  Completed   INFLUENZA VACCINE  Completed   DEXA SCAN  Completed   Hepatitis C Screening  Completed   Zoster Vaccines- Shingrix  Completed   HPV VACCINES  Aged Out

## 2023-10-15 NOTE — Patient Instructions (Signed)
 Preventive Care 43 Years and Older, Female Preventive care refers to lifestyle choices and visits with your health care provider that can promote health and wellness. Preventive care visits are also called wellness exams. What can I expect for my preventive care visit? Counseling Your health care provider may ask you questions about your: Medical history, including: Past medical problems. Family medical history. Pregnancy and menstrual history. History of falls. Current health, including: Memory and ability to understand (cognition). Emotional well-being. Home life and relationship well-being. Sexual activity and sexual health. Lifestyle, including: Alcohol, nicotine or tobacco, and drug use. Access to firearms. Diet, exercise, and sleep habits. Work and work Astronomer. Sunscreen use. Safety issues such as seatbelt and bike helmet use. Physical exam Your health care provider will check your: Height and weight. These may be used to calculate your BMI (body mass index). BMI is a measurement that tells if you are at a healthy weight. Waist circumference. This measures the distance around your waistline. This measurement also tells if you are at a healthy weight and may help predict your risk of certain diseases, such as type 2 diabetes and high blood pressure. Heart rate and blood pressure. Body temperature. Skin for abnormal spots. What immunizations do I need?  Vaccines are usually given at various ages, according to a schedule. Your health care provider will recommend vaccines for you based on your age, medical history, and lifestyle or other factors, such as travel or where you work. What tests do I need? Screening Your health care provider may recommend screening tests for certain conditions. This may include: Lipid and cholesterol levels. Hepatitis C test. Hepatitis B test. HIV (human immunodeficiency virus) test. STI (sexually transmitted infection) testing, if you are at  risk. Lung cancer screening. Colorectal cancer screening. Diabetes screening. This is done by checking your blood sugar (glucose) after you have not eaten for a while (fasting). Mammogram. Talk with your health care provider about how often you should have regular mammograms. BRCA-related cancer screening. This may be done if you have a family history of breast, ovarian, tubal, or peritoneal cancers. Bone density scan. This is done to screen for osteoporosis. Talk with your health care provider about your test results, treatment options, and if necessary, the need for more tests. Follow these instructions at home: Eating and drinking  Eat a diet that includes fresh fruits and vegetables, whole grains, lean protein, and low-fat dairy products. Limit your intake of foods with high amounts of sugar, saturated fats, and salt. Take vitamin and mineral supplements as recommended by your health care provider. Do not drink alcohol if your health care provider tells you not to drink. If you drink alcohol: Limit how much you have to 0-1 drink a day. Know how much alcohol is in your drink. In the [place] one drink equals one 12 oz bottle of beer (355 mL), one 5 oz glass of wine (148 mL), or one 1 oz glass of hard liquor (44 mL). Lifestyle Brush your teeth every morning and night with fluoride toothpaste. Floss one time each day. Exercise for at least 30 minutes 5 or more days each week. Do not use any products that contain nicotine or tobacco. These products include cigarettes, chewing tobacco, and vaping devices, such as e-cigarettes. If you need help quitting, ask your health care provider. Do not use drugs. If you are sexually active, practice safe sex. Use a condom or other form of protection in order to prevent STIs. Take aspirin only as told by  your health care provider. Make sure that you understand how much to take and what form to take. Work with your health care provider to find out whether it  is safe and beneficial for you to take aspirin daily. Ask your health care provider if you need to take a cholesterol-lowering medicine (statin). Find healthy ways to manage stress, such as: Meditation, yoga, or listening to music. Journaling. Talking to a trusted person. Spending time with friends and family. Minimize exposure to UV radiation to reduce your risk of skin cancer. Safety Always wear your seat belt while driving or riding in a vehicle. Do not drive: If you have been drinking alcohol. Do not ride with someone who has been drinking. When you are tired or distracted. While texting. If you have been using any mind-altering substances or drugs. Wear a helmet and other protective equipment during sports activities. If you have firearms in your house, make sure you follow all gun safety procedures. What's next? Visit your health care provider once a year for an annual wellness visit. Ask your health care provider how often you should have your eyes and teeth checked. Stay up to date on all vaccines. This information is not intended to replace advice given to you by your health care provider. Make sure you discuss any questions you have with your health care provider. Document Revised: 01/23/2021 Document Reviewed: 01/23/2021 Elsevier Patient Education  2024 [employer].

## 2023-10-15 NOTE — Progress Notes (Signed)
 Established Patient Office Visit  Subjective   Patient ID: [name], female    DOB: 24-Jun-1956  Age: 68 y.o. MRN: [id number]  Chief Complaint  Patient presents with   Annual Exam    Pt states fasting     HPI Discussed the use of AI scribe software for clinical note transcription with the patient, who gave verbal consent to proceed.  History of Present Illness   [name] is a 68 year old female who presents for follow-up after back surgery. She was referred by Dr. Katrinka Blazing for evaluation of her back pain.  She underwent back surgery in December for lumbar stenosis, which involved a laminectomy. Prior to the surgery, she experienced significant pain that prevented her from lying on her back or sides, with throbbing pain in her hip that persisted throughout the night. She received two spinal injections, which did not alleviate her symptoms, leading to the decision to proceed with surgery.  Post-surgery, she reports improvement in her condition, although she initially experienced difficulty climbing stairs. She received a shot for bursitis, which has since resolved her hip pain. She is currently not taking any medications. No current concerns post-surgery and her stomach is okay.  She is up to date with her eye and dental check-ups. She has not received any recent COVID-19 vaccinations. She also mentions a previous breast screening that required additional imaging at [facility], which ultimately showed no concerning findings.      Patient Active Problem List   Diagnosis Date Noted   Lumbar spinal stenosis 07/30/2023   Gluteal tendonitis of right buttock 03/31/2023   Lumbar radiculopathy, right 02/26/2023   Abnormal WBC count 09/29/2022   Need for pneumococcal 20-valent conjugate vaccination 09/29/2022   Preventative health care 09/29/2022   Greater trochanteric bursitis of right hip 09/24/2022   Pansinusitis 09/01/2022   Left carpal tunnel syndrome 07/23/2021    Partial tear of common extensor tendon of elbow 06/07/2020   Leukopenia 05/30/2019   AC joint pain 04/04/2016   Calcific bursitis of shoulder 04/04/2016   Right arm pain 11/22/2015   Sinusitis, acute maxillary 08/02/2014   THYROIDITIS 03/14/2010   ABNORMAL THYROID FUNCTION TESTS 02/27/2010   NUMBNESS, HAND 02/12/2010   EDEMA 02/12/2010   VAGINAL BLEEDING, ABNORMAL 12/14/2008   IRREGULAR MENSES 12/04/2008   ALLERGIC RHINITIS 06/13/2008   UTI 02/18/2008   SINUSITIS- ACUTE-NOS 06/25/2007   PHARYNGITIS, ACUTE 02/16/2007   FOREIGN BODY, FOOT/TOE W/O INFECTION 02/16/2007   Past Medical History:  Diagnosis Date   Allergic rhinitis    Allergy    PONV (postoperative nausea and vomiting)    Past Surgical History:  Procedure Laterality Date   BREAST BIOPSY  11-2000   benign   LUMBAR LAMINECTOMY/ DECOMPRESSION WITH MET-RX Right 07/30/2023   Procedure: MINIMALLY INVASIVE LAMINECTOMY, LATERAL RECESS DECOMPRESSION, LUMBAR FOUR-FIVE;  Surgeon: Dawley, Alan Mulder, DO;  Location: [facility] OR;  Service: Neurosurgery;  Laterality: Right;  3C   OOPHORECTOMY     VAGINAL HYSTERECTOMY     Social History   Tobacco Use   Smoking status: Never   Smokeless tobacco: Never  Vaping Use   Vaping status: Never Used  Substance Use Topics   Alcohol use: Yes    Comment: rare   Drug use: No   Social History   Socioeconomic History   Marital status: Married    Spouse name: Not on file   Number of children: 1   Years of education: Not on file   Highest education  level: Associate degree: occupational, Scientist, product/process development, or vocational program  Occupational History   Occupation: International aid/development worker  Tobacco Use   Smoking status: Never   Smokeless tobacco: Never  Vaping Use   Vaping status: Never Used  Substance and Sexual Activity   Alcohol use: Yes    Comment: rare   Drug use: No   Sexual activity: Yes    Partners: Male  Other Topics Concern   Not on file  Social History Narrative   Exercise- walking a  little at work   Social Drivers of Corporate investment banker Strain: Low Risk  (10/01/2023)   Overall Financial Resource Strain (CARDIA)    Difficulty of Paying Living Expenses: Not hard at all  Food Insecurity: No Food Insecurity (10/01/2023)   Hunger Vital Sign    Worried About Running Out of Food in the Last Year: Never true    Ran Out of Food in the Last Year: Never true  Transportation Needs: No Transportation Needs (10/01/2023)   PRAPARE - Administrator, Civil Service (Medical): No    Lack of Transportation (Non-Medical): No  Physical Activity: Insufficiently Active (10/01/2023)   Exercise Vital Sign    Days of Exercise per Week: 3 days    Minutes of Exercise per Session: 30 min  Stress: No Stress Concern Present (10/01/2023)   [employer] of Occupational Health - Occupational Stress Questionnaire    Feeling of Stress : Not at all  Social Connections: Socially Integrated (10/01/2023)   Social Connection and Isolation Panel [NHANES]    Frequency of Communication with Friends and Family: More than three times a week    Frequency of Social Gatherings with Friends and Family: More than three times a week    Attends Religious Services: More than 4 times per year    Active Member of [employer] or Organizations: Yes    Attends Engineer, structural: More than 4 times per year    Marital Status: Married  Catering manager Violence: Not At Risk (10/01/2023)   Humiliation, Afraid, Rape, and Kick questionnaire    Fear of Current or Ex-Partner: No    Emotionally Abused: No    Physically Abused: No    Sexually Abused: No   Family Status  Relation Name Status   Mother  Deceased at age 60   Father  Deceased at age 35       pacreatitis  No partnership data on file   Family History  Problem Relation Age of Onset   Breast cancer Mother    Hypertension Mother    Stroke Mother    Pancreatitis Father    Allergies  Allergen Reactions   Sulfonamide Derivatives  Swelling   Penicillins Rash      Review of Systems  Constitutional:  Negative for chills, fever and malaise/fatigue.  HENT:  Negative for congestion and hearing loss.   Eyes:  Negative for discharge.  Respiratory:  Negative for cough, sputum production and shortness of breath.   Cardiovascular:  Negative for chest pain, palpitations and leg swelling.  Gastrointestinal:  Negative for abdominal pain, blood in stool, constipation, diarrhea, heartburn, nausea and vomiting.  Genitourinary:  Negative for dysuria, frequency, hematuria and urgency.  Musculoskeletal:  Negative for back pain, falls and myalgias.  Skin:  Negative for rash.  Neurological:  Negative for dizziness, sensory change, loss of consciousness, weakness and headaches.  Endo/Heme/Allergies:  Negative for environmental allergies. Does not bruise/bleed easily.  Psychiatric/Behavioral:  Negative for depression and suicidal ideas.  The patient is not nervous/anxious and does not have insomnia.       Objective:     BP 120/70 (BP Location: Left Arm, Patient Position: Sitting, Cuff Size: Normal)   Pulse 78   Temp 97.8 F (36.6 C) (Oral)   Resp 18   Ht 5\' 4"  (1.626 m)   Wt 183 lb 12.8 oz (83.4 kg)   SpO2 97%   BMI 31.55 kg/m  BP Readings from Last 3 Encounters:  10/15/23 120/70  07/30/23 121/60  07/24/23 115/70   Wt Readings from Last 3 Encounters:  10/15/23 183 lb 12.8 oz (83.4 kg)  10/01/23 180 lb (81.6 kg)  07/30/23 180 lb (81.6 kg)   SpO2 Readings from Last 3 Encounters:  10/15/23 97%  07/30/23 98%  07/24/23 100%      Physical Exam Vitals and nursing note reviewed.  Constitutional:      General: She is not in acute distress.    Appearance: Normal appearance. She is well-developed.  HENT:     Head: Normocephalic and atraumatic.     Right Ear: Tympanic membrane, ear canal and external ear normal. There is no impacted cerumen.     Left Ear: Tympanic membrane, ear canal and external ear normal. There is no  impacted cerumen.     Nose: Nose normal.     Mouth/Throat:     Mouth: Mucous membranes are moist.     Pharynx: Oropharynx is clear. No oropharyngeal exudate or posterior oropharyngeal erythema.  Eyes:     General: No scleral icterus.       Right eye: No discharge.        Left eye: No discharge.     Conjunctiva/sclera: Conjunctivae normal.     Pupils: Pupils are equal, round, and reactive to light.  Neck:     Thyroid: No thyromegaly or thyroid tenderness.     Vascular: No JVD.  Cardiovascular:     Rate and Rhythm: Normal rate and regular rhythm.     Heart sounds: Normal heart sounds. No murmur heard. Pulmonary:     Effort: Pulmonary effort is normal. No respiratory distress.     Breath sounds: Normal breath sounds.  Abdominal:     General: Bowel sounds are normal. There is no distension.     Palpations: Abdomen is soft. There is no mass.     Tenderness: There is no abdominal tenderness. There is no guarding or rebound.  Musculoskeletal:        General: Normal range of motion.     Cervical back: Normal range of motion and neck supple.     Right lower leg: No edema.     Left lower leg: No edema.  Lymphadenopathy:     Cervical: No cervical adenopathy.  Skin:    General: Skin is warm and dry.     Findings: No erythema or rash.  Neurological:     Mental Status: She is alert and oriented to person, place, and time.     Cranial Nerves: No cranial nerve deficit.     Deep Tendon Reflexes: Reflexes are normal and symmetric.  Psychiatric:        Mood and Affect: Mood normal.        Behavior: Behavior normal.        Thought Content: Thought content normal.        Judgment: Judgment normal.      No results found for any visits on 10/15/23.  Last CBC Lab Results  Component Value Date  WBC 5.8 07/24/2023   HGB 13.8 07/24/2023   HCT 40.8 07/24/2023   MCV 96.2 07/24/2023   MCH 32.5 07/24/2023   RDW 13.0 07/24/2023   PLT 242 07/24/2023   Last metabolic panel Lab Results   Component Value Date   GLUCOSE 92 09/29/2022   NA 139 09/29/2022   K 3.9 09/29/2022   CL 104 09/29/2022   CO2 28 09/29/2022   BUN 15 09/29/2022   CREATININE 0.75 09/29/2022   GFR 83.21 09/29/2022   CALCIUM 9.2 09/29/2022   PROT 7.3 09/29/2022   ALBUMIN 4.3 09/29/2022   BILITOT 0.6 09/29/2022   ALKPHOS 59 09/29/2022   AST 20 09/29/2022   ALT 22 09/29/2022   ANIONGAP 5 11/29/2019   Last lipids Lab Results  Component Value Date   CHOL 162 08/29/2021   HDL 63.90 08/29/2021   LDLCALC 85 08/29/2021   TRIG 64.0 08/29/2021   CHOLHDL 3 08/29/2021   Last hemoglobin A1c No results found for: "HGBA1C" Last thyroid functions Lab Results  Component Value Date   TSH 1.57 08/29/2021   Last vitamin D Lab Results  Component Value Date   VD25OH 31 02/13/2010   Last vitamin B12 and Folate Lab Results  Component Value Date   VITAMINB12 407 03/14/2010      The 10-year ASCVD risk score (Arnett DK, et al., 2019) is: 5.3%    Assessment & Plan:   Problem List Items Addressed This Visit       Unprioritized   Abnormal WBC count   Relevant Orders   CBC with Differential/Platelet   Preventative health care - Primary   Relevant Orders   CBC with Differential/Platelet   Comprehensive metabolic panel   Lipid panel   TSH  Assessment and Plan    Lumbar Stenosis   Underwent lumbar laminectomy on July 30, 2023, with significant post-surgical improvement in symptoms, including the ability to lay on her back and sides without pain. Persistent stair-climbing issues were later attributed to bursitis and treated with a corticosteroid injection. Discussed surgical risks and benefits, including potential for immediate relief versus no improvement. Opted for surgery due to severe pain and functional impairment.  Bursitis   Post-laminectomy, experienced pain while climbing stairs, consistent with bursitis. Received a corticosteroid injection, resolving symptoms. Discussed alternative  treatments and potential outcomes before opting for injection.  General Health Maintenance   Up to date with general health maintenance. Regular visits to eye doctor and dentist. Recent visit to Dr. Milton Ferguson. Colonoscopy completed. Tetanus shot not due until 2028. Normal follow-up mammogram at [facility]. Perform annual labs today.        Return in about 1 year (around 10/14/2024), or if symptoms worsen or fail to improve, for fasting, annual exam.    Donato Schultz, DO

## 2023-10-24 ENCOUNTER — Encounter: Payer: Self-pay | Admitting: Family Medicine

## 2023-11-05 ENCOUNTER — Ambulatory Visit: Payer: Medicare Other

## 2023-12-30 NOTE — Progress Notes (Signed)
 [facility] [address] [place] [address] Phone: [phone] Subjective:   Delwyn Filippo, am serving as a scribe for Dr. Ronnell Coins.  I'm seeing this patient by the request  of:  Estill Hemming, DO  CC: Low back and hip pain  [id number]  [name] is a 68 y.o. female coming in with complaint of lumbar spine pain. Surgery with Dr. Julane Ny in December 2024. Here for R hip pain today. Patient states that her back feels good after surgery but she is having hip pain. Had injection from surgeon. Trying to walk for exercise and after 1 mile her pain increases.       Past Medical History:  Diagnosis Date   Allergic rhinitis    Allergy    PONV (postoperative nausea and vomiting)    Past Surgical History:  Procedure Laterality Date   BREAST BIOPSY  11-2000   benign   LUMBAR LAMINECTOMY/ DECOMPRESSION WITH MET-RX Right 07/30/2023   Procedure: MINIMALLY INVASIVE LAMINECTOMY, LATERAL RECESS DECOMPRESSION, LUMBAR FOUR-FIVE;  Surgeon: Dawley, Colby Daub, DO;  Location: [facility] OR;  Service: Neurosurgery;  Laterality: Right;  3C   OOPHORECTOMY     VAGINAL HYSTERECTOMY     Social History   Socioeconomic History   Marital status: Married    Spouse name: Not on file   Number of children: 1   Years of education: Not on file   Highest education level: Associate degree: occupational, Scientist, product/process development, or vocational program  Occupational History   Occupation: International aid/development worker  Tobacco Use   Smoking status: Never   Smokeless tobacco: Never  Vaping Use   Vaping status: Never Used  Substance and Sexual Activity   Alcohol use: Yes    Comment: rare   Drug use: No   Sexual activity: Yes    Partners: Male  Other Topics Concern   Not on file  Social History Narrative   Exercise- walking a little at work   Social Drivers of Corporate investment banker Strain: Low Risk  (10/01/2023)   Overall Financial Resource Strain (CARDIA)    Difficulty of  Paying Living Expenses: Not hard at all  Food Insecurity: No Food Insecurity (10/01/2023)   Hunger Vital Sign    Worried About Running Out of Food in the Last Year: Never true    Ran Out of Food in the Last Year: Never true  Transportation Needs: No Transportation Needs (10/01/2023)   PRAPARE - Administrator, Civil Service (Medical): No    Lack of Transportation (Non-Medical): No  Physical Activity: Insufficiently Active (10/01/2023)   Exercise Vital Sign    Days of Exercise per Week: 3 days    Minutes of Exercise per Session: 30 min  Stress: No Stress Concern Present (10/01/2023)   [employer] of Occupational Health - Occupational Stress Questionnaire    Feeling of Stress : Not at all  Social Connections: Socially Integrated (10/01/2023)   Social Connection and Isolation Panel [NHANES]    Frequency of Communication with Friends and Family: More than three times a week    Frequency of Social Gatherings with Friends and Family: More than three times a week    Attends Religious Services: More than 4 times per year    Active Member of [employer] or Organizations: Yes    Attends Banker Meetings: More than 4 times per year    Marital Status: Married   Allergies  Allergen Reactions   Sulfonamide  Derivatives Swelling   Penicillins Rash   Family History  Problem Relation Age of Onset   Breast cancer Mother    Hypertension Mother    Stroke Mother    Pancreatitis Father       Current Outpatient Medications (Respiratory):    fluticasone  (FLONASE ) 50 MCG/ACT nasal spray, Place 2 sprays into both nostrils daily. (Patient taking differently: Place 2 sprays into both nostrils daily as needed for allergies.)    Current Outpatient Medications (Other):    Hormone Cream Base (HRT BASE) CREA, Apply 1 Application topically as directed. Biest (Estriol/Estradiol ) 80:20 (apply to both arms once daily in the morning)   methocarbamol  (ROBAXIN -750) 750 MG tablet, Take 1  tablet (750 mg total) by mouth 4 (four) times daily. (Patient not taking: Reported on 10/15/2023)   Multiple Vitamin (MULTIVITAMIN WITH MINERALS) TABS tablet, Take 1 tablet by mouth in the morning.   Reviewed prior external information including notes and imaging from  primary care provider As well as notes that were available from care everywhere and other healthcare systems.  Past medical history, social, surgical and family history all reviewed in electronic medical record.  No pertanent information unless stated regarding to the chief complaint.   Review of Systems:  No headache, visual changes, nausea, vomiting, diarrhea, constipation, dizziness, abdominal pain, skin rash, fevers, chills, night sweats, weight loss, swollen lymph nodes, body aches, joint swelling, chest pain, shortness of breath, mood changes. POSITIVE muscle aches  Objective  There were no vitals taken for this visit.   General: No apparent distress alert and oriented x3 mood and affect normal, dressed appropriately.  HEENT: Pupils equal, extraocular movements intact  Respiratory: Patient's speak in full sentences and does not appear short of breath  Cardiovascular: No lower extremity edema, non tender, no erythema  Hip exam shows severe tenderness to palpation over the greater trochanteric area.  Patient has a positive FABER test.  Negative straight leg test noted.   Procedure: Real-time Ultrasound Guided Injection of right greater trochanteric bursitis secondary to patient's body habitus Device: GE Logiq Q7 Ultrasound guided injection is preferred based studies that show increased duration, increased effect, greater accuracy, decreased procedural pain, increased response rate, and decreased cost with ultrasound guided versus blind injection.  Verbal informed consent obtained.  Time-out conducted.  Noted no overlying erythema, induration, or other signs of local infection.  Skin prepped in a sterile fashion.  Local  anesthesia: Topical Ethyl chloride.  With sterile technique and under real time ultrasound guidance:  Greater trochanteric area was visualized and patient's bursa was noted. A 22-gauge 3 inch needle was inserted and 4 cc of 0.5% Marcaine  and 1 cc of Kenalog  40 mg/dL was injected. Pictures taken Completed without difficulty  Pain immediately resolved suggesting accurate placement of the medication.  Advised to call if fevers/chills, erythema, induration, drainage, or persistent bleeding.  Images permanently stored  Impression: Technically successful ultrasound guided injection.    Impression and Recommendations:    The above documentation has been reviewed and is accurate and complete Pamela Patchell M Zhanna Melin, DO

## 2023-12-31 ENCOUNTER — Ambulatory Visit: Payer: Medicare Other | Admitting: Family Medicine

## 2023-12-31 ENCOUNTER — Other Ambulatory Visit: Payer: Self-pay

## 2023-12-31 ENCOUNTER — Encounter: Payer: Self-pay | Admitting: Family Medicine

## 2023-12-31 VITALS — BP 110/70 | HR 70 | Ht 64.0 in | Wt 183.0 lb

## 2023-12-31 DIAGNOSIS — M25551 Pain in right hip: Secondary | ICD-10-CM

## 2023-12-31 DIAGNOSIS — M7061 Trochanteric bursitis, right hip: Secondary | ICD-10-CM

## 2023-12-31 NOTE — Assessment & Plan Note (Signed)
 Chronic, with exacerbation, last injection gave her nearly 13 months of improvement.  Hopefully this will continue to give the same type of improvement.  Discussed icing regimen of home exercises, discussed which activities to do and which ones to avoid.  Increase activity slowly.  Follow-up again in 6 to 8 weeks.

## 2023-12-31 NOTE — Patient Instructions (Signed)
 Injected R hip See me again in

## 2024-06-02 DIAGNOSIS — Z01419 Encounter for gynecological examination (general) (routine) without abnormal findings: Secondary | ICD-10-CM | POA: Diagnosis not present

## 2024-06-02 DIAGNOSIS — Z1231 Encounter for screening mammogram for malignant neoplasm of breast: Secondary | ICD-10-CM | POA: Diagnosis not present

## 2024-06-02 DIAGNOSIS — Z6832 Body mass index (BMI) 32.0-32.9, adult: Secondary | ICD-10-CM | POA: Diagnosis not present

## 2024-06-08 ENCOUNTER — Other Ambulatory Visit: Payer: Self-pay | Admitting: Obstetrics and Gynecology

## 2024-06-08 DIAGNOSIS — R928 Other abnormal and inconclusive findings on diagnostic imaging of breast: Secondary | ICD-10-CM

## 2024-06-21 ENCOUNTER — Ambulatory Visit
Admission: RE | Admit: 2024-06-21 | Discharge: 2024-06-21 | Disposition: A | Source: Ambulatory Visit | Attending: Obstetrics and Gynecology | Admitting: Obstetrics and Gynecology

## 2024-06-21 ENCOUNTER — Ambulatory Visit
Admission: RE | Admit: 2024-06-21 | Discharge: 2024-06-21 | Disposition: A | Discharge status: Home / Self Care | Source: Ambulatory Visit | Attending: Obstetrics and Gynecology | Admitting: Obstetrics and Gynecology

## 2024-06-21 DIAGNOSIS — N6489 Other specified disorders of breast: Secondary | ICD-10-CM | POA: Diagnosis not present

## 2024-06-21 DIAGNOSIS — R928 Other abnormal and inconclusive findings on diagnostic imaging of breast: Secondary | ICD-10-CM

## 2024-06-28 ENCOUNTER — Encounter: Payer: Self-pay | Admitting: Family Medicine

## 2024-07-25 ENCOUNTER — Other Ambulatory Visit: Payer: Self-pay | Admitting: Surgery

## 2024-07-26 DIAGNOSIS — D225 Melanocytic nevi of trunk: Secondary | ICD-10-CM | POA: Diagnosis not present

## 2024-07-26 DIAGNOSIS — D2261 Melanocytic nevi of right upper limb, including shoulder: Secondary | ICD-10-CM | POA: Diagnosis not present

## 2024-07-26 DIAGNOSIS — L820 Inflamed seborrheic keratosis: Secondary | ICD-10-CM | POA: Diagnosis not present

## 2024-07-26 DIAGNOSIS — L821 Other seborrheic keratosis: Secondary | ICD-10-CM | POA: Diagnosis not present

## 2024-10-06 ENCOUNTER — Ambulatory Visit: Payer: Medicare Other
